# Patient Record
Sex: Male | Born: 1945
Health system: Southern US, Community
[De-identification: ages and names within clinical notes are randomized; demographics above are authoritative.]

## PROBLEM LIST (undated history)

## (undated) DIAGNOSIS — I10 Essential (primary) hypertension: Secondary | ICD-10-CM

## (undated) DIAGNOSIS — J189 Pneumonia, unspecified organism: Secondary | ICD-10-CM

## (undated) DIAGNOSIS — K2270 Barrett's esophagus without dysplasia: Secondary | ICD-10-CM

## (undated) DIAGNOSIS — N529 Male erectile dysfunction, unspecified: Secondary | ICD-10-CM

## (undated) DIAGNOSIS — K219 Gastro-esophageal reflux disease without esophagitis: Secondary | ICD-10-CM

## (undated) DIAGNOSIS — I340 Nonrheumatic mitral (valve) insufficiency: Secondary | ICD-10-CM

## (undated) DIAGNOSIS — E785 Hyperlipidemia, unspecified: Secondary | ICD-10-CM

## (undated) DIAGNOSIS — Z9889 Other specified postprocedural states: Secondary | ICD-10-CM

## (undated) HISTORY — PX: COLONOSCOPY: SHX174

## (undated) HISTORY — PX: CARDIAC CATHETERIZATION: SHX172

---

## 1998-09-30 DIAGNOSIS — J189 Pneumonia, unspecified organism: Secondary | ICD-10-CM

## 1998-09-30 HISTORY — DX: Pneumonia, unspecified organism: J18.9

## 2000-09-30 DIAGNOSIS — K2270 Barrett's esophagus without dysplasia: Secondary | ICD-10-CM

## 2000-09-30 HISTORY — DX: Barrett's esophagus without dysplasia: K22.70

## 2012-11-27 ENCOUNTER — Other Ambulatory Visit: Payer: Self-pay | Admitting: Cardiology

## 2012-12-01 NOTE — H&P (Signed)
Patient: James [name], James [name] Provider: Donato Schultz, MD  DOB: [birth year] Age: 67 Y Sex: Male Date: 11/26/2012  Phone: [phone]   Address: [address] , [place], [place]  Pcp: Jonny Ruiz GRIFFIN       Subjective:     CC:    1. MS/echo f/u.        HPI:  General:  67 year old with severe mitral valve prolapse, posterior leaflet, moderate to severe mitral regurgitation anterior directed jet with normal EF, moderately to severely dilated left atrium here for followup discussion of echocardiogram. Based upon these findings, I do want to perform transesophageal echocardiogram. See below. We have gone over risks and benefits such as esophageal perforation. I would also like to discuss further mitral valve repair as we did today with surgery. He still is able to walk golf course without any difficulty. His wife is here with him and does state that he has been somewhat more fatigued recently, goes to bed early. He's not having excessive dyspnea. He occasionally will feel fluttering sensation. He has not been diagnosed previously with atrial fibrillation. No prior strokes..        Medical History: Hypertension, Hyperlipidemia, chronic gastroesophageal reflux disease with Barrett's esophagitis, diagnosed 2009, Mitral valve prolapse with regurgitation, Erectile dysfunction.        Family History: Father: deceased 85 yrs choked Mother: deceased 19 yrs hypertension, CHF, COPD Brother 1: alive Sister 1: deceased domestic violence 2 son(s) , 1 daughter(s) .        Social History:  General:  History of smoking  cigarettes: Never smoked no Smoking.  Alcohol: 4 beer/wine a day.  Exercise: walk and hike and walks golf course.  Occupation: retired, Armed forces logistics/support/administrative officer.  Marital Status: Married.  Grandkids in [place]. Now have townhome in [place].       Medications: Prilosec OTC 20 MG Tablet Delayed Release 1 tablet Once a day, Multivitamin . Tablet 1 tablet Once daily, Fish Oil 1000  MG Capsule 1 capsule with a meal Once a day, Co Q-10 200 MG Capsule 1 capsule with a meal Once a day, Viagra 50 MG Tablet 1 tablet as needed Once a day, Amlodipine Besylate 10 MG Tablet 1 tablet Once a day, Benazepril HCl 20 MG Tablet 1 tablet once a day, Hydrochlorothiazide 12.5 MG Capsule 1 capsule Once a day, Simvastatin 20 MG Tablet 1 tablet every evening Once a day, Aspirin 81 MG Tablet Chewable 1 tablet Once a day, Medication List reviewed and reconciled with the patient       Allergies: N.K.D.A.      Objective:     Vitals: Wt 190.2, Wt change .2 lb, Ht 71, BMI 26.52, Pulse sitting 78, BP sitting 134/74.       Examination:  Cardiology, General:  GENERAL APPEARANCE: pleasant, NAD.  JVD: flat.  HEART SOUNDS: regular, normal S1, S2, no S3 or S4.  MURMUR: 3/6 HSM apex.        Assessment:     Assessment:  1. Mitral valve regurgitation - 424.0 (Primary)  2. Hypertension, essential - 401.9  3. Hyperlipidemia - 272.4    Plan:     1. Mitral valve regurgitation  LAB: Basic Metabolic Creat 1.08    GLUCOSE 94 70-99 - mg/dL    BUN 20 4-78 - mg/dL    CREATININE 2.95 6.21-3.08 - mg/dl    eGFR (NON-AFRICAN AMERICAN) 68 >60 - calc    eGFR (AFRICAN AMERICAN) 83 >60 - calc    SODIUM 137 136-145 -  mmol/L    POTASSIUM 3.6 3.5-5.5 - mmol/L    CHLORIDE 102 98-107 - mmol/L    C02 26 22-32 - mmol/L    ANION GAP 12.9 6.0-20.0 - mmol/L    CALCIUM 9.4 8.6-10.3 - mg/dL     [facility] 16/07/9603 05:40:08 PM > ok for TEE     LAB: CBC with Diff Hg 14.2     WBC 6.4 4.0-11.0 - K/ul     RBC 4.16 4.20-5.80 - M/uL L    HGB 14.2 13.0-17.0 - g/dL     HCT 54.0 98.1-19.1 - %     MCH 34.1 27.0-33.0 - pg H    MPV 10.2 7.5-10.7 - fL     MCV 95.9 80.0-94.0 - fL H    MCHC 35.6 32.0-36.0 - g/dL     RDW 47.8 29.5-62.1 - %     NRBC# 0.02 -     PLT 215 150-400 - K/uL     NEUT % 69.7 43.3-71.9 - %     NRBC% 0.30 - %     LYMPH% 23.8 16.8-43.5 - %     MONO % 5.1 4.6-12.4 - %     EOS % 0.8 0.0-7.8 -  %     BASO % 0.6 0.0-1.0 - %     NEUT # 4.5 1.9-7.2 - K/uL     LYMPH# 1.50 1.10-2.70 - K/uL     MONO # 0.3 0.3-0.8 - K/uL     EOS # 0.1 0.0-0.6 - K/uL     BASO # 0.0 0.0-0.1 - K/uL      SKAINS,MARK 11/26/2012 05:39:54 PM > ok for TEE     LAB: PT (Prothrombin Time) (308657) normal     Prothrombin Time 10.0 9.1-12.0 - SEC     INR 0.9 0.8-1.2 -      SKAINS,MARK 11/27/2012 04:41:27 PM >      I decided to proceed with transesophageal echocardiogram to further illustrate mitral valve. Based upon TEE, we will likely have him visit with Dr. Cornelius Moras of cardiothoracic surgery. Prolapse is severe, regurgitation is moderate to severe.          Procedures:  Venipuncture:  Venipuncture: Smith,Michele 11/26/2012 02:21:40 PM > , performed in right arm.        Immunizations:        Labs:        Procedure Codes: 84696 ECL BMP, 85025 ECL CBC PLATELET DIFF, 29528 BLOOD COLLECTION ROUTINE VENIPUNCTURE       Preventive:         Follow Up: 3 Months      Provider: Donato Schultz, MD  Patient: James [name], James [name] DOB: 10-13-1945 Date: 11/26/2012

## 2012-12-03 ENCOUNTER — Ambulatory Visit (HOSPITAL_COMMUNITY)
Admission: RE | Admit: 2012-12-03 | Discharge: 2012-12-03 | Disposition: A | Discharge status: Home / Self Care | Payer: Medicare Other | Source: Ambulatory Visit | Attending: Cardiology | Admitting: Cardiology

## 2012-12-03 ENCOUNTER — Encounter (HOSPITAL_COMMUNITY): Payer: Self-pay | Admitting: *Deleted

## 2012-12-03 ENCOUNTER — Encounter (HOSPITAL_COMMUNITY)
Admission: RE | Disposition: A | Discharge status: Home / Self Care | Payer: Self-pay | Source: Ambulatory Visit | Attending: Cardiology

## 2012-12-03 DIAGNOSIS — K219 Gastro-esophageal reflux disease without esophagitis: Secondary | ICD-10-CM

## 2012-12-03 DIAGNOSIS — I10 Essential (primary) hypertension: Secondary | ICD-10-CM

## 2012-12-03 DIAGNOSIS — I059 Rheumatic mitral valve disease, unspecified: Secondary | ICD-10-CM | POA: Diagnosis present

## 2012-12-03 DIAGNOSIS — K2270 Barrett's esophagus without dysplasia: Secondary | ICD-10-CM

## 2012-12-03 DIAGNOSIS — I340 Nonrheumatic mitral (valve) insufficiency: Secondary | ICD-10-CM

## 2012-12-03 DIAGNOSIS — E785 Hyperlipidemia, unspecified: Secondary | ICD-10-CM

## 2012-12-03 HISTORY — DX: Male erectile dysfunction, unspecified: N52.9

## 2012-12-03 HISTORY — DX: Hyperlipidemia, unspecified: E78.5

## 2012-12-03 HISTORY — DX: Gastro-esophageal reflux disease without esophagitis: K21.9

## 2012-12-03 HISTORY — DX: Barrett's esophagus without dysplasia: K22.70

## 2012-12-03 HISTORY — DX: Nonrheumatic mitral (valve) insufficiency: I34.0

## 2012-12-03 HISTORY — DX: Essential (primary) hypertension: I10

## 2012-12-03 HISTORY — PX: TEE WITHOUT CARDIOVERSION: SHX5443

## 2012-12-03 SURGERY — ECHOCARDIOGRAM, TRANSESOPHAGEAL
Anesthesia: Moderate Sedation

## 2012-12-03 MED ORDER — LIDOCAINE VISCOUS 2 % MT SOLN
OROMUCOSAL | Status: AC
  Filled 2012-12-03: qty 15

## 2012-12-03 MED ORDER — FENTANYL CITRATE 0.05 MG/ML IJ SOLN
INTRAMUSCULAR | Status: DC | PRN
  Administered 2012-12-03: 50 ug via INTRAVENOUS
  Administered 2012-12-03: 25 ug via INTRAVENOUS

## 2012-12-03 MED ORDER — FENTANYL CITRATE 0.05 MG/ML IJ SOLN
INTRAMUSCULAR | Status: AC
  Filled 2012-12-03: qty 2

## 2012-12-03 MED ORDER — BUTAMBEN-TETRACAINE-BENZOCAINE 2-2-14 % EX AERO
INHALATION_SPRAY | CUTANEOUS | Status: DC | PRN
  Administered 2012-12-03: 2 via TOPICAL

## 2012-12-03 MED ORDER — MIDAZOLAM HCL 5 MG/ML IJ SOLN
INTRAMUSCULAR | Status: AC
  Filled 2012-12-03: qty 2

## 2012-12-03 MED ORDER — MIDAZOLAM HCL 10 MG/2ML IJ SOLN
INTRAMUSCULAR | Status: DC | PRN
  Administered 2012-12-03 (×2): 2 mg via INTRAVENOUS

## 2012-12-03 MED ORDER — SODIUM CHLORIDE 0.9 % IV SOLN
INTRAVENOUS | Status: DC
  Administered 2012-12-03: 10:00:00 via INTRAVENOUS

## 2012-12-03 MED ORDER — LIDOCAINE VISCOUS 2 % MT SOLN
OROMUCOSAL | Status: DC | PRN
  Administered 2012-12-03: 10 mL via OROMUCOSAL

## 2012-12-03 NOTE — Progress Notes (Signed)
*  PRELIMINARY RESULTS* Echocardiogram TEE has been performed.  James [name] 12/03/2012, 11:22 AM

## 2012-12-03 NOTE — H&P (View-Only) (Signed)
  Patient: James [name], James [name] Provider: Mark Skains, MD  DOB: 12/03/1945 Age: 67 Y Sex: Male Date: 11/26/2012  Phone: [phone]   Address: [address] , [place], [place]  Pcp: JOHN GRIFFIN       Subjective:     CC:    1. MS/echo f/u.        HPI:  General:  67-year-old with severe mitral valve prolapse, posterior leaflet, moderate to severe mitral regurgitation anterior directed jet with normal EF, moderately to severely dilated left atrium here for followup discussion of echocardiogram. Based upon these findings, I do want to perform transesophageal echocardiogram. See below. We have gone over risks and benefits such as esophageal perforation. I would also like to discuss further mitral valve repair as we did today with surgery. He still is able to walk golf course without any difficulty. His wife is here with him and does state that he has been somewhat more fatigued recently, goes to bed early. He's not having excessive dyspnea. He occasionally will feel fluttering sensation. He has not been diagnosed previously with atrial fibrillation. No prior strokes..        Medical History: Hypertension, Hyperlipidemia, chronic gastroesophageal reflux disease with Barrett's esophagitis, diagnosed 2009, Mitral valve prolapse with regurgitation, Erectile dysfunction.        Family History: Father: deceased 69 yrs choked Mother: deceased 78 yrs hypertension, CHF, COPD Brother 1: alive Sister 1: deceased domestic violence 2 son(s) , 1 daughter(s) .        Social History:  General:  History of smoking  cigarettes: Never smoked no Smoking.  Alcohol: 4 beer/wine a day.  Exercise: walk and hike and walks golf course.  Occupation: retired, sales textile yarns.  Marital Status: Married.  Grandkids in [place]. Now have townhome in [place].       Medications: Prilosec OTC 20 MG Tablet Delayed Release 1 tablet Once a day, Multivitamin . Tablet 1 tablet Once daily, Fish Oil 1000  MG Capsule 1 capsule with a meal Once a day, Co Q-10 200 MG Capsule 1 capsule with a meal Once a day, Viagra 50 MG Tablet 1 tablet as needed Once a day, Amlodipine Besylate 10 MG Tablet 1 tablet Once a day, Benazepril HCl 20 MG Tablet 1 tablet once a day, Hydrochlorothiazide 12.5 MG Capsule 1 capsule Once a day, Simvastatin 20 MG Tablet 1 tablet every evening Once a day, Aspirin 81 MG Tablet Chewable 1 tablet Once a day, Medication List reviewed and reconciled with the patient       Allergies: N.[name].D.A.      Objective:     Vitals: Wt 190.2, Wt change .2 lb, Ht 71, BMI 26.52, Pulse sitting 78, BP sitting 134/74.       Examination:  Cardiology, General:  GENERAL APPEARANCE: pleasant, NAD.  JVD: flat.  HEART SOUNDS: regular, normal S1, S2, no S3 or S4.  MURMUR: 3/6 HSM apex.        Assessment:     Assessment:  1. Mitral valve regurgitation - 424.0 (Primary)  2. Hypertension, essential - 401.9  3. Hyperlipidemia - 272.4    Plan:     1. Mitral valve regurgitation  LAB: Basic Metabolic Creat 1.08    GLUCOSE 94 70-99 - mg/dL    BUN 20 6-26 - mg/dL    CREATININE 1.08 0.60-1.30 - mg/dl    eGFR (NON-AFRICAN AMERICAN) 68 >60 - calc    eGFR (AFRICAN AMERICAN) 83 >60 - calc    SODIUM 137 136-145 -   mmol/L    POTASSIUM 3.6 3.5-5.5 - mmol/L    CHLORIDE 102 98-107 - mmol/L    C02 26 22-32 - mmol/L    ANION GAP 12.9 6.0-20.0 - mmol/L    CALCIUM 9.4 8.6-10.3 - mg/dL     SKAINS,MARK 11/26/2012 05:40:08 PM > ok for TEE     LAB: CBC with Diff Hg 14.2     WBC 6.4 4.0-11.0 - [name]/ul     RBC 4.16 4.20-5.80 - M/uL L    HGB 14.2 13.0-17.0 - g/dL     HCT 39.9 39.0-52.0 - %     MCH 34.1 27.0-33.0 - pg H    MPV 10.2 7.5-10.7 - fL     MCV 95.9 80.0-94.0 - fL H    MCHC 35.6 32.0-36.0 - g/dL     RDW 12.4 11.5-15.5 - %     NRBC# 0.02 -     PLT 215 150-400 - [name]/uL     NEUT % 69.7 43.3-71.9 - %     NRBC% 0.30 - %     LYMPH% 23.8 16.8-43.5 - %     MONO % 5.1 4.6-12.4 - %     EOS % 0.8 0.0-7.8 -  %     BASO % 0.6 0.0-1.0 - %     NEUT # 4.5 1.9-7.2 - [name]/uL     LYMPH# 1.50 1.10-2.70 - [name]/uL     MONO # 0.3 0.3-0.8 - [name]/uL     EOS # 0.1 0.0-0.6 - [name]/uL     BASO # 0.0 0.0-0.1 - [name]/uL      SKAINS,MARK 11/26/2012 05:39:54 PM > ok for TEE     LAB: PT (Prothrombin Time) (005199) normal     Prothrombin Time 10.0 9.1-12.0 - SEC     INR 0.9 0.8-1.2 -      SKAINS,MARK 11/27/2012 04:41:27 PM >      I decided to proceed with transesophageal echocardiogram to further illustrate mitral valve. Based upon TEE, we will likely have him visit with Dr. Owen of cardiothoracic surgery. Prolapse is severe, regurgitation is moderate to severe.          Procedures:  Venipuncture:  Venipuncture: Smith,Michele 11/26/2012 02:21:40 PM > , performed in right arm.        Immunizations:        Labs:        Procedure Codes: 80048 ECL BMP, 85025 ECL CBC PLATELET DIFF, 36415 BLOOD COLLECTION ROUTINE VENIPUNCTURE       Preventive:         Follow Up: 3 Months      Provider: Mark Skains, MD  Patient: Schowalter, Cleotis [name] DOB: 06/24/1946 Date: 11/26/2012    

## 2012-12-03 NOTE — Interval H&P Note (Signed)
History and Physical Interval Note:  12/03/2012 10:44 AM  James [name]  has presented today for surgery, with the diagnosis of Mitral Reguritation  The various methods of treatment have been discussed with the patient and family. After consideration of risks, benefits and other options for treatment, the patient has consented to  Procedure(s) with comments: TRANSESOPHAGEAL ECHOCARDIOGRAM (TEE) (N/A) - h/p in file cabinet-ja as a surgical intervention .  The patient's history has been reviewed, patient examined, no change in status, stable for surgery.  I have reviewed the patient's chart and labs.  Questions were answered to the patient's satisfaction.     SKAINS, MARK

## 2012-12-03 NOTE — CV Procedure (Signed)
TEE  Severe mitral regurgitation. Posterior severe prolapse with severe MR, eccentric jet. Ruptured chordae noted. Wraps around entire left atrium. Flow is noted into left atrial appendage.   Normal EF.  Moderate LA enlargement. 5.3cm.   Will discuss with TCTS.

## 2012-12-04 ENCOUNTER — Other Ambulatory Visit: Payer: Self-pay

## 2012-12-04 ENCOUNTER — Encounter: Payer: Self-pay | Admitting: Thoracic Surgery (Cardiothoracic Vascular Surgery)

## 2012-12-04 ENCOUNTER — Encounter: Payer: Medicare Other | Admitting: Thoracic Surgery (Cardiothoracic Vascular Surgery)

## 2012-12-04 ENCOUNTER — Institutional Professional Consult (permissible substitution) (INDEPENDENT_AMBULATORY_CARE_PROVIDER_SITE_OTHER): Payer: Medicare Other | Admitting: Thoracic Surgery (Cardiothoracic Vascular Surgery)

## 2012-12-04 VITALS — BP 145/82 | HR 78 | Resp 20 | Ht 71.0 in | Wt 185.0 lb

## 2012-12-04 DIAGNOSIS — I712 Thoracic aortic aneurysm, without rupture, unspecified: Secondary | ICD-10-CM

## 2012-12-04 DIAGNOSIS — I059 Rheumatic mitral valve disease, unspecified: Secondary | ICD-10-CM

## 2012-12-04 NOTE — Progress Notes (Signed)
[address]            James [name] [address]          [phone]     CARDIOTHORACIC SURGERY CONSULTATION REPORT  Referring Provider is James Schultz, MD PCP is James [place], MD  Chief Complaint  Patient presents with  . Mitral Regurgitation    Referral from [name] for surgical eval on severe MR, 2D- ECHO 11/19/12 , TEE 12/03/12    HPI:  Patient is a 67 year old retired white male who has been fairly healthy most of his life but was first told he had a heart murmur more than 35 years ago. He was diagnosed with mitral valve prolapse and mitral regurgitation several years ago, and up until recently he had been followed in [place] with regular echocardiograms. Approximately one year ago he was seen by his cardiologist in [place] and told that the severity of mitral regurgitation have gotten worse and that the size of the heart had also increased. The patient has recently retired and moved [place] and now established care locally with Dr. Valentina [name] and Dr. Anne [name]. Followup transthoracic echocardiogram was performed demonstrating mitral valve prolapse with severe mitral regurgitation. The patient subsequently underwent transesophageal echocardiogram confirmed the presence of severe mitral regurgitation with flail scallop of the posterior leaflet. Left ventricular size and systolic function remains normal. The patient has been referred for elective surgical consultation.  The patient remains relatively active physically. He specifically denies any exertional shortness of breath. He enjoys golf and regularly walks 18 holes of golf caring his own bag. He also enjoys hiking. He has not appreciated any change in his exercise tolerance. He specifically denies any resting shortness of breath, PND, orthopnea, or lower extremity edema. He has not had any chest pains or chest tightness either with activity or at rest. He does have occasional palpitations. He has no  documented history of irregular heart rhythm or atrial fibrillation.  Past Medical History  Diagnosis Date  . Hypertension   . GERD (gastroesophageal reflux disease)   . Barrett esophagus 2002  . Severe mitral regurgitation   . Hyperlipidemia   . Erectile dysfunction     History reviewed. No pertinent past surgical history.  Family History  Problem Relation Age of Onset  . Hypertension Mother   . COPD Mother   . Heart failure Mother     History   Social History  . Marital Status: Married    Spouse Name: N/A    Number of Children: 3  . Years of Education: N/A   Occupational History  . retired     Publishing copy   Social History Main Topics  . Smoking status: Never Smoker   . Smokeless tobacco: Never Used  . Alcohol Use: 12.6 oz/week    7 Glasses of wine, 14 Cans of beer per week  . Drug Use: Not on file  . Sexually Active: Not on file   Other Topics Concern  . Not on file   Social History Narrative  . No narrative on file    No current facility-administered medications for this visit.   No current outpatient prescriptions on file.   Facility-Administered Medications Ordered in Other Visits  Medication Dose Route Frequency Provider Last Rate Last Dose  . 0.9 %  sodium chloride infusion   Intravenous Continuous James Schultz, MD 10 mL/hr at 12/03/12 [phone]  No Known Allergies    Review of Systems:   General:  normal appetite, normal energy, no weight gain, no weight loss, no fever  Cardiac:  no chest pain with exertion, no chest pain at rest, no SOB with exertion, no resting SOB, no PND, no orthopnea, + palpitations, no arrhythmia, no atrial fibrillation, no LE edema, no dizzy spells, no syncope  Respiratory:  no shortness of breath, no home oxygen, no productive cough, no dry cough, no bronchitis, no wheezing, no hemoptysis, no asthma, no pain with inspiration or cough, no sleep apnea, no CPAP at night  GI:   no difficulty swallowing, + reflux, no  frequent heartburn, no hiatal hernia, no abdominal pain, no constipation, no diarrhea, no hematochezia, no hematemesis, no melena  GU:   no dysuria,  no frequency, no urinary tract infection, no hematuria, no enlarged prostate, no kidney stones, no kidney disease  Vascular:  no pain suggestive of claudication, no pain in feet, no leg cramps, no varicose veins, no DVT, no non-healing foot ulcer  Neuro:   no stroke, no TIA's, no seizures, no headaches, no temporary blindness one eye,  no slurred speech, no peripheral neuropathy, no chronic pain, no instability of gait, no memory/cognitive dysfunction  Musculoskeletal: no arthritis, no joint swelling, no myalgias, no difficulty walking, normal mobility   Skin:   no rash, no itching, no skin infections, no pressure sores or ulcerations  Psych:   no anxiety, no depression, no nervousness, no unusual recent stress  Eyes:   no blurry vision, no floaters, no recent vision changes,  wears glasses or contacts  ENT:   no hearing loss, no loose or painful teeth, no dentures, last saw dentist within the past year  Hematologic:  no easy bruising, no abnormal bleeding, no clotting disorder, no frequent epistaxis  Endocrine:  no diabetes, does not check CBG's at home     Physical Exam:   BP 145/82  Pulse 78  Resp 20  Ht 5\' 11"  (1.803 m)  Wt 185 lb (83.915 kg)  BMI 25.81 kg/m2  SpO2 98%  General:  Healthy, well-appearing  HEENT:  Unremarkable   Neck:   no JVD, no bruits, no adenopathy   Chest:   clear to auscultation, symmetrical breath sounds, no wheezes, no rhonchi   CV:   RRR, prominent grade IV/VI holosystolic murmur best at apex w/ radiation across the precordium and to the back  Abdomen:  soft, non-tender, no masses   Extremities:  warm, well-perfused, pulses palpable throughout, no LE edema  Rectal/GU  Deferred  Neuro:   Grossly non-focal and symmetrical throughout  Skin:   Clean and dry, no rashes, no breakdown   Diagnostic  Tests:  TRANSTHORACIC ECHOCARDIOGRAM  Report of transthoracic echocardiogram performed at [facility] cardiology on 11/19/2012 is reviewed. There is mitral valve prolapse severe prolapse involving the posterior leaflet. There is moderate to severe mitral regurgitation directed anteriorly. There is mild tricuspid regurgitation. Left ventricular systolic function is normal with ejection fraction estimated 60-65%. There is moderate concentric left ventricular hypertrophy. There is mildly elevated right ventricular systolic pressure. There is mild aortic root dilatation with the sinus of Valsalva measuring 3.9 cm. There is mild calcification of aortic valve.    Transesophageal Echocardiography  Patient: James [name], James [name] MR #: [id number] Study Date: 12/03/2012 Gender: M Age: 52 Height: 154.9cm Weight: 84.1kg BSA: 1.38m^2 Pt. Status: Room: [facility], Ec ORDERING Skains, Mark SONOGRAPHER [place], [place] cc:  ------------------------------------------------------------ LV EF: 65% - 70%  ------------------------------------------------------------  Study Conclusions  - Left ventricle: The cavity size was normal. Wall thickness was normal. Systolic function was vigorous. The estimated ejection fraction was in the range of 65% to 70%. - Aortic valve: No evidence of vegetation. - Mitral valve: Severe prolapse, involving the medial scallop of the posterior leaflet with evidence of ruptured chordae. Severe regurgitation directed eccentrically and anteriorly. - Left atrium: The atrium was moderately (5.3cm) dilated. No evidence of thrombus in the atrial cavity or appendage. No evidence of thrombus in the appendage. No evidence of thrombus in the appendage. - Right atrium: No evidence of thrombus in the atrial cavity or appendage. - Atrial septum: No defect or patent foramen ovale was identified. - Tricuspid valve: No evidence of vegetation. - Pulmonic valve: No  evidence of vegetation. - Superior vena cava: The study excluded a thrombus. Transesophageal echocardiography. 2D and color Doppler. Height: Height: 154.9cm. Height: 61in. Weight: Weight: 84.1kg. Weight: 185lb. Body mass index: BMI: 35kg/m^2. Body surface area: BSA: 1.16m^2. Blood pressure: 145/73. Patient status: Inpatient. Location: Endoscopy.  ------------------------------------------------------------  ------------------------------------------------------------ Left ventricle: The cavity size was normal. Wall thickness was normal. Systolic function was vigorous. The estimated ejection fraction was in the range of 65% to 70%.  ------------------------------------------------------------ Aortic valve: Structurally normal valve. Cusp separation was normal. No evidence of vegetation. Doppler: No regurgitation.  ------------------------------------------------------------ Aorta: The aorta was normal, not dilated, and non-diseased.  ------------------------------------------------------------ Mitral valve: Severe prolapse, involving the medial scallop of the posterior leaflet with evidence of ruptured chordae. Doppler: Severe regurgitation directed eccentrically and anteriorly. Regurgitant jet fills left atrial appendage.  ------------------------------------------------------------ Left atrium: The atrium was moderately (5.3cm) dilated. No evidence of thrombus in the atrial cavity or appendage. No evidence of thrombus in the appendage. No evidence of thrombus in the appendage. The appendage was of normal size.  ------------------------------------------------------------ Atrial septum: No defect or patent foramen ovale was identified.  ------------------------------------------------------------ Right ventricle: The cavity size was normal. Wall thickness was normal. Systolic function was normal.  ------------------------------------------------------------ Pulmonic valve:  Structurally normal valve. Cusp separation was normal. No evidence of vegetation.  ------------------------------------------------------------ Tricuspid valve: Structurally normal valve. Leaflet separation was normal. No evidence of vegetation. Doppler: Trivial regurgitation.  ------------------------------------------------------------ Right atrium: The atrium was normal in size. No evidence of thrombus in the atrial cavity or appendage.  ------------------------------------------------------------ Pericardium: The pericardium was normal in appearance. There was no pericardial effusion.  ------------------------------------------------------------ Systemic veins: Superior vena cava: The study excluded a thrombus.  ------------------------------------------------------------ Post procedure conclusions Ascending Aorta:  - The aorta was normal, not dilated, and non-diseased.  ------------------------------------------------------------ Prepared and Electronically Authenticated by  James [name] 2014-03-06T16:19:[id number]    Impression:  The patient has mitral valve prolapse with a large flail segment of the posterior leaflet and severe mitral regurgitation. Left ventricular size and systolic function remains normal. The patient remains asymptomatic. Options include continued medical therapy with close followup versus proceeding with elective mitral valve repair.  Based upon extensive review of the patient's transesophageal echocardiogram I feel there is very high likelihood his valve should be repairable with a durable result.  Patient may be a very good candidate for minimally invasive approach for surgery, although diagnostic cardiac catheterization has yet to be performed.   Plan:  The rationale for elective mitral valve repair surgery has been explained, including a comparison between surgery and continued medical therapy with close follow-up.  The likelihood of successful and  durable valve repair has been discussed with particular reference to the findings of their recent echocardiogram.  Based upon these  findings and previous experience, I have quoted them a greater than 95 percent likelihood of successful valve repair.  Alternative surgical approaches have been discussed, including a comparison between conventional sternotomy and minimally-invasive techniques.  The relative risks and benefits of each have been reviewed as they pertain to the patient's specific circumstances, and all of their questions have been addressed.  The patient is very interested in proceeding with elective mitral valve repair in the near future. We will discuss with Dr. Anne [name] possibility of proceeding with diagnostic cardiac catheterization. We will plan to see the patient back in 2 weeks after his catheterization as been performed. We will also obtain CT scan to gram of the chest to rule out significant aneurysmal dilatation of the aorta because of the large size of the patient's aortic root seen on echocardiogram.    James Decent. Cornelius Moras, MD 12/04/2012 3:27 PM

## 2012-12-07 ENCOUNTER — Encounter (HOSPITAL_COMMUNITY): Payer: Self-pay | Admitting: Cardiology

## 2012-12-08 ENCOUNTER — Other Ambulatory Visit: Payer: Self-pay | Admitting: Cardiology

## 2012-12-10 NOTE — H&P (Signed)
Patient: James [name], James [name] Account Number: [id number] Provider: Donato Schultz, MD  DOB: [birth year] Age: 67 Y Sex: Male Date: 12/08/2012  Phone: [phone]   Address: [address] , [place], [id number]  Pcp: Jonny Ruiz GRIFFIN          1. MS/per MS for cath work up.        HPI:  General:  67 year old with severe mitral valve prolapse, posterior leaflet, moderate to severe mitral regurgitation anterior directed jet with normal EF, moderately to severely dilated left atrium here for followup discussion of echocardiogram. Based upon these findings, I do want to perform transesophageal echocardiogram. See below. We have gone over risks and benefits such as esophageal perforation. I would also like to discuss further mitral valve repair as we did today with surgery. He still is able to walk golf course without any difficulty. His wife is here with him and does state that he has been somewhat more fatigued recently, goes to bed early. He's not having excessive dyspnea. He occasionally will feel fluttering sensation. He has not been diagnosed previously with atrial fibrillation. No prior strokes. Has seen Dr. Cornelius Moras who has reviewed the transesophageal echocardiogram. He's planning for surgery. asking for preoperative cardiac catheterization..        ROS:  No bleeding issues, syncope, no orthopnea, no PND.       Medical History: Hypertension, Hyperlipidemia, chronic gastroesophageal reflux disease with Barrett's esophagitis, diagnosed 2009, Mitral valve prolapse with regurgitation, Erectile dysfunction.        Family History: Father: deceased 73 yrs, chokedMother: deceased 5 yrs, hypertension, CHF, COPDBrother 1: aliveSister 1: deceased, domestic violence2 son(s) , 1 daughter(s) .        Social History:  General:  History of smoking  cigarettes: Never smoked no Smoking.  Alcohol: 4 beer/wine a day.  Exercise: walk and hike and walks golf course.  Occupation: retired, Armed forces logistics/support/administrative officer.   Marital Status: Married.  Grandkids in [place]. Now have townhome in [place].       Medications: Taking Prilosec OTC 20 MG Tablet Delayed Release 1 tablet Once a day, Taking Multivitamin . Tablet 1 tablet Once daily, Taking Fish Oil 1000 MG Capsule 1 capsule with a meal Once a day, Taking Co Q-10 200 MG Capsule 1 capsule with a meal Once a day, Taking Viagra 50 MG Tablet 1 tablet as needed Once a day, Taking Amlodipine Besylate 10 MG Tablet 1 tablet Once a day, Taking Benazepril HCl 20 MG Tablet 1 tablet once a day, Taking Hydrochlorothiazide 12.5 MG Capsule 1 capsule Once a day, Taking Simvastatin 20 MG Tablet 1 tablet every evening Once a day, Taking Aspirin 81 MG Tablet Chewable 1 tablet Once a day, Taking Flax Seed Oil 1000 MG Capsule , Medication List reviewed and reconciled with the patient       Allergies: N.K.D.A.          Vitals: Wt 190, Wt change -.2 lb, Ht 71, BMI 26.50, Pulse sitting 76, BP sitting 130/70.       Examination:  General Examination:  GENERAL APPEARANCE alert, oriented, NAD, pleasant.  SKIN: normal, no rash.  HEAD: [place]/AT.  EYES: PERRL, EOMI, Conjunctiva clear, no drainage.  ORAL CAVITY: moist mucous membranes, no significant abnormalities.  NECK: no lymphadenopathy, no bruits, no JVD.  LUNGS: clear to auscultation bilaterally, no wheezes, rhonchi, rales, regular breathing rate and effort.  HEART: regular rate and rhythm, no S3, S4, 3/6 holosystolic murmur at apex, no rub, point of maximul impulse (  PMI) normal.  ABDOMEN: soft, non-tender/non-distended, bowel sounds present, no masses palpated, no bruit.  EXTREMITIES: no clubbing, no edema, pulses 2 plus bilaterally.  PERIPHERAL PULSES: normal (2+) bilaterally.  LYMPH NODES: No cervical adenopathy.  PSYCH Normal affect.  Exam performed at original consult visit.           Assessment:  1. Mitral valve regurgitation - 424.0 (Primary)  2. Hyperlipidemia - 272.4  3. Hypertension, essential - 401.9         1. Mitral valve regurgitation  LAB: PT (Prothrombin Time) (478295)    Prothrombin Time 10.1  9.1-12.0 - SEC   INR 0.9  0.8-1.2 -    LAB: Basic Metabolic    GLUCOSE 118 H 70-99 - mg/dL   BUN 19  6-21 - mg/dL   CREATININE 3.08  6.57-8.46 - mg/dl   eGFR (NON-AFRICAN AMERICAN) 68  >60 - calc   eGFR (AFRICAN AMERICAN) 82  >60 - calc   SODIUM 139  136-145 - mmol/L   POTASSIUM 3.6  3.5-5.5 - mmol/L   CHLORIDE 104  98-107 - mmol/L   C02 26  22-32 - mmol/L   ANION GAP 12.6  6.0-20.0 - mmol/L   CALCIUM 9.5  8.6-10.3 - mg/dL   LAB: CBC with Diff    WBC 6.1  4.0-11.0 - K/ul   RBC 4.21  4.20-5.80 - M/uL   HGB 14.1  13.0-17.0 - g/dL   HCT 96.2  95.2-84.1 - %   MCH 33.5 H 27.0-33.0 - pg   MPV 9.5  7.5-10.7 - fL   MCV 95.4 H 80.0-94.0 - fL   MCHC 35.1  32.0-36.0 - g/dL   RDW 32.4  40.1-02.7 - %   NRBC# 0.00  -    PLT 181  150-400 - K/uL   NEUT % 66.8  43.3-71.9 - %   NRBC% 0.00  - %   LYMPH% 25.4  16.8-43.5 - %   MONO % 6.3  4.6-12.4 - %   EOS % 0.7  0.0-7.8 - %   BASO % 0.8  0.0-1.0 - %   NEUT # 4.1  1.9-7.2 - K/uL   LYMPH# 1.50  1.10-2.70 - K/uL   MONO # 0.4  0.3-0.8 - K/uL   EOS # 0.0  0.0-0.6 - K/uL   BASO # 0.1  0.0-0.1 - K/uL    Notes: Risks and benefits of cardiac catheterization have been reviewed including risk of stroke, heart attack, death, bleeding, renal impariment and arterial damage. There was ample oppurtuny to answer questions. Alternatives were discussed. Patient understands and wishes to proceed.Right and left heart cath. LEFT groin with abd aorta.        Procedures:  Venipuncture:  Venipuncture: Gasanova,Svetlana 12/08/2012 01:57:07 PM > , performed in right arm.           Procedure Codes: 25366 BLOOD COLLECTION ROUTINE VENIPUNCTURE, 80048 ECL BMP, 85025 ECL CBC PLATELET DIFF       Follow Up: post cath      Provider: Donato Schultz, MD  Patient: James [name], James [name] DOB: June 30, 1946 Date: 12/08/2012

## 2012-12-14 ENCOUNTER — Ambulatory Visit
Admission: RE | Admit: 2012-12-14 | Discharge: 2012-12-14 | Disposition: A | Discharge status: Home / Self Care | Payer: Medicare Other | Source: Ambulatory Visit | Attending: Thoracic Surgery (Cardiothoracic Vascular Surgery) | Admitting: Thoracic Surgery (Cardiothoracic Vascular Surgery)

## 2012-12-14 ENCOUNTER — Other Ambulatory Visit: Payer: Medicare Other

## 2012-12-14 MED ORDER — IOHEXOL 300 MG/ML  SOLN
100.0000 mL | Freq: Once | INTRAMUSCULAR | Status: AC | PRN
  Administered 2012-12-14: 100 mL via INTRAVENOUS

## 2012-12-15 ENCOUNTER — Encounter (HOSPITAL_BASED_OUTPATIENT_CLINIC_OR_DEPARTMENT_OTHER)
Admission: RE | Disposition: A | Discharge status: Home / Self Care | Payer: Self-pay | Source: Ambulatory Visit | Attending: Cardiology

## 2012-12-15 ENCOUNTER — Encounter (HOSPITAL_BASED_OUTPATIENT_CLINIC_OR_DEPARTMENT_OTHER): Payer: Self-pay | Admitting: *Deleted

## 2012-12-15 ENCOUNTER — Inpatient Hospital Stay (HOSPITAL_BASED_OUTPATIENT_CLINIC_OR_DEPARTMENT_OTHER)
Admission: RE | Admit: 2012-12-15 | Discharge: 2012-12-15 | Disposition: A | Discharge status: Home / Self Care | Payer: Medicare Other | Source: Ambulatory Visit | Attending: Cardiology | Admitting: Cardiology

## 2012-12-15 DIAGNOSIS — I10 Essential (primary) hypertension: Secondary | ICD-10-CM | POA: Diagnosis present

## 2012-12-15 DIAGNOSIS — E785 Hyperlipidemia, unspecified: Secondary | ICD-10-CM | POA: Insufficient documentation

## 2012-12-15 DIAGNOSIS — I340 Nonrheumatic mitral (valve) insufficiency: Secondary | ICD-10-CM | POA: Diagnosis present

## 2012-12-15 DIAGNOSIS — I059 Rheumatic mitral valve disease, unspecified: Secondary | ICD-10-CM | POA: Insufficient documentation

## 2012-12-15 LAB — POCT I-STAT 3, ART BLOOD GAS (G3+)
Bicarbonate: 21.5 mEq/L (ref 20.0–24.0)
pH, Arterial: 7.401 (ref 7.350–7.450)
pO2, Arterial: 65 mmHg — ABNORMAL LOW (ref 80.0–100.0)

## 2012-12-15 LAB — POCT I-STAT 3, VENOUS BLOOD GAS (G3P V)
Acid-base deficit: 2 mmol/L (ref 0.0–2.0)
Bicarbonate: 22.8 mEq/L (ref 20.0–24.0)
pCO2, Ven: 40 mmHg — ABNORMAL LOW (ref 45.0–50.0)
pO2, Ven: 38 mmHg (ref 30.0–45.0)

## 2012-12-15 SURGERY — JV LEFT AND RIGHT HEART CATHETERIZATION WITH CORONARY ANGIOGRAM

## 2012-12-15 MED ORDER — SODIUM CHLORIDE 0.9 % IJ SOLN: 3.0000 mL | Freq: Two times a day (BID) | INTRAMUSCULAR | Status: DC

## 2012-12-15 MED ORDER — SODIUM CHLORIDE 0.9 % IJ SOLN: 3.0000 mL | INTRAMUSCULAR | Status: DC | PRN

## 2012-12-15 MED ORDER — ONDANSETRON HCL 4 MG/2ML IJ SOLN: 4.0000 mg | Freq: Four times a day (QID) | INTRAMUSCULAR | Status: DC | PRN

## 2012-12-15 MED ORDER — ACETAMINOPHEN 325 MG PO TABS: 650.0000 mg | ORAL_TABLET | ORAL | Status: DC | PRN

## 2012-12-15 MED ORDER — SODIUM CHLORIDE 0.9 % IV SOLN: 250.0000 mL | INTRAVENOUS | Status: DC | PRN

## 2012-12-15 MED ORDER — SODIUM CHLORIDE 0.9 % IV SOLN
INTRAVENOUS | Status: DC
  Administered 2012-12-15: 09:00:00 via INTRAVENOUS

## 2012-12-15 MED ORDER — ASPIRIN 81 MG PO CHEW
324.0000 mg | CHEWABLE_TABLET | ORAL | Status: AC
  Administered 2012-12-15: 324 mg via ORAL

## 2012-12-15 MED ORDER — DIAZEPAM 5 MG PO TABS
5.0000 mg | ORAL_TABLET | ORAL | Status: AC
  Administered 2012-12-15: 5 mg via ORAL

## 2012-12-15 MED ORDER — SODIUM CHLORIDE 0.9 % IV SOLN: 1.0000 mL/kg/h | INTRAVENOUS | Status: DC

## 2012-12-15 NOTE — Progress Notes (Signed)
Bedrest begins @ 1110, tegaderm dressing applied to left groin by James [name], site level 0.

## 2012-12-15 NOTE — CV Procedure (Signed)
CARDIAC CATHETERIZATION  PROCEDURE:  Right and left heart catheterization with selective coronary angiography, left ventriculogram, cardiac outputs, selective oxygen saturations. Abdominal aortogram.  INDICATIONS:  67 year old with severe mitral regurgitation awaiting surgery.  The risks, benefits, and details of the procedure were explained to the patient, including stroke, heart attack, death, renal impairment, arterial damage, bleeding.  The patient verbalized understanding and wanted to proceed.  Informed written consent was obtained.  PROCEDURE TECHNIQUE:  After Xylocaine anesthesia and visualization of the femoral head via fluoroscopy, a 26F sheath was placed in the right femoral artery using the modified Seldinger technique.  A 7 French sheath was inserted into the right femoral vein using the modified Seldinger technique. Left coronary angiography was done using a Judkins L4 catheter.  Right coronary angiography was done using a Judkins R4 catheter. Multiple views with hand injection of Omnipaque were obtained. Left ventriculography was done using a pigtail catheter in the RAO position with power injection. Abdominal aortogram been performed. Power injection. Right heart catheterization was performed with a balloon tipped Swan-Ganz catheter traversing the right-sided heart structures to the wedge position. Oxygen saturation was drawn in the pulmonary artery as well as femoral artery. Hemodynamics were obtained. Cardiac outputs were obtained utilizing the Fick method. Following the procedure, sheaths were removed and patient was hemodynamically stable.   CONTRAST:  Total of 90 ml.  FLOUROSCOPY TIME: 2.8 minutes.  COMPLICATIONS:  None.    HEMODYNAMICS:    Right atrium (RA): 9/6/5 mmHg Right ventricle (RV): 29/6/10 mmHg Pulmonary artery (PA): 24/9/16 mmHg Pulmonary capillary wedge pressure (PCWP): 15/12/10 mmHg  Cardiac output: Fick 5.8 liters/min Cardiac index: Fick 2.8.  PA saturation:  70 % FA saturation: 93 %   Aortic pressure: 108/57/81 mmHg LV pressure: 108 mmHg systolic; LVEDP 16 mmHg.   There was no gradient between the left ventricle and aorta.    ANGIOGRAPHIC DATA:    Left main: No angiographically significant disease. Branches into LAD, high diagonal, ramus, circumflex.  Left anterior descending (LAD): No angiographically significant disease. High diagonal branch. Wraps around the apex.  Circumflex artery (CIRC): No angiographically significant disease, high ramus branch, one obtuse marginal branch.  Right coronary artery (RCA): Dominant vessel giving rise to the posterior descending artery. No angiographically significant disease.  LEFT VENTRICULOGRAM:  Left ventricular angiogram was done in the 30 RAO projection and revealed normal left ventricular wall motion and systolic function with an estimated ejection fraction of 70%. 4+ mitral regurgitation, severe. Abdominal aortogram-no renal artery stenosis, no abdominal aortic aneurysm, normal iliacs, normal femoral arteries.  IMPRESSIONS:  1. No angiographically significant coronary artery disease. 2. Normal left ventricular systolic function.  LVEDP 16 mmHg.  Ejection fraction 70%. 3. Normal right-sided heart pressures. 4. Normal cardiac output. 5.  Severe mitral regurgitation. 6.  No abdominal aortic aneurysm, no renal artery stenosis, and normal iliac arteries, normal femoral arteries.  RECOMMENDATION:  I will relay to Dr. Cornelius Moras. Await surgery.

## 2012-12-16 NOTE — H&P (Signed)
Patient: James [name], Solum Account Number: [id number]  Provider: Donato Schultz, MD   DOB: 08/05/1946 Age: 67 Y Sex: Male  Date: 12/08/2012   Phone: [phone]    Address: [address] , [place], [id number]   Pcp: Jonny Ruiz GRIFFIN           1. MS/per MS for cath work up.        HPI:  General:  67 year old with severe mitral valve prolapse, posterior leaflet, moderate to severe mitral regurgitation anterior directed jet with normal EF, moderately to severely dilated left atrium here for followup discussion of echocardiogram. Based upon these findings, I do want to perform transesophageal echocardiogram. See below. We have gone over risks and benefits such as esophageal perforation. I would also like to discuss further mitral valve repair as we did today with surgery. He still is able to walk golf course without any difficulty. His wife is here with him and does state that he has been somewhat more fatigued recently, goes to bed early. He's not having excessive dyspnea. He occasionally will feel fluttering sensation. He has not been diagnosed previously with atrial fibrillation. No prior strokes. Has seen Dr. Cornelius Moras who has reviewed the transesophageal echocardiogram. He's planning for surgery. asking for preoperative cardiac catheterization..        ROS:  No bleeding issues, syncope, no orthopnea, no PND.        Medical History: Hypertension, Hyperlipidemia, chronic gastroesophageal reflux disease with Barrett's esophagitis, diagnosed 2009, Mitral valve prolapse with regurgitation, Erectile dysfunction.        Family History: Father: deceased 47 yrs, chokedMother: deceased 61 yrs, hypertension, CHF, COPDBrother 1: aliveSister 1: deceased, domestic violence2 son(s) , 1 daughter(s) .        Social History:  General:  History of smoking  cigarettes: Never smoked no Smoking.  Alcohol: 4 beer/wine a day.  Exercise: walk and hike and walks golf course.  Occupation: retired, Scientist, water quality.  Marital Status: Married.  Grandkids in [place]. Now have townhome in [place].        Medications: Taking Prilosec OTC 20 MG Tablet Delayed Release 1 tablet Once a day, Taking Multivitamin . Tablet 1 tablet Once daily, Taking Fish Oil 1000 MG Capsule 1 capsule with a meal Once a day, Taking Co Q-10 200 MG Capsule 1 capsule with a meal Once a day, Taking Viagra 50 MG Tablet 1 tablet as needed Once a day, Taking Amlodipine Besylate 10 MG Tablet 1 tablet Once a day, Taking Benazepril HCl 20 MG Tablet 1 tablet once a day, Taking Hydrochlorothiazide 12.5 MG Capsule 1 capsule Once a day, Taking Simvastatin 20 MG Tablet 1 tablet every evening Once a day, Taking Aspirin 81 MG Tablet Chewable 1 tablet Once a day, Taking Flax Seed Oil 1000 MG Capsule , Medication List reviewed and reconciled with the patient        Allergies: N.K.D.A.           Vitals: Wt 190, Wt change -.2 lb, Ht 71, BMI 26.50, Pulse sitting 76, BP sitting 130/70.        Examination:  General Examination:  GENERAL APPEARANCE alert, oriented, NAD, pleasant.  SKIN: normal, no rash.  HEAD: [place]/AT.  EYES: PERRL, EOMI, Conjunctiva clear, no drainage.  ORAL CAVITY: moist mucous membranes, no significant abnormalities.  NECK: no lymphadenopathy, no bruits, no JVD.  LUNGS: clear to auscultation bilaterally, no wheezes, rhonchi, rales, regular breathing rate and effort.  HEART: regular rate and rhythm, no S3, S4,  3/6 holosystolic murmur at apex, no rub, point of maximul impulse (PMI) normal.  ABDOMEN: soft, non-tender/non-distended, bowel sounds present, no masses palpated, no bruit.  EXTREMITIES: no clubbing, no edema, pulses 2 plus bilaterally.  PERIPHERAL PULSES: normal (2+) bilaterally.  LYMPH NODES: No cervical adenopathy.  PSYCH Normal affect.  Exam performed at original consult visit.           Assessment:  1. Mitral valve regurgitation - 424.0 (Primary)  2. Hyperlipidemia - 272.4  3. Hypertension,  essential - 401.9        1. Mitral valve regurgitation  LAB: PT (Prothrombin Time) (454098)    Prothrombin Time  10.1   9.1-12.0 - SEC    INR  0.9   0.8-1.2 -    LAB: Basic Metabolic    GLUCOSE  118  H  70-99 - mg/dL    BUN  19   1-19 - mg/dL    CREATININE  1.47   8.29-5.62 - mg/dl    eGFR (NON-AFRICAN AMERICAN)  68   >60 - calc    eGFR (AFRICAN AMERICAN)  82   >60 - calc    SODIUM  139   136-145 - mmol/L    POTASSIUM  3.6   3.5-5.5 - mmol/L    CHLORIDE  104   98-107 - mmol/L    C02  26   22-32 - mmol/L    ANION GAP  12.6   6.0-20.0 - mmol/L    CALCIUM  9.5   8.6-10.3 - mg/dL    LAB: CBC with Diff    WBC  6.1   4.0-11.0 - K/ul    RBC  4.21   4.20-5.80 - M/uL    HGB  14.1   13.0-17.0 - g/dL    HCT  13.0   86.5-78.4 - %    MCH  33.5  H  27.0-33.0 - pg    MPV  9.5   7.5-10.7 - fL    MCV  95.4  H  80.0-94.0 - fL    MCHC  35.1   32.0-36.0 - g/dL    RDW  69.6   29.5-28.4 - %    NRBC#  0.00   -    PLT  181   150-400 - K/uL    NEUT %  66.8   43.3-71.9 - %    NRBC%  0.00   - %    LYMPH%  25.4   16.8-43.5 - %    MONO %  6.3   4.6-12.4 - %    EOS %  0.7   0.0-7.8 - %    BASO %  0.8   0.0-1.0 - %    NEUT #  4.1   1.9-7.2 - K/uL    LYMPH#  1.50   1.10-2.70 - K/uL    MONO #  0.4   0.3-0.8 - K/uL    EOS #  0.0   0.0-0.6 - K/uL    BASO #  0.1   0.0-0.1 - K/uL     Notes: Risks and benefits of cardiac catheterization have been reviewed including risk of stroke, heart attack, death, bleeding, renal impariment and arterial damage. There was ample oppurtuny to answer questions. Alternatives were discussed. Patient understands and wishes to proceed.Right and left heart cath. LEFT groin with abd aorta.        Procedures:  Venipuncture:  Venipuncture: Gasanova,Svetlana 12/08/2012 01:57:07 PM > , performed in right arm.  Procedure Codes: 45409 BLOOD COLLECTION ROUTINE VENIPUNCTURE, 80048 ECL BMP, 85025 ECL CBC PLATELET DIFF        Follow Up: post cath       Provider: Donato Schultz, MD    Interval H and P: Plan of cath discussed, risks and benefits. Willing to proceed.

## 2012-12-21 ENCOUNTER — Ambulatory Visit (INDEPENDENT_AMBULATORY_CARE_PROVIDER_SITE_OTHER): Payer: Medicare Other | Admitting: Thoracic Surgery (Cardiothoracic Vascular Surgery)

## 2012-12-21 ENCOUNTER — Other Ambulatory Visit: Payer: Self-pay | Admitting: *Deleted

## 2012-12-21 ENCOUNTER — Encounter: Payer: Self-pay | Admitting: Thoracic Surgery (Cardiothoracic Vascular Surgery)

## 2012-12-21 VITALS — BP 136/80 | HR 75 | Resp 20 | Ht 73.0 in | Wt 185.0 lb

## 2012-12-21 DIAGNOSIS — I059 Rheumatic mitral valve disease, unspecified: Secondary | ICD-10-CM

## 2012-12-21 DIAGNOSIS — R918 Other nonspecific abnormal finding of lung field: Secondary | ICD-10-CM

## 2012-12-21 DIAGNOSIS — I340 Nonrheumatic mitral (valve) insufficiency: Secondary | ICD-10-CM

## 2012-12-21 MED ORDER — AMIODARONE HCL 200 MG PO TABS: 200.0000 mg | ORAL_TABLET | Freq: Two times a day (BID) | ORAL | Status: DC

## 2012-12-21 NOTE — Patient Instructions (Signed)
Begin amiodarone 1 week prior to surgery

## 2012-12-21 NOTE — Progress Notes (Signed)
[address]            [name] [address]          [phone]     CARDIOTHORACIC SURGERY OFFICE NOTE  Referring Provider is Donato Schultz, MD PCP is Lillia Mountain, MD   HPI:  Patient returns for followup of mitral valve prolapse with severe mitral regurgitation. He was originally seen in consultation on 12/04/2012.  Since then he has undergone left and right heart catheterization by Dr. Anne Fu on 12/15/2012. Cardiac catheterization was notable for the absence of significant coronary artery disease. There was severe mitral regurgitation with normal pulmonary artery pressures. The patient has also undergone CT angiogram of the chest to evaluate the size of his ascending thoracic aorta which at appeared dilated at the time of his recent echocardioram. CT angiogram was notable for the absence of aneurysmal dilatation of the aorta.  The patient returns to the office today to further discuss results of his recent tests and possibly schedule elective mitral valve repair in the near future. He reports no new problems since his original consultation and he specifically denies any problems with shortness of breath, chest discomfort, palpitations, dizzy spells, or syncope.   Current Outpatient Prescriptions  Medication Sig Dispense Refill  . amLODipine (NORVASC) 10 MG tablet Take 10 mg by mouth daily.      Marland Kitchen aspirin EC 81 MG tablet Take 81 mg by mouth daily.      . benazepril (LOTENSIN) 20 MG tablet Take 20 mg by mouth daily.      Marland Kitchen co-enzyme Q-10 30 MG capsule Take 30 mg by mouth 3 (three) times daily.      . fish oil-omega-3 fatty acids 1000 MG capsule Take 2 g by mouth daily.      . hydrochlorothiazide (MICROZIDE) 12.5 MG capsule Take 12.5 mg by mouth daily.      . Multiple Vitamin (MULTIVITAMIN WITH MINERALS) TABS Take 1 tablet by mouth daily.      Marland Kitchen omeprazole (PRILOSEC OTC) 20 MG tablet Take 20 mg by mouth daily.      . sildenafil (VIAGRA) 50 MG tablet  Take 50 mg by mouth as needed for erectile dysfunction.      . simvastatin (ZOCOR) 20 MG tablet Take 20 mg by mouth every evening.       No current facility-administered medications for this visit.      Physical Exam:   BP 136/80  Pulse 75  Resp 20  Ht 6\' 1"  (1.854 m)  Wt 185 lb (83.915 kg)  BMI 24.41 kg/m2  SpO2 98%  General:  Well-appearing  Chest:   Clear to auscultation  CV:   Regular rate and rhythm with holosystolic murmur  Incisions:  n/a  Abdomen:  Soft and nontender  Extremities:  Warm and well-perfused  Diagnostic Tests:  CARDIAC CATHETERIZATION  PROCEDURE: Right and left heart catheterization with selective coronary angiography, left ventriculogram, cardiac outputs, selective oxygen saturations. Abdominal aortogram.  INDICATIONS: 67 year old with severe mitral regurgitation awaiting surgery.  The risks, benefits, and details of the procedure were explained to the patient, including stroke, heart attack, death, renal impairment, arterial damage, bleeding. The patient verbalized understanding and wanted to proceed. Informed written consent was obtained.  PROCEDURE TECHNIQUE: After Xylocaine anesthesia and visualization of the femoral head via fluoroscopy, a 65F sheath was placed in the right femoral artery using the modified Seldinger technique.  A 7 French sheath was inserted into the right femoral vein using the modified Seldinger technique. Left coronary angiography was done using a Judkins L4 catheter. Right coronary angiography was done using a Judkins R4 catheter. Multiple views with hand injection of Omnipaque were obtained. Left ventriculography was done using a pigtail catheter in the RAO position with power injection. Abdominal aortogram been performed. Power injection. Right heart catheterization was performed with a balloon tipped Swan-Ganz catheter traversing the right-sided heart structures to the wedge position. Oxygen saturation was drawn in the pulmonary artery as  well as femoral artery. Hemodynamics were obtained. Cardiac outputs were obtained utilizing the Fick method. Following the procedure, sheaths were removed and patient was hemodynamically stable.  CONTRAST: Total of 90 ml.  FLOUROSCOPY TIME: 2.8 minutes.  COMPLICATIONS: None.  HEMODYNAMICS:  Right atrium (RA): 9/6/5 mmHg  Right ventricle (RV): 29/6/10 mmHg  Pulmonary artery (PA): 24/9/16 mmHg  Pulmonary capillary wedge pressure (PCWP): 15/12/10 mmHg  Cardiac output: Fick 5.8 liters/min Cardiac index: Fick 2.8.  PA saturation: 70 %  FA saturation: 93 %  Aortic pressure: 108/57/81 mmHg  LV pressure: 108 mmHg systolic; LVEDP 16 mmHg.  There was no gradient between the left ventricle and aorta.  ANGIOGRAPHIC DATA:  Left main: No angiographically significant disease. Branches into LAD, high diagonal, ramus, circumflex.  Left anterior descending (LAD): No angiographically significant disease. High diagonal branch. Wraps around the apex.  Circumflex artery (CIRC): No angiographically significant disease, high ramus branch, one obtuse marginal branch.  Right coronary artery (RCA): Dominant vessel giving rise to the posterior descending artery. No angiographically significant disease.  LEFT VENTRICULOGRAM: Left ventricular angiogram was done in the 30 RAO projection and revealed normal left ventricular wall motion and systolic function with an estimated ejection fraction of 70%. 4+ mitral regurgitation, severe. Abdominal aortogram-no renal artery stenosis, no abdominal aortic aneurysm, normal iliacs, normal femoral arteries.  IMPRESSIONS:  1. No angiographically significant coronary artery disease. 2. Normal left ventricular systolic function. LVEDP 16 mmHg. Ejection fraction 70%. 3. Normal right-sided heart pressures. 4. Normal cardiac output. 5. Severe mitral regurgitation.  6. No abdominal aortic aneurysm, no renal artery stenosis, and normal iliac arteries, normal femoral arteries.    RECOMMENDATION: I will relay to Dr. Cornelius Moras. Await surgery.         CT ANGIOGRAPHY CHEST  Technique: Multidetector CT imaging of the chest using the  standard protocol during bolus administration of intravenous  contrast. Multiplanar reconstructed images including MIPs were  obtained and reviewed to evaluate the vascular anatomy.  Contrast: OMNIPAQUE IOHEXOL 300 MG/ML SOLN  Comparison: None.  Vascular Findings:  Normal caliber of the thoracic aorta. While evaluation of the  ascending thoracic aorta is degraded secondary to pulsation  artifact, there is no discrete evidence of thoracic aortic  dissection. No periaortic stranding.  Conventional configuration of the aortic arch. The visualized  portions of the major branch vessels of the thoracic aorta are  widely patent.  Cardiomegaly. No pericardial effusion.  Although this examination was not tailored for evaluation of the  pulmonary arteries, there are no discrete filling defects within  the central pulmonary arterial tree to suggest pulmonary embolism.  Normal caliber of the main pulmonary artery.  ------------------------------------------------------------  Nonvascular findings:  Grossly symmetric dependent subpleural ground-glass atelectasis.  No focal airspace opacity. No pleural effusion or pneumothorax.  Multiple pulmonary nodules are seen bilaterally. Dominant nodule  within the superior segment of the left lower lobe measures 6 mm in  diameter (image 31, series  six). There is an approximately 4 mm  nodule adjacent to the right major fissure (image 27, series six).  There is an approximately 4 mm pulmonary nodule is seen within the  medial aspect of the right upper lobe (image 32, series 6) with a  smaller approximately 3 mm pulmonary nodule also within the right  upper lobe (image 31, series six).  Shoddy mediastinal lymph nodes are not enlarged by CT criteria with  index pretracheal lymph node measuring 8 mm in  short axis diameter  (image 39, series 5 and index right infrahilar node measuring  approximately 1 cm in short axis diameter (image 51, series five).  No definite mediastinal, hilar or axillary lymphadenopathy.  Early arterial phase evaluation of superior aspect of the upper  abdomen demonstrate shoddy porta hepatis lymph nodes with index  precaval node measuring 9 mm in short axis diameter (image 115,  series 5, not enlarged by CT criteria. There is grossly symmetric  likely age related perinephric stranding. No definite evidence of  urinary obstruction.  Incidental note is made of a sub centimeter (approximately 0.9 cm)  hypoattenuating lesion with the right lobe of the thyroid (image  17, series five).  No acute or aggressive osseous abnormalities.  IMPRESSION:  1. No acute cardiopulmonary disease. Specifically, no evidence of  thoracic aortic aneurysm or dissection.  2. Cardiomegaly.  3. Indeterminate approximately 0.9 cm hypoattenuating lesion with  the right lobe of the thyroid. Further evaluation with dedicated  thyroid ultrasound may be performed as clinically indicated.  4. Indeterminate pulmonary nodules, the largest of which within  the left lower lobe measures 6 mm in diameter. If the patient is at  high risk for bronchogenic carcinoma, follow-up chest CT at 6-12  months is recommended. If the patient is at low risk for  bronchogenic carcinoma, follow-up chest CT at 12 months is  recommended. This recommendation follows the consensus statement:  Guidelines for Management of Small Pulmonary Nodules Detected on CT  Scans: A Statement from the Fleischner Society as published in  Radiology 2005; 237:395-400.  Original Report Authenticated By: Tacey Ruiz, MD   Impression:  The patient has mitral valve prolapse with a large flail segment of the posterior leaflet and severe mitral regurgitation. Left ventricular size and systolic function remains normal. The patient remains  asymptomatic. Options include continued medical therapy with close followup versus proceeding with elective mitral valve repair. Based upon extensive review of the patient's transesophageal echocardiogram I feel there is very high likelihood his valve should be repairable with a durable result. Patient appears to be a relatively good candidate for minimally invasive approach for surgery.  CT scan of the chest demonstrates a normal appearing thoracic aorta would also demonstrates a few small indeterminate pulmonary nodules which probably warrant repeat CT scan in 12 months.   Plan:  I spent in excess of 45 minutes reviewing the indications and potential benefits of elective mitral valve repair with the patient and his wife in the office today.  The rationale for elective mitral valve repair surgery has been explained, including a comparison between surgery and continued medical therapy with close follow-up.  The likelihood of successful and durable valve repair has been discussed with particular reference to the findings of their recent echocardiogram.  Based upon these findings and previous experience, I have quoted them a greater than 95 percent likelihood of successful valve repair.  Alternative surgical approaches have been discussed, including a comparison between conventional sternotomy and minimally-invasive techniques.  The relative risks and benefits of each have been reviewed as they pertain to the patient's specific circumstances, and all of their questions have been addressed.  We plan to proceed with surgery on Wednesday, April 9. The patient will return for followup on Monday, April 7 prior to surgery. He has been given a prescription for amiodarone to begin one week prior to surgery to decrease his risk of perioperative atrial arrhythmias.     Salvatore Decent. Cornelius Moras, MD 12/21/2012 7:17 PM

## 2012-12-30 ENCOUNTER — Encounter (HOSPITAL_COMMUNITY): Payer: Self-pay | Admitting: Pharmacy Technician

## 2013-01-04 ENCOUNTER — Encounter (HOSPITAL_COMMUNITY): Payer: Self-pay

## 2013-01-04 ENCOUNTER — Encounter: Payer: Self-pay | Admitting: Thoracic Surgery (Cardiothoracic Vascular Surgery)

## 2013-01-04 ENCOUNTER — Ambulatory Visit (HOSPITAL_COMMUNITY)
Admission: RE | Admit: 2013-01-04 | Discharge: 2013-01-04 | Disposition: A | Discharge status: Home / Self Care | Payer: Medicare Other | Source: Ambulatory Visit | Attending: Thoracic Surgery (Cardiothoracic Vascular Surgery) | Admitting: Thoracic Surgery (Cardiothoracic Vascular Surgery)

## 2013-01-04 ENCOUNTER — Encounter (HOSPITAL_COMMUNITY)
Admission: RE | Admit: 2013-01-04 | Discharge: 2013-01-04 | Disposition: A | Discharge status: Home / Self Care | Payer: Medicare Other | Source: Ambulatory Visit | Attending: Thoracic Surgery (Cardiothoracic Vascular Surgery) | Admitting: Thoracic Surgery (Cardiothoracic Vascular Surgery)

## 2013-01-04 ENCOUNTER — Institutional Professional Consult (permissible substitution) (INDEPENDENT_AMBULATORY_CARE_PROVIDER_SITE_OTHER): Payer: Medicare Other | Admitting: Thoracic Surgery (Cardiothoracic Vascular Surgery)

## 2013-01-04 VITALS — BP 128/75 | HR 76 | Temp 97.9°F | Resp 20 | Ht 71.0 in | Wt 189.8 lb

## 2013-01-04 VITALS — BP 140/79 | HR 79 | Resp 16 | Ht 71.0 in | Wt 185.0 lb

## 2013-01-04 DIAGNOSIS — Z01812 Encounter for preprocedural laboratory examination: Secondary | ICD-10-CM | POA: Insufficient documentation

## 2013-01-04 DIAGNOSIS — I517 Cardiomegaly: Secondary | ICD-10-CM | POA: Insufficient documentation

## 2013-01-04 DIAGNOSIS — M8448XA Pathological fracture, other site, initial encounter for fracture: Secondary | ICD-10-CM | POA: Insufficient documentation

## 2013-01-04 DIAGNOSIS — Z01818 Encounter for other preprocedural examination: Secondary | ICD-10-CM | POA: Insufficient documentation

## 2013-01-04 DIAGNOSIS — I059 Rheumatic mitral valve disease, unspecified: Secondary | ICD-10-CM

## 2013-01-04 DIAGNOSIS — I340 Nonrheumatic mitral (valve) insufficiency: Secondary | ICD-10-CM | POA: Insufficient documentation

## 2013-01-04 DIAGNOSIS — I10 Essential (primary) hypertension: Secondary | ICD-10-CM | POA: Insufficient documentation

## 2013-01-04 DIAGNOSIS — Z01810 Encounter for preprocedural cardiovascular examination: Secondary | ICD-10-CM

## 2013-01-04 DIAGNOSIS — R918 Other nonspecific abnormal finding of lung field: Secondary | ICD-10-CM

## 2013-01-04 LAB — URINALYSIS, ROUTINE W REFLEX MICROSCOPIC
Glucose, UA: NEGATIVE mg/dL
Leukocytes, UA: NEGATIVE
pH: 6 (ref 5.0–8.0)

## 2013-01-04 LAB — PROTIME-INR: INR: 0.93 (ref 0.00–1.49)

## 2013-01-04 LAB — ABO/RH: ABO/RH(D): A NEG

## 2013-01-04 LAB — HEMOGLOBIN A1C: Hgb A1c MFr Bld: 5 % (ref <5.7)

## 2013-01-04 LAB — COMPREHENSIVE METABOLIC PANEL
Alkaline Phosphatase: 74 U/L (ref 39–117)
BUN: 19 mg/dL (ref 6–23)
GFR calc Af Amer: 80 mL/min — ABNORMAL LOW (ref >90)
Glucose, Bld: 97 mg/dL (ref 70–99)
Potassium: 4 mEq/L (ref 3.5–5.1)
Total Bilirubin: 0.4 mg/dL (ref 0.3–1.2)
Total Protein: 7.4 g/dL (ref 6.0–8.3)

## 2013-01-04 LAB — BLOOD GAS, ARTERIAL
Acid-base deficit: 2.2 mmol/L — ABNORMAL HIGH (ref 0.0–2.0)
Bicarbonate: 20.7 mEq/L (ref 20.0–24.0)
O2 Saturation: 96.7 %
Patient temperature: 98.6
TCO2: 21.5 mmol/L (ref 0–100)

## 2013-01-04 LAB — CBC
HCT: 36.6 % — ABNORMAL LOW (ref 39.0–52.0)
Hemoglobin: 13.4 g/dL (ref 13.0–17.0)
MCHC: 36.6 g/dL — ABNORMAL HIGH (ref 30.0–36.0)
MCV: 92.9 fL (ref 78.0–100.0)

## 2013-01-04 LAB — PULMONARY FUNCTION TEST

## 2013-01-04 LAB — SURGICAL PCR SCREEN
MRSA, PCR: NEGATIVE
Staphylococcus aureus: NEGATIVE

## 2013-01-04 MED ORDER — CHLORHEXIDINE GLUCONATE 4 % EX LIQD: 30.0000 mL | CUTANEOUS | Status: DC

## 2013-01-04 NOTE — Pre-Procedure Instructions (Signed)
James [name]  01/04/2013   Your procedure is scheduled on:  01-06-2013  Wednesday   Report to [facility] Stay [facility] at 6:30 AM   Take [facility] to 3rd floor     Call this number if you have problems the morning of surgery: [phone]   Remember:   Do not eat food or drink liquids after midnight.   Take these medicines the morning of surgery with A SIP OF WATER: amiodarone(Pacerone),AMLODIPINE(NORVASC),omeprazole(Prilosec),   Do not wear jewelry,   Do not wear lotions, powders, or perfumes. You may wear deodorant.  Do not shave 48 hours prior to surgery. Men may shave face and neck.  Do not bring valuables to the hospital.  Contacts, dentures or bridgework may not be worn into surgery.  Leave suitcase in the car. After surgery it may be brought to your room.   For patients admitted to the hospital, checkout time is 11:00 AM the day of discharge.   Patients discharged the day of surgery will not be allowed to drive home.    Special Instructions: Incentive Spirometry - Practice and bring it with you on the day of surgery. Shower using CHG 2 nights before surgery and the night before surgery.  If you shower the day of surgery use CHG.  Use special wash - you have one bottle of CHG for all showers.  You should use approximately 1/3 of the bottle for each shower.   Please read over the following fact sheets that you were given: Pain Booklet, Coughing and Deep Breathing, Blood Transfusion Information and Surgical Site Infection Prevention

## 2013-01-04 NOTE — Progress Notes (Signed)
                   [address]            [name] [address]          [phone]     CARDIOTHORACIC SURGERY OFFICE NOTE  Referring Provider is Donato Schultz, MD PCP is Lillia Mountain, MD   HPI:  Patient returns for followup of severe mitral regurgitation with tentatively plans to proceed with minimally invasive mitral valve repair later this week. He was originally seen in consultation on 12/04/2012 and most recently he was seen on 12/21/2012. Since then the patient has remained clinically stable.James [name] He reports no new problems or complaints. He is eager to proceed with surgery as previously planned.   Current Outpatient Prescriptions  Medication Sig Dispense Refill  . amiodarone (PACERONE) 200 MG tablet Take 1 tablet (200 mg total) by mouth 2 (two) times daily. Begin 7 days prior to surgery.  14 tablet  0  . amLODipine (NORVASC) 10 MG tablet Take 10 mg by mouth daily.      James [name] aspirin EC 81 MG tablet Take 81 mg by mouth daily.      . benazepril (LOTENSIN) 20 MG tablet Take 20 mg by mouth daily.      . cetirizine (ZYRTEC) 10 MG tablet Take 10 mg by mouth daily as needed for allergies.       . Coenzyme Q10 (CO Q-10) 300 MG CAPS Take 1 capsule by mouth daily.      . fish oil-omega-3 fatty acids 1000 MG capsule Take 1 g by mouth daily.       . hydrochlorothiazide (MICROZIDE) 12.5 MG capsule Take 12.5 mg by mouth daily.      . Multiple Vitamin (MULTIVITAMIN WITH MINERALS) TABS Take 1 tablet by mouth daily.      James [name] omeprazole (PRILOSEC OTC) 20 MG tablet Take 20 mg by mouth daily.      . sildenafil (VIAGRA) 50 MG tablet Take 50 mg by mouth as needed for erectile dysfunction.      . simvastatin (ZOCOR) 20 MG tablet Take 20 mg by mouth every evening.       No current facility-administered medications for this visit.      Physical Exam:   BP 140/79  Pulse 79  Resp 16  Ht 5\' 11"  (1.803 m)  Wt 185 lb (83.915 kg)  BMI 25.81 kg/m2  SpO2  97%  General:  Well-appearing  Chest:   Clear to auscultation  CV:   Regular rate and rhythm with systolic murmur  Incisions:  n/a  Abdomen:  Soft and nontender  Extremities:  Warm and well-perfused  Diagnostic Tests:  n/a   Impression:  Mitral valve prolapse with severe mitral regurgitation  Plan:  We plan to proceed with minimally invasive mitral valve repair on Wednesday, 01/06/2013. I spent in excess of 20 minutes again reviewing the indications, risks, and potential benefits of surgery with the patient and his wife. Expectations for his postoperative recovery were discussed. All of their questions have been addressed.   Salvatore Decent. Cornelius Moras, MD 01/04/2013 11:44 AM

## 2013-01-04 NOTE — Patient Instructions (Signed)
Continue all current medications through the day prior to surgery.  On the morning of surgery do not take any medications.

## 2013-01-04 NOTE — Progress Notes (Signed)
Pre-op Cardiac Surgery  Carotid Findings:  No ICA stenosis.  Vertebral artery flow is antegrade.  Upper Extremity Right Left  Brachial Pressures 133T 130T  Radial Waveforms T T  Ulnar Waveforms T T  Palmar Arch (Allen's Test) Doppler signal obliterates with radial compression and remains normal with ulnar compression. WNL   Findings:      Lower  Extremity Right Left  Dorsalis Pedis    Anterior Tibial    Posterior Tibial    Ankle/Brachial Indices      Findings:

## 2013-01-04 NOTE — Progress Notes (Signed)
Pt stated Dr Cornelius Moras instructed not to take any of medication the day of surgery, told pt to follow surgeon instructions for medications DOS.

## 2013-01-04 NOTE — H&P (Signed)
CARDIOTHORACIC SURGERY HISTORY AND PHYSICAL EXAM  Referring Provider is Donato Schultz, MD PCP is Lillia Mountain, MD    Chief Complaint   Patient presents with   .  Mitral Regurgitation       Referral from Dr Anne Fu for surgical eval on severe MR, 2D- ECHO 11/19/12 , TEE 12/03/12     HPI:  Patient is a 67 year old retired white male who has been fairly healthy most of his life but was first told he had a heart murmur more than 35 years ago. He was diagnosed with mitral valve prolapse and mitral regurgitation several years ago, and up until recently he had been followed in [place] with regular echocardiograms. Approximately one year ago he was seen by his cardiologist in [place] and told that the severity of mitral regurgitation have gotten worse and that the size of the heart had also increased. The patient has recently retired and moved [place] and now established care locally with Dr. Valentina Lucks and Dr. Anne Fu. Followup transthoracic echocardiogram was performed demonstrating mitral valve prolapse with severe mitral regurgitation. The patient subsequently underwent transesophageal echocardiogram confirmed the presence of severe mitral regurgitation with flail scallop of the posterior leaflet. Left ventricular size and systolic function remains normal. The patient was referred for elective surgical consultation. He was originally seen in consultation on 12/04/2012.  Since then he has undergone left and right heart catheterization by Dr. Anne Fu on 12/15/2012. Cardiac catheterization was notable for the absence of significant coronary artery disease. There was severe mitral regurgitation with normal pulmonary artery pressures. The patient has also undergone CT angiogram of the chest to evaluate the size of his ascending thoracic aorta which at appeared dilated at the time of his recent echocardioram. CT angiogram was notable for the absence of aneurysmal dilatation of the aorta.    The  patient remains relatively active physically. He specifically denies any exertional shortness of breath. He enjoys golf and regularly walks 18 holes of golf caring his own bag. He also enjoys hiking. He has not appreciated any change in his exercise tolerance. He specifically denies any resting shortness of breath, PND, orthopnea, or lower extremity edema. He has not had any chest pains or chest tightness either with activity or at rest. He does have occasional palpitations. He has no documented history of irregular heart rhythm or atrial fibrillation.   Past Medical History  Diagnosis Date  . Hypertension   . GERD (gastroesophageal reflux disease)   . Barrett esophagus 2002  . Severe mitral regurgitation   . Hyperlipidemia   . Erectile dysfunction     Past Surgical History  Procedure Laterality Date  . Tee without cardioversion N/A 12/03/2012    Procedure: TRANSESOPHAGEAL ECHOCARDIOGRAM (TEE);  Surgeon: Donato Schultz, MD;  Location: [facility] ENDOSCOPY;  Service: Cardiovascular;  Laterality: N/A;  h/p in file cabinet-ja    Family History  Problem Relation Age of Onset  . Hypertension Mother   . COPD Mother   . Heart failure Mother     Social History History  Substance Use Topics  . Smoking status: Never Smoker   . Smokeless tobacco: Never Used  . Alcohol Use: 12.6 oz/week    7 Glasses of wine, 14 Cans of beer per week    Prior to Admission medications   Medication Sig Start Date End Date Taking? Authorizing Provider  amiodarone (PACERONE) 200 MG tablet Take 1 tablet (200 mg total) by mouth 2 (two) times daily. Begin 7 days  prior to surgery. 12/21/12  Yes Purcell Nails, MD  amLODipine (NORVASC) 10 MG tablet Take 10 mg by mouth daily.   Yes Historical Provider, MD  aspirin EC 81 MG tablet Take 81 mg by mouth daily.   Yes Historical Provider, MD  benazepril (LOTENSIN) 20 MG tablet Take 20 mg by mouth daily.   Yes Historical Provider, MD  cetirizine (ZYRTEC) 10 MG tablet Take 10 mg by  mouth daily as needed for allergies.    Yes Historical Provider, MD  Coenzyme Q10 (CO Q-10) 300 MG CAPS Take 1 capsule by mouth daily.   Yes Historical Provider, MD  fish oil-omega-3 fatty acids 1000 MG capsule Take 1 g by mouth daily.    Yes Historical Provider, MD  hydrochlorothiazide (MICROZIDE) 12.5 MG capsule Take 12.5 mg by mouth daily.   Yes Historical Provider, MD  Multiple Vitamin (MULTIVITAMIN WITH MINERALS) TABS Take 1 tablet by mouth daily.   Yes Historical Provider, MD  omeprazole (PRILOSEC OTC) 20 MG tablet Take 20 mg by mouth daily.   Yes Historical Provider, MD  sildenafil (VIAGRA) 50 MG tablet Take 50 mg by mouth as needed for erectile dysfunction.   Yes Historical Provider, MD  simvastatin (ZOCOR) 20 MG tablet Take 20 mg by mouth every evening.   Yes Historical Provider, MD    No Known Allergies   Review of Systems:              General:                      normal appetite, normal energy, no weight gain, no weight loss, no fever             Cardiac:                      no chest pain with exertion, no chest pain at rest, no SOB with exertion, no resting SOB, no PND, no orthopnea, + palpitations, no arrhythmia, no atrial fibrillation, no LE edema, no dizzy spells, no syncope             Respiratory:                no shortness of breath, no home oxygen, no productive cough, no dry cough, no bronchitis, no wheezing, no hemoptysis, no asthma, no pain with inspiration or cough, no sleep apnea, no CPAP at night             GI:                                no difficulty swallowing, + reflux, no frequent heartburn, no hiatal hernia, no abdominal pain, no constipation, no diarrhea, no hematochezia, no hematemesis, no melena             GU:                              no dysuria,  no frequency, no urinary tract infection, no hematuria, no enlarged prostate, no kidney stones, no kidney disease             Vascular:                     no pain suggestive of claudication, no pain in  feet, no leg cramps, no varicose veins, no DVT, no non-healing foot  ulcer             Neuro:                         no stroke, no TIA's, no seizures, no headaches, no temporary blindness one eye,  no slurred speech, no peripheral neuropathy, no chronic pain, no instability of gait, no memory/cognitive dysfunction             Musculoskeletal:         no arthritis, no joint swelling, no myalgias, no difficulty walking, normal mobility               Skin:                            no rash, no itching, no skin infections, no pressure sores or ulcerations             Psych:                         no anxiety, no depression, no nervousness, no unusual recent stress             Eyes:                           no blurry vision, no floaters, no recent vision changes,  wears glasses or contacts             ENT:                            no hearing loss, no loose or painful teeth, no dentures, last saw dentist within the past year             Hematologic:               no easy bruising, no abnormal bleeding, no clotting disorder, no frequent epistaxis             Endocrine:                   no diabetes, does not check CBG's at home                           Physical Exam:              BP 145/82  Pulse 78  Resp 20  Ht 5\' 11"  (1.803 m)  Wt 185 lb (83.915 kg)  BMI 25.81 kg/m2  SpO2 98%             General:                      Healthy, well-appearing             HEENT:                       Unremarkable               Neck:                           no JVD, no bruits, no adenopathy               Chest:  clear to auscultation, symmetrical breath sounds, no wheezes, no rhonchi               CV:                              RRR, prominent grade IV/VI holosystolic murmur best at apex w/ radiation across the precordium and to the back             Abdomen:                    soft, non-tender, no masses               Extremities:                 warm, well-perfused, pulses palpable  throughout, no LE edema             Rectal/GU                   Deferred             Neuro:                         Grossly non-focal and symmetrical throughout             Skin:                            Clean and dry, no rashes, no breakdown   Diagnostic Tests:  TRANSTHORACIC ECHOCARDIOGRAM  Report of transthoracic echocardiogram performed at [facility] cardiology on 11/19/2012 is reviewed. There is mitral valve prolapse severe prolapse involving the posterior leaflet. There is moderate to severe mitral regurgitation directed anteriorly. There is mild tricuspid regurgitation. Left ventricular systolic function is normal with ejection fraction estimated 60-65%. There is moderate concentric left ventricular hypertrophy. There is mildly elevated right ventricular systolic pressure. There is mild aortic root dilatation with the sinus of Valsalva measuring 3.9 cm. There is mild calcification of aortic valve.    Transesophageal Echocardiography  Patient: Ebrahim, Deremer MR #: [id number] Study Date: 12/03/2012 Gender: M Age: 89 Height: 154.9cm Weight: 84.1kg BSA: 1.76m^2 Pt. Status: Room: [facility], Ec ORDERING Skains, Mark SONOGRAPHER [place], [place] cc:  ------------------------------------------------------------ LV EF: 65% - 70%  ------------------------------------------------------------ Study Conclusions  - Left ventricle: The cavity size was normal. Wall thickness was normal. Systolic function was vigorous. The estimated ejection fraction was in the range of 65% to 70%. - Aortic valve: No evidence of vegetation. - Mitral valve: Severe prolapse, involving the medial scallop of the posterior leaflet with evidence of ruptured chordae. Severe regurgitation directed eccentrically and anteriorly. - Left atrium: The atrium was moderately (5.3cm) dilated. No evidence of thrombus in the atrial cavity or appendage. No evidence of thrombus in the appendage.  No evidence of thrombus in the appendage. - Right atrium: No evidence of thrombus in the atrial cavity or appendage. - Atrial septum: No defect or patent foramen ovale was identified. - Tricuspid valve: No evidence of vegetation. - Pulmonic valve: No evidence of vegetation. - Superior vena cava: The study excluded a thrombus. Transesophageal echocardiography. 2D and color Doppler. Height: Height: 154.9cm. Height: 61in. Weight: Weight: 84.1kg. Weight: 185lb. Body mass index: BMI: 35kg/m^2. Body surface area: BSA: 1.6m^2. Blood pressure: 145/73. Patient status: Inpatient. Location: Endoscopy.  ------------------------------------------------------------  ------------------------------------------------------------ Left ventricle: The cavity size was normal. Wall  thickness was normal. Systolic function was vigorous. The estimated ejection fraction was in the range of 65% to 70%.  ------------------------------------------------------------ Aortic valve: Structurally normal valve. Cusp separation was normal. No evidence of vegetation. Doppler: No regurgitation.  ------------------------------------------------------------ Aorta: The aorta was normal, not dilated, and non-diseased.  ------------------------------------------------------------ Mitral valve: Severe prolapse, involving the medial scallop of the posterior leaflet with evidence of ruptured chordae. Doppler: Severe regurgitation directed eccentrically and anteriorly. Regurgitant jet fills left atrial appendage.  ------------------------------------------------------------ Left atrium: The atrium was moderately (5.3cm) dilated. No evidence of thrombus in the atrial cavity or appendage. No evidence of thrombus in the appendage. No evidence of thrombus in the appendage. The appendage was of normal size.  ------------------------------------------------------------ Atrial septum: No defect or patent foramen ovale  was identified.  ------------------------------------------------------------ Right ventricle: The cavity size was normal. Wall thickness was normal. Systolic function was normal.  ------------------------------------------------------------ Pulmonic valve: Structurally normal valve. Cusp separation was normal. No evidence of vegetation.  ------------------------------------------------------------ Tricuspid valve: Structurally normal valve. Leaflet separation was normal. No evidence of vegetation. Doppler: Trivial regurgitation.  ------------------------------------------------------------ Right atrium: The atrium was normal in size. No evidence of thrombus in the atrial cavity or appendage.  ------------------------------------------------------------ Pericardium: The pericardium was normal in appearance. There was no pericardial effusion.  ------------------------------------------------------------ Systemic veins: Superior vena cava: The study excluded a thrombus.  ------------------------------------------------------------ Post procedure conclusions Ascending Aorta:  - The aorta was normal, not dilated, and non-diseased.  ------------------------------------------------------------ Prepared and Electronically Authenticated by  Donato Schultz 2014-03-06T16:19:[id number]    CARDIAC CATHETERIZATION    PROCEDURE: Right and left heart catheterization with selective coronary angiography, left ventriculogram, cardiac outputs, selective oxygen saturations. Abdominal aortogram.   INDICATIONS: 67 year old with severe mitral regurgitation awaiting surgery.   The risks, benefits, and details of the procedure were explained to the patient, including stroke, heart attack, death, renal impairment, arterial damage, bleeding. The patient verbalized understanding and wanted to proceed. Informed written consent was obtained.   PROCEDURE TECHNIQUE: After Xylocaine anesthesia and  visualization of the femoral head via fluoroscopy, a 9F sheath was placed in the right femoral artery using the modified Seldinger technique. A 7 French sheath was inserted into the right femoral vein using the modified Seldinger technique. Left coronary angiography was done using a Judkins L4 catheter. Right coronary angiography was done using a Judkins R4 catheter. Multiple views with hand injection of Omnipaque were obtained. Left ventriculography was done using a pigtail catheter in the RAO position with power injection. Abdominal aortogram been performed. Power injection. Right heart catheterization was performed with a balloon tipped Swan-Ganz catheter traversing the right-sided heart structures to the wedge position. Oxygen saturation was drawn in the pulmonary artery as well as femoral artery. Hemodynamics were obtained. Cardiac outputs were obtained utilizing the Fick method. Following the procedure, sheaths were removed and patient was hemodynamically stable.   CONTRAST: Total of 90 ml.   FLOUROSCOPY TIME: 2.8 minutes.   COMPLICATIONS: None.   HEMODYNAMICS:   Right atrium (RA): 9/6/5 mmHg   Right ventricle (RV): 29/6/10 mmHg   Pulmonary artery (PA): 24/9/16 mmHg   Pulmonary capillary wedge pressure (PCWP): 15/12/10 mmHg   Cardiac output: Fick 5.8 liters/min Cardiac index: Fick 2.8.   PA saturation: 70 %   FA saturation: 93 %   Aortic pressure: 108/57/81 mmHg   LV pressure: 108 mmHg systolic; LVEDP 16 mmHg.   There was no gradient between the left ventricle and aorta.   ANGIOGRAPHIC DATA:   Left main: No angiographically significant disease. Branches into LAD,  high diagonal, ramus, circumflex.   Left anterior descending (LAD): No angiographically significant disease. High diagonal branch. Wraps around the apex.   Circumflex artery (CIRC): No angiographically significant disease, high ramus branch, one obtuse marginal branch.   Right coronary artery (RCA): Dominant vessel giving rise to  the posterior descending artery. No angiographically significant disease.   LEFT VENTRICULOGRAM: Left ventricular angiogram was done in the 30 RAO projection and revealed normal left ventricular wall motion and systolic function with an estimated ejection fraction of 70%. 4+ mitral regurgitation, severe. Abdominal aortogram-no renal artery stenosis, no abdominal aortic aneurysm, normal iliacs, normal femoral arteries.   IMPRESSIONS:   1. No angiographically significant coronary artery disease. 2. Normal left ventricular systolic function. LVEDP 16 mmHg. Ejection fraction 70%. 3. Normal right-sided heart pressures. 4. Normal cardiac output. 5. Severe mitral regurgitation.   6. No abdominal aortic aneurysm, no renal artery stenosis, and normal iliac arteries, normal femoral arteries.   RECOMMENDATION: I will relay to Dr. Cornelius Moras. Await surgery.            CT ANGIOGRAPHY CHEST  Technique: Multidetector CT imaging of the chest using the   standard protocol during bolus administration of intravenous   contrast. Multiplanar reconstructed images including MIPs were   obtained and reviewed to evaluate the vascular anatomy.   Contrast: OMNIPAQUE IOHEXOL 300 MG/ML SOLN   Comparison: None.   Vascular Findings:   Normal caliber of the thoracic aorta. While evaluation of the   ascending thoracic aorta is degraded secondary to pulsation   artifact, there is no discrete evidence of thoracic aortic   dissection. No periaortic stranding.   Conventional configuration of the aortic arch. The visualized   portions of the major branch vessels of the thoracic aorta are   widely patent.   Cardiomegaly. No pericardial effusion.   Although this examination was not tailored for evaluation of the   pulmonary arteries, there are no discrete filling defects within   the central pulmonary arterial tree to suggest pulmonary embolism.   Normal caliber of the main pulmonary artery.    ------------------------------------------------------------   Nonvascular findings:   Grossly symmetric dependent subpleural ground-glass atelectasis.   No focal airspace opacity. No pleural effusion or pneumothorax.   Multiple pulmonary nodules are seen bilaterally. Dominant nodule   within the superior segment of the left lower lobe measures 6 mm in   diameter (image 31, series six). There is an approximately 4 mm   nodule adjacent to the right major fissure (image 27, series six).   There is an approximately 4 mm pulmonary nodule is seen within the   medial aspect of the right upper lobe (image 32, series 6) with a   smaller approximately 3 mm pulmonary nodule also within the right   upper lobe (image 31, series six).   Shoddy mediastinal lymph nodes are not enlarged by CT criteria with   index pretracheal lymph node measuring 8 mm in short axis diameter   (image 39, series 5 and index right infrahilar node measuring   approximately 1 cm in short axis diameter (image 51, series five).   No definite mediastinal, hilar or axillary lymphadenopathy.   Early arterial phase evaluation of superior aspect of the upper   abdomen demonstrate shoddy porta hepatis lymph nodes with index   precaval node measuring 9 mm in short axis diameter (image 115,   series 5, not enlarged by CT criteria. There is grossly symmetric   likely age related perinephric stranding. No  definite evidence of   urinary obstruction.   Incidental note is made of a sub centimeter (approximately 0.9 cm)   hypoattenuating lesion with the right lobe of the thyroid (image   17, series five).   No acute or aggressive osseous abnormalities.   IMPRESSION:   1. No acute cardiopulmonary disease. Specifically, no evidence of   thoracic aortic aneurysm or dissection.   2. Cardiomegaly.   3. Indeterminate approximately 0.9 cm hypoattenuating lesion with   the right lobe of the thyroid. Further evaluation with dedicated   thyroid  ultrasound may be performed as clinically indicated.   4. Indeterminate pulmonary nodules, the largest of which within   the left lower lobe measures 6 mm in diameter. If the patient is at   high risk for bronchogenic carcinoma, follow-up chest CT at 6-12   months is recommended. If the patient is at low risk for   bronchogenic carcinoma, follow-up chest CT at 12 months is   recommended. This recommendation follows the consensus statement:   Guidelines for Management of Small Pulmonary Nodules Detected on CT   Scans: A Statement from the Fleischner Society as published in   Radiology 2005; 237:395-400.   Original Report Authenticated By: Tacey Ruiz, MD   Impression:  The patient has mitral valve prolapse with a large flail segment of the posterior leaflet and severe mitral regurgitation. Left ventricular size and systolic function remains normal. The patient remains asymptomatic. Options include continued medical therapy with close followup versus proceeding with elective mitral valve repair. Based upon extensive review of the patient's transesophageal echocardiogram I feel there is very high likelihood his valve should be repairable with a durable result. Patient appears to be a relatively good candidate for minimally invasive approach for surgery.  CT scan of the chest demonstrates a normal appearing thoracic aorta would also demonstrates a few small indeterminate pulmonary nodules which probably warrant repeat CT scan in 12 months.   Plan:  I spent in excess of 45 minutes reviewing the indications and potential benefits of elective mitral valve repair with the patient and his wife in the office.  The rationale for elective mitral valve repair surgery has been explained, including a comparison between surgery and continued medical therapy with close follow-up.  The likelihood of successful and durable valve repair has been discussed with particular reference to the findings of their recent  echocardiogram.  Based upon these findings and previous experience, I have quoted them a greater than 95 percent likelihood of successful valve repair.  Alternative surgical approaches have been discussed, including a comparison between conventional sternotomy and minimally-invasive techniques.  The relative risks and benefits of each have been reviewed as they pertain to the patient's specific circumstances, and all of their questions have been addressed. We plan to proceed with surgery on Wednesday, April 9. The patient will return for followup on Monday, April 7 prior to surgery. He has been given a prescription for amiodarone to begin one week prior to surgery to decrease his risk of perioperative atrial arrhythmias.   Salvatore Decent. Cornelius Moras, MD

## 2013-01-05 ENCOUNTER — Encounter (HOSPITAL_COMMUNITY): Payer: Self-pay

## 2013-01-05 MED ORDER — DEXTROSE 5 % IV SOLN
1.5000 g | INTRAVENOUS | Status: AC
  Administered 2013-01-06: .75 g via INTRAVENOUS
  Administered 2013-01-06: 1.5 g via INTRAVENOUS
  Filled 2013-01-05 (×2): qty 1.5

## 2013-01-05 MED ORDER — EPINEPHRINE HCL 1 MG/ML IJ SOLN
0.5000 ug/min | INTRAVENOUS | Status: DC
  Filled 2013-01-05: qty 4

## 2013-01-05 MED ORDER — SODIUM CHLORIDE 0.9 % IV SOLN
INTRAVENOUS | Status: AC
  Administered 2013-01-06: 70 mL via INTRAVENOUS
  Filled 2013-01-05: qty 40

## 2013-01-05 MED ORDER — VANCOMYCIN HCL 1000 MG IV SOLR
INTRAVENOUS | Status: AC
  Administered 2013-01-06: 11:00:00
  Filled 2013-01-05: qty 1000

## 2013-01-05 MED ORDER — PLASMA-LYTE 148 IV SOLN
INTRAVENOUS | Status: DC
  Filled 2013-01-05: qty 2.5

## 2013-01-05 MED ORDER — VANCOMYCIN HCL 10 G IV SOLR
1500.0000 mg | INTRAVENOUS | Status: AC
  Administered 2013-01-06: 1500 mg via INTRAVENOUS
  Filled 2013-01-05: qty 1500

## 2013-01-05 MED ORDER — POTASSIUM CHLORIDE 2 MEQ/ML IV SOLN
80.0000 meq | INTRAVENOUS | Status: DC
  Filled 2013-01-05: qty 40

## 2013-01-05 MED ORDER — MAGNESIUM SULFATE 50 % IJ SOLN
40.0000 meq | INTRAMUSCULAR | Status: DC
  Filled 2013-01-05: qty 10

## 2013-01-05 MED ORDER — NITROGLYCERIN IN D5W 200-5 MCG/ML-% IV SOLN
2.0000 ug/min | INTRAVENOUS | Status: DC
  Filled 2013-01-05: qty 250

## 2013-01-05 MED ORDER — DEXTROSE 5 % IV SOLN
750.0000 mg | INTRAVENOUS | Status: DC
  Filled 2013-01-05: qty 750

## 2013-01-05 MED ORDER — SODIUM CHLORIDE 0.9 % IV SOLN
INTRAVENOUS | Status: AC
  Administered 2013-01-06: 1 [IU]/h via INTRAVENOUS
  Filled 2013-01-05: qty 1

## 2013-01-05 MED ORDER — DOPAMINE-DEXTROSE 3.2-5 MG/ML-% IV SOLN
2.0000 ug/kg/min | INTRAVENOUS | Status: DC
  Filled 2013-01-05: qty 250

## 2013-01-05 MED ORDER — PHENYLEPHRINE HCL 10 MG/ML IJ SOLN
30.0000 ug/min | INTRAVENOUS | Status: AC
  Administered 2013-01-06: 20 ug/min via INTRAVENOUS
  Filled 2013-01-05: qty 2

## 2013-01-05 MED ORDER — DEXMEDETOMIDINE HCL IN NACL 400 MCG/100ML IV SOLN
0.1000 ug/kg/h | INTRAVENOUS | Status: AC
  Administered 2013-01-06: 0.2 ug/kg/h via INTRAVENOUS
  Filled 2013-01-05: qty 100

## 2013-01-06 ENCOUNTER — Inpatient Hospital Stay (HOSPITAL_COMMUNITY): Payer: Medicare Other | Admitting: Anesthesiology

## 2013-01-06 ENCOUNTER — Encounter (HOSPITAL_COMMUNITY): Payer: Self-pay | Admitting: Anesthesiology

## 2013-01-06 ENCOUNTER — Encounter (HOSPITAL_COMMUNITY): Payer: Self-pay | Admitting: *Deleted

## 2013-01-06 ENCOUNTER — Inpatient Hospital Stay (HOSPITAL_COMMUNITY)
Admission: RE | Admit: 2013-01-06 | Discharge: 2013-01-10 | DRG: 220 | Disposition: A | Discharge status: Home / Self Care | Payer: Medicare Other | Source: Ambulatory Visit | Attending: Thoracic Surgery (Cardiothoracic Vascular Surgery) | Admitting: Thoracic Surgery (Cardiothoracic Vascular Surgery)

## 2013-01-06 ENCOUNTER — Inpatient Hospital Stay (HOSPITAL_COMMUNITY): Payer: Medicare Other

## 2013-01-06 ENCOUNTER — Encounter (HOSPITAL_COMMUNITY)
Admission: RE | Disposition: A | Discharge status: Home / Self Care | Payer: Self-pay | Source: Ambulatory Visit | Attending: Thoracic Surgery (Cardiothoracic Vascular Surgery)

## 2013-01-06 DIAGNOSIS — D696 Thrombocytopenia, unspecified: Secondary | ICD-10-CM | POA: Diagnosis not present

## 2013-01-06 DIAGNOSIS — I498 Other specified cardiac arrhythmias: Secondary | ICD-10-CM | POA: Diagnosis not present

## 2013-01-06 DIAGNOSIS — E785 Hyperlipidemia, unspecified: Secondary | ICD-10-CM | POA: Diagnosis present

## 2013-01-06 DIAGNOSIS — E8779 Other fluid overload: Secondary | ICD-10-CM | POA: Diagnosis present

## 2013-01-06 DIAGNOSIS — I10 Essential (primary) hypertension: Secondary | ICD-10-CM | POA: Diagnosis present

## 2013-01-06 DIAGNOSIS — Z79899 Other long term (current) drug therapy: Secondary | ICD-10-CM

## 2013-01-06 DIAGNOSIS — Z7982 Long term (current) use of aspirin: Secondary | ICD-10-CM

## 2013-01-06 DIAGNOSIS — I059 Rheumatic mitral valve disease, unspecified: Secondary | ICD-10-CM

## 2013-01-06 DIAGNOSIS — K219 Gastro-esophageal reflux disease without esophagitis: Secondary | ICD-10-CM | POA: Diagnosis present

## 2013-01-06 DIAGNOSIS — D62 Acute posthemorrhagic anemia: Secondary | ICD-10-CM | POA: Diagnosis not present

## 2013-01-06 DIAGNOSIS — Z9889 Other specified postprocedural states: Secondary | ICD-10-CM

## 2013-01-06 DIAGNOSIS — K2270 Barrett's esophagus without dysplasia: Secondary | ICD-10-CM | POA: Diagnosis present

## 2013-01-06 DIAGNOSIS — J9819 Other pulmonary collapse: Secondary | ICD-10-CM | POA: Diagnosis not present

## 2013-01-06 HISTORY — DX: Other specified postprocedural states: Z98.890

## 2013-01-06 HISTORY — DX: Pneumonia, unspecified organism: J18.9

## 2013-01-06 HISTORY — PX: MITRAL VALVE REPAIR: SHX2039

## 2013-01-06 HISTORY — PX: INTRAOPERATIVE TRANSESOPHAGEAL ECHOCARDIOGRAM: SHX5062

## 2013-01-06 LAB — HEMOGLOBIN AND HEMATOCRIT, BLOOD
HCT: 25.2 % — ABNORMAL LOW (ref 39.0–52.0)
Hemoglobin: 9 g/dL — ABNORMAL LOW (ref 13.0–17.0)

## 2013-01-06 LAB — POCT I-STAT 4, (NA,K, GLUC, HGB,HCT)
Glucose, Bld: 100 mg/dL — ABNORMAL HIGH (ref 70–99)
Glucose, Bld: 113 mg/dL — ABNORMAL HIGH (ref 70–99)
Glucose, Bld: 127 mg/dL — ABNORMAL HIGH (ref 70–99)
Glucose, Bld: 136 mg/dL — ABNORMAL HIGH (ref 70–99)
Glucose, Bld: 82 mg/dL (ref 70–99)
HCT: 34 % — ABNORMAL LOW (ref 39.0–52.0)
HCT: 24 % — ABNORMAL LOW (ref 39.0–52.0)
HCT: 26 % — ABNORMAL LOW (ref 39.0–52.0)
HCT: 30 % — ABNORMAL LOW (ref 39.0–52.0)
Hemoglobin: 9.5 g/dL — ABNORMAL LOW (ref 13.0–17.0)
Hemoglobin: 8.8 g/dL — ABNORMAL LOW (ref 13.0–17.0)
Hemoglobin: 10.2 g/dL — ABNORMAL LOW (ref 13.0–17.0)
Potassium: 3.7 mEq/L (ref 3.5–5.1)
Potassium: 4 mEq/L (ref 3.5–5.1)
Potassium: 4.9 mEq/L (ref 3.5–5.1)
Sodium: 141 mEq/L (ref 135–145)
Sodium: 140 mEq/L (ref 135–145)

## 2013-01-06 LAB — POCT I-STAT 3, ART BLOOD GAS (G3+)
Acid-base deficit: 1 mmol/L (ref 0.0–2.0)
Acid-base deficit: 7 mmol/L — ABNORMAL HIGH (ref 0.0–2.0)
Acid-base deficit: 10 mmol/L — ABNORMAL HIGH (ref 0.0–2.0)
Bicarbonate: 24.6 mEq/L — ABNORMAL HIGH (ref 20.0–24.0)
Bicarbonate: 16.8 mEq/L — ABNORMAL LOW (ref 20.0–24.0)
Bicarbonate: 15.2 mEq/L — ABNORMAL LOW (ref 20.0–24.0)
O2 Saturation: 100 %
O2 Saturation: 91 %
O2 Saturation: 95 %
O2 Saturation: 90 %
Patient temperature: 37.2
Patient temperature: 37.4
TCO2: 26 mmol/L (ref 0–100)
TCO2: 26 mmol/L (ref 0–100)
TCO2: 18 mmol/L (ref 0–100)
TCO2: 16 mmol/L (ref 0–100)
pCO2 arterial: 40.6 mmHg (ref 35.0–45.0)
pCO2 arterial: 37.4 mmHg (ref 35.0–45.0)
pH, Arterial: 7.333 — ABNORMAL LOW (ref 7.350–7.450)
pH, Arterial: 7.309 — ABNORMAL LOW (ref 7.350–7.450)
pO2, Arterial: 300 mmHg — ABNORMAL HIGH (ref 80.0–100.0)

## 2013-01-06 LAB — GLUCOSE, CAPILLARY
Glucose-Capillary: 73 mg/dL (ref 70–99)
Glucose-Capillary: 136 mg/dL — ABNORMAL HIGH (ref 70–99)

## 2013-01-06 LAB — CBC
HCT: 29.3 % — ABNORMAL LOW (ref 39.0–52.0)
Hemoglobin: 11.4 g/dL — ABNORMAL LOW (ref 13.0–17.0)
MCH: 34 pg (ref 26.0–34.0)
MCH: 34.1 pg — ABNORMAL HIGH (ref 26.0–34.0)
MCHC: 36.5 g/dL — ABNORMAL HIGH (ref 30.0–36.0)
MCHC: 37.1 g/dL — ABNORMAL HIGH (ref 30.0–36.0)
MCV: 93 fL (ref 78.0–100.0)
Platelets: 131 10*3/uL — ABNORMAL LOW (ref 150–400)
RDW: 12.5 % (ref 11.5–15.5)
RDW: 12.4 % (ref 11.5–15.5)

## 2013-01-06 LAB — MAGNESIUM: Magnesium: 2.9 mg/dL — ABNORMAL HIGH (ref 1.5–2.5)

## 2013-01-06 LAB — POCT I-STAT, CHEM 8
BUN: 11 mg/dL (ref 6–23)
Creatinine, Ser: 0.7 mg/dL (ref 0.50–1.35)
Glucose, Bld: 128 mg/dL — ABNORMAL HIGH (ref 70–99)
Sodium: 140 mEq/L (ref 135–145)
TCO2: 17 mmol/L (ref 0–100)

## 2013-01-06 LAB — PLATELET COUNT: Platelets: 160 10*3/uL (ref 150–400)

## 2013-01-06 LAB — CREATININE, SERUM: Creatinine, Ser: 0.95 mg/dL (ref 0.50–1.35)

## 2013-01-06 SURGERY — REPAIR, MITRAL VALVE, MINIMALLY INVASIVE
Anesthesia: General | Site: Chest | Laterality: Right | Wound class: Clean

## 2013-01-06 MED ORDER — KETOROLAC TROMETHAMINE 30 MG/ML IJ SOLN: 30.0000 mg | Freq: Once | INTRAMUSCULAR | Status: AC | PRN

## 2013-01-06 MED ORDER — OXYCODONE HCL 5 MG PO TABS
5.0000 mg | ORAL_TABLET | ORAL | Status: DC | PRN
  Administered 2013-01-07: 10 mg via ORAL
  Administered 2013-01-07: 5 mg via ORAL
  Administered 2013-01-07 (×3): 10 mg via ORAL
  Administered 2013-01-08: 5 mg via ORAL
  Administered 2013-01-08 – 2013-01-10 (×3): 10 mg via ORAL
  Filled 2013-01-06 (×3): qty 1
  Filled 2013-01-06 (×2): qty 2
  Filled 2013-01-06: qty 1
  Filled 2013-01-06 (×4): qty 2

## 2013-01-06 MED ORDER — LACTATED RINGERS IV SOLN
INTRAVENOUS | Status: DC | PRN
  Administered 2013-01-06 (×2): via INTRAVENOUS

## 2013-01-06 MED ORDER — FAMOTIDINE IN NACL 20-0.9 MG/50ML-% IV SOLN
20.0000 mg | Freq: Two times a day (BID) | INTRAVENOUS | Status: AC
  Administered 2013-01-06: 20 mg via INTRAVENOUS

## 2013-01-06 MED ORDER — LACTATED RINGERS IV SOLN
INTRAVENOUS | Status: DC | PRN
  Administered 2013-01-06 (×2): via INTRAVENOUS

## 2013-01-06 MED ORDER — FENTANYL CITRATE 0.05 MG/ML IJ SOLN
INTRAMUSCULAR | Status: DC | PRN
  Administered 2013-01-06: 150 ug via INTRAVENOUS
  Administered 2013-01-06 (×4): 100 ug via INTRAVENOUS
  Administered 2013-01-06: 50 ug via INTRAVENOUS
  Administered 2013-01-06: 100 ug via INTRAVENOUS
  Administered 2013-01-06: 150 ug via INTRAVENOUS
  Administered 2013-01-06: 100 ug via INTRAVENOUS

## 2013-01-06 MED ORDER — POTASSIUM CHLORIDE 10 MEQ/50ML IV SOLN: 10.0000 meq | INTRAVENOUS | Status: AC

## 2013-01-06 MED ORDER — LIDOCAINE HCL (CARDIAC) 20 MG/ML IV SOLN
INTRAVENOUS | Status: DC | PRN
  Administered 2013-01-06: 100 mg via INTRAVENOUS

## 2013-01-06 MED ORDER — PROTAMINE SULFATE 10 MG/ML IV SOLN
INTRAVENOUS | Status: DC | PRN
  Administered 2013-01-06: 1 mg via INTRAVENOUS
  Administered 2013-01-06: 27 mg via INTRAVENOUS

## 2013-01-06 MED ORDER — ASPIRIN 81 MG PO CHEW: 324.0000 mg | CHEWABLE_TABLET | Freq: Every day | ORAL | Status: DC

## 2013-01-06 MED ORDER — SODIUM CHLORIDE 0.9 % IJ SOLN: 3.0000 mL | INTRAMUSCULAR | Status: DC | PRN

## 2013-01-06 MED ORDER — CALCIUM CHLORIDE 10 % IV SOLN
1.0000 g | Freq: Once | INTRAVENOUS | Status: AC | PRN
  Filled 2013-01-06: qty 10

## 2013-01-06 MED ORDER — ACETAMINOPHEN 500 MG PO TABS
1000.0000 mg | ORAL_TABLET | Freq: Four times a day (QID) | ORAL | Status: DC
  Administered 2013-01-07 – 2013-01-10 (×13): 1000 mg via ORAL
  Filled 2013-01-06 (×19): qty 2

## 2013-01-06 MED ORDER — MIDAZOLAM HCL 2 MG/2ML IJ SOLN: 2.0000 mg | INTRAMUSCULAR | Status: DC | PRN

## 2013-01-06 MED ORDER — SODIUM CHLORIDE 0.9 % IV SOLN
5.0000 g/h | Freq: Once | INTRAVENOUS | Status: DC
  Filled 2013-01-06: qty 20

## 2013-01-06 MED ORDER — METOPROLOL TARTRATE 1 MG/ML IV SOLN: 2.5000 mg | INTRAVENOUS | Status: DC | PRN

## 2013-01-06 MED ORDER — 0.9 % SODIUM CHLORIDE (POUR BTL) OPTIME
TOPICAL | Status: DC | PRN
  Administered 2013-01-06: 6000 mL

## 2013-01-06 MED ORDER — DOPAMINE-DEXTROSE 3.2-5 MG/ML-% IV SOLN
INTRAVENOUS | Status: AC
  Filled 2013-01-06: qty 250

## 2013-01-06 MED ORDER — SODIUM CHLORIDE 0.9 % IV SOLN: 250.0000 mL | INTRAVENOUS | Status: DC

## 2013-01-06 MED ORDER — METOPROLOL TARTRATE 25 MG/10 ML ORAL SUSPENSION
12.5000 mg | Freq: Two times a day (BID) | ORAL | Status: DC
  Filled 2013-01-06 (×3): qty 5

## 2013-01-06 MED ORDER — METOPROLOL TARTRATE 12.5 MG HALF TABLET
12.5000 mg | ORAL_TABLET | Freq: Once | ORAL | Status: AC
  Administered 2013-01-06: 12.5 mg via ORAL

## 2013-01-06 MED ORDER — MIDAZOLAM HCL 5 MG/5ML IJ SOLN
INTRAMUSCULAR | Status: DC | PRN
  Administered 2013-01-06 (×2): 3 mg via INTRAVENOUS
  Administered 2013-01-06 (×2): 2 mg via INTRAVENOUS
  Administered 2013-01-06: 3 mg via INTRAVENOUS
  Administered 2013-01-06 (×3): 2 mg via INTRAVENOUS
  Administered 2013-01-06: 1 mg via INTRAVENOUS

## 2013-01-06 MED ORDER — BUPIVACAINE 0.5 % ON-Q PUMP SINGLE CATH 400 ML
INJECTION | Status: DC | PRN
  Administered 2013-01-06: 400 mL

## 2013-01-06 MED ORDER — METOPROLOL TARTRATE 12.5 MG HALF TABLET
12.5000 mg | ORAL_TABLET | Freq: Two times a day (BID) | ORAL | Status: DC
  Filled 2013-01-06 (×3): qty 1

## 2013-01-06 MED ORDER — DOPAMINE-DEXTROSE 3.2-5 MG/ML-% IV SOLN
2.0000 ug/kg/min | INTRAVENOUS | Status: AC
  Administered 2013-01-06: 3 ug/kg/min via INTRAVENOUS

## 2013-01-06 MED ORDER — ROCURONIUM BROMIDE 100 MG/10ML IV SOLN
INTRAVENOUS | Status: DC | PRN
  Administered 2013-01-06 (×6): 50 mg via INTRAVENOUS

## 2013-01-06 MED ORDER — ONDANSETRON HCL 4 MG/2ML IJ SOLN: 4.0000 mg | Freq: Four times a day (QID) | INTRAMUSCULAR | Status: DC | PRN

## 2013-01-06 MED ORDER — SODIUM CHLORIDE 0.45 % IV SOLN
INTRAVENOUS | Status: DC
  Administered 2013-01-06: 16:00:00 via INTRAVENOUS

## 2013-01-06 MED ORDER — PANTOPRAZOLE SODIUM 40 MG PO TBEC
40.0000 mg | DELAYED_RELEASE_TABLET | Freq: Every day | ORAL | Status: DC
  Administered 2013-01-08 – 2013-01-10 (×3): 40 mg via ORAL
  Filled 2013-01-06 (×4): qty 1

## 2013-01-06 MED ORDER — MORPHINE SULFATE 2 MG/ML IJ SOLN: 2.0000 mg | INTRAMUSCULAR | Status: DC | PRN

## 2013-01-06 MED ORDER — ACETAMINOPHEN 160 MG/5ML PO SOLN: 975.0000 mg | Freq: Four times a day (QID) | ORAL | Status: DC

## 2013-01-06 MED ORDER — MAGNESIUM SULFATE 40 MG/ML IJ SOLN
4.0000 g | Freq: Once | INTRAMUSCULAR | Status: AC
  Administered 2013-01-06: 4 g via INTRAVENOUS
  Filled 2013-01-06: qty 100

## 2013-01-06 MED ORDER — ACETAMINOPHEN 10 MG/ML IV SOLN
1000.0000 mg | Freq: Once | INTRAVENOUS | Status: AC
  Administered 2013-01-06: 1000 mg via INTRAVENOUS
  Filled 2013-01-06: qty 100

## 2013-01-06 MED ORDER — LACTATED RINGERS IV SOLN
INTRAVENOUS | Status: DC
  Administered 2013-01-06: 20 mL/h via INTRAVENOUS

## 2013-01-06 MED ORDER — SODIUM CHLORIDE 0.9 % IV SOLN
INTRAVENOUS | Status: DC | PRN
  Administered 2013-01-06: 15:00:00 via INTRAVENOUS

## 2013-01-06 MED ORDER — SODIUM CHLORIDE 0.9 % IV SOLN: INTRAVENOUS | Status: DC

## 2013-01-06 MED ORDER — HEPARIN SODIUM (PORCINE) 1000 UNIT/ML IJ SOLN
INTRAMUSCULAR | Status: DC | PRN
  Administered 2013-01-06: 28000 [IU] via INTRAVENOUS

## 2013-01-06 MED ORDER — BISACODYL 10 MG RE SUPP: 10.0000 mg | Freq: Every day | RECTAL | Status: DC

## 2013-01-06 MED ORDER — SODIUM CHLORIDE 0.9 % IJ SOLN
3.0000 mL | Freq: Two times a day (BID) | INTRAMUSCULAR | Status: DC
  Administered 2013-01-07 – 2013-01-09 (×3): 3 mL via INTRAVENOUS

## 2013-01-06 MED ORDER — PROPOFOL 10 MG/ML IV BOLUS
INTRAVENOUS | Status: DC | PRN
  Administered 2013-01-06: 100 mg via INTRAVENOUS

## 2013-01-06 MED ORDER — DEXMEDETOMIDINE HCL IN NACL 200 MCG/50ML IV SOLN
0.1000 ug/kg/h | INTRAVENOUS | Status: DC
  Administered 2013-01-06: 0.5 ug/kg/h via INTRAVENOUS
  Filled 2013-01-06 (×2): qty 50

## 2013-01-06 MED ORDER — MORPHINE SULFATE 2 MG/ML IJ SOLN
1.0000 mg | INTRAMUSCULAR | Status: AC | PRN
  Administered 2013-01-06: 2 mg via INTRAVENOUS
  Filled 2013-01-06 (×2): qty 1

## 2013-01-06 MED ORDER — INSULIN REGULAR BOLUS VIA INFUSION
0.0000 [IU] | Freq: Three times a day (TID) | INTRAVENOUS | Status: DC
  Filled 2013-01-06: qty 10

## 2013-01-06 MED ORDER — SODIUM CHLORIDE 0.9 % IV SOLN
INTRAVENOUS | Status: DC
  Administered 2013-01-07: 01:00:00 via INTRAVENOUS
  Filled 2013-01-06 (×2): qty 1

## 2013-01-06 MED ORDER — SODIUM CHLORIDE 0.9 % IR SOLN
Status: DC | PRN
  Administered 2013-01-06: 3000 mL

## 2013-01-06 MED ORDER — DEXTROSE 5 % IV SOLN
1.5000 g | Freq: Two times a day (BID) | INTRAVENOUS | Status: AC
  Administered 2013-01-06 – 2013-01-08 (×4): 1.5 g via INTRAVENOUS
  Filled 2013-01-06 (×4): qty 1.5

## 2013-01-06 MED ORDER — DEXTROSE 5 % IV SOLN
0.0000 ug/min | INTRAVENOUS | Status: DC
  Filled 2013-01-06: qty 2

## 2013-01-06 MED ORDER — BISACODYL 5 MG PO TBEC
10.0000 mg | DELAYED_RELEASE_TABLET | Freq: Every day | ORAL | Status: DC
  Administered 2013-01-07 – 2013-01-08 (×2): 10 mg via ORAL
  Filled 2013-01-06: qty 2

## 2013-01-06 MED ORDER — BUPIVACAINE 0.5 % ON-Q PUMP SINGLE CATH 400 ML
400.0000 mL | INJECTION | Status: DC
  Filled 2013-01-06: qty 400

## 2013-01-06 MED ORDER — ALBUMIN HUMAN 5 % IV SOLN
INTRAVENOUS | Status: DC | PRN
  Administered 2013-01-06: 14:00:00 via INTRAVENOUS

## 2013-01-06 MED ORDER — DOPAMINE-DEXTROSE 3.2-5 MG/ML-% IV SOLN: 0.0000 ug/kg/min | INTRAVENOUS | Status: DC

## 2013-01-06 MED ORDER — DOCUSATE SODIUM 100 MG PO CAPS
200.0000 mg | ORAL_CAPSULE | Freq: Every day | ORAL | Status: DC
  Administered 2013-01-07 – 2013-01-08 (×2): 200 mg via ORAL
  Filled 2013-01-06 (×4): qty 2

## 2013-01-06 MED ORDER — ARTIFICIAL TEARS OP OINT
TOPICAL_OINTMENT | OPHTHALMIC | Status: DC | PRN
  Administered 2013-01-06: 1 via OPHTHALMIC

## 2013-01-06 MED ORDER — ALBUMIN HUMAN 5 % IV SOLN
250.0000 mL | INTRAVENOUS | Status: AC | PRN
  Administered 2013-01-06 – 2013-01-07 (×2): 250 mL via INTRAVENOUS

## 2013-01-06 MED ORDER — NITROGLYCERIN IN D5W 200-5 MCG/ML-% IV SOLN: 0.0000 ug/min | INTRAVENOUS | Status: DC

## 2013-01-06 MED ORDER — ASPIRIN EC 325 MG PO TBEC
325.0000 mg | DELAYED_RELEASE_TABLET | Freq: Every day | ORAL | Status: DC
  Administered 2013-01-07: 325 mg via ORAL
  Filled 2013-01-06 (×2): qty 1

## 2013-01-06 MED ORDER — VANCOMYCIN HCL IN DEXTROSE 1-5 GM/200ML-% IV SOLN
1000.0000 mg | Freq: Once | INTRAVENOUS | Status: AC
  Administered 2013-01-06: 1000 mg via INTRAVENOUS
  Filled 2013-01-06: qty 200

## 2013-01-06 MED ORDER — LACTATED RINGERS IV SOLN: 500.0000 mL | Freq: Once | INTRAVENOUS | Status: AC | PRN

## 2013-01-06 SURGICAL SUPPLY — 123 items
ADAPTER CARDIO PERF ANTE/RETRO (ADAPTER) ×3 IMPLANT
BAG DECANTER FOR FLEXI CONT (MISCELLANEOUS) ×3 IMPLANT
BENZOIN TINCTURE PRP APPL 2/3 (GAUZE/BANDAGES/DRESSINGS) ×3 IMPLANT
BLADE SURG 11 STRL SS (BLADE) ×3 IMPLANT
CANISTER SUCTION 2500CC (MISCELLANEOUS) ×6 IMPLANT
CANNULA FEM VENOUS REMOTE 22FR (CANNULA) IMPLANT
CANNULA FEMORAL ART 14 SM (MISCELLANEOUS) ×3 IMPLANT
CANNULA GUNDRY RCSP 15FR (MISCELLANEOUS) ×3 IMPLANT
CANNULA OPTISITE PERFUSION 16F (CANNULA) IMPLANT
CANNULA OPTISITE PERFUSION 18F (CANNULA) ×3 IMPLANT
CARDIAC SUCTION (MISCELLANEOUS) ×3 IMPLANT
CATH KIT ON Q 5IN SLV (PAIN MANAGEMENT) ×3 IMPLANT
CLOTH BEACON ORANGE TIMEOUT ST (SAFETY) ×3 IMPLANT
CONN ST 1/4X3/8  BEN (MISCELLANEOUS) ×2
CONN ST 1/4X3/8 BEN (MISCELLANEOUS) ×4 IMPLANT
CONT SPEC 4OZ CLIKSEAL STRL BL (MISCELLANEOUS) ×3 IMPLANT
CONT SPEC STER OR (MISCELLANEOUS) ×3 IMPLANT
COVER MAYO STAND STRL (DRAPES) ×3 IMPLANT
COVER PROBE W GEL 5X96 (DRAPES) ×3 IMPLANT
COVER SURGICAL LIGHT HANDLE (MISCELLANEOUS) ×6 IMPLANT
CRADLE DONUT ADULT HEAD (MISCELLANEOUS) ×3 IMPLANT
DERMABOND ADVANCED (GAUZE/BANDAGES/DRESSINGS) ×2
DERMABOND ADVANCED .7 DNX12 (GAUZE/BANDAGES/DRESSINGS) ×4 IMPLANT
DEVICE PMI PUNCTURE CLOSURE (MISCELLANEOUS) ×3 IMPLANT
DEVICE TROCAR PUNCTURE CLOSURE (ENDOMECHANICALS) ×3 IMPLANT
DRAIN CHANNEL 28F RND 3/8 FF (WOUND CARE) ×6 IMPLANT
DRAPE BILATERAL SPLIT (DRAPES) ×3 IMPLANT
DRAPE C-ARM 42X72 X-RAY (DRAPES) ×3 IMPLANT
DRAPE CV SPLIT W-CLR ANES SCRN (DRAPES) ×3 IMPLANT
DRAPE INCISE IOBAN 66X45 STRL (DRAPES) ×6 IMPLANT
DRAPE SLUSH MACHINE 52X66 (DRAPES) IMPLANT
DRAPE SLUSH/WARMER DISC (DRAPES) ×3 IMPLANT
DRSG COVADERM 4X14 (GAUZE/BANDAGES/DRESSINGS) ×3 IMPLANT
DRSG COVADERM 4X8 (GAUZE/BANDAGES/DRESSINGS) ×3 IMPLANT
ELECT BLADE 6.5 EXT (BLADE) ×3 IMPLANT
ELECT REM PT RETURN 9FT ADLT (ELECTROSURGICAL) ×6
ELECTRODE REM PT RTRN 9FT ADLT (ELECTROSURGICAL) ×4 IMPLANT
FEMORAL VENOUS CANN RAP (CANNULA) ×3 IMPLANT
GLOVE BIO SURGEON STRL SZ 6 (GLOVE) ×3 IMPLANT
GLOVE BIO SURGEON STRL SZ 6.5 (GLOVE) ×9 IMPLANT
GLOVE BIO SURGEON STRL SZ8.5 (GLOVE) ×6 IMPLANT
GLOVE BIOGEL PI IND STRL 6 (GLOVE) ×6 IMPLANT
GLOVE BIOGEL PI INDICATOR 6 (GLOVE) ×3
GLOVE ORTHO TXT STRL SZ7.5 (GLOVE) ×9 IMPLANT
GOWN STRL NON-REIN LRG LVL3 (GOWN DISPOSABLE) ×24 IMPLANT
GUIDEWIRE ANG ZIPWIRE 038X150 (WIRE) IMPLANT
INSERT CONFORM CROSS CLAMP 66M (MISCELLANEOUS) IMPLANT
INSERT CONFORM CROSS CLAMP 86M (MISCELLANEOUS) IMPLANT
KIT BASIN OR (CUSTOM PROCEDURE TRAY) ×3 IMPLANT
KIT DILATOR VASC 18G NDL (KITS) ×3 IMPLANT
KIT DRAINAGE VACCUM ASSIST (KITS) ×3 IMPLANT
KIT ROOM TURNOVER OR (KITS) ×3 IMPLANT
KIT SUCTION CATH 14FR (SUCTIONS) ×3 IMPLANT
LEAD PACING MYOCARDI (MISCELLANEOUS) ×3 IMPLANT
LINE VENT (MISCELLANEOUS) ×3 IMPLANT
NEEDLE AORTIC ROOT 14G 7F (CATHETERS) ×3 IMPLANT
NS IRRIG 1000ML POUR BTL (IV SOLUTION) ×18 IMPLANT
PACK OPEN HEART (CUSTOM PROCEDURE TRAY) ×3 IMPLANT
PAD ARMBOARD 7.5X6 YLW CONV (MISCELLANEOUS) ×6 IMPLANT
PAD ELECT DEFIB RADIOL ZOLL (MISCELLANEOUS) ×3 IMPLANT
PATCH CORMATRIX 4CMX7CM (Prosthesis & Implant Heart) ×3 IMPLANT
RETRACTOR PVM SOFT TISSUE M (INSTRUMENTS) ×3 IMPLANT
RETRACTOR TRL SOFT TISSUE LG (INSTRUMENTS) IMPLANT
RETRACTOR TRM SOFT TISSUE 7.5 (INSTRUMENTS) IMPLANT
RING MITRAL MEMO 3D 34MM SMD34 (Prosthesis & Implant Heart) ×3 IMPLANT
SET CANNULATION TOURNIQUET (MISCELLANEOUS) ×3 IMPLANT
SET CARDIOPLEGIA MPS 5001102 (MISCELLANEOUS) ×3 IMPLANT
SET IRRIG TUBING LAPAROSCOPIC (IRRIGATION / IRRIGATOR) ×3 IMPLANT
SOLUTION ANTI FOG 6CC (MISCELLANEOUS) ×3 IMPLANT
SPONGE GAUZE 4X4 12PLY (GAUZE/BANDAGES/DRESSINGS) ×3 IMPLANT
SUCKER WEIGHTED FLEX (MISCELLANEOUS) ×6 IMPLANT
SUT BONE WAX W31G (SUTURE) ×3 IMPLANT
SUT E-PACK MINIMALLY INVASIVE (SUTURE) ×3 IMPLANT
SUT ETHIBOND (SUTURE) ×6 IMPLANT
SUT ETHIBOND 2 0 SH (SUTURE) ×6 IMPLANT
SUT ETHIBOND 2 0 V4 (SUTURE) IMPLANT
SUT ETHIBOND 2 0V4 GREEN (SUTURE) IMPLANT
SUT ETHIBOND 2-0 RB-1 WHT (SUTURE) ×6 IMPLANT
SUT ETHIBOND 4 0 TF (SUTURE) IMPLANT
SUT ETHIBOND 5 0 C 1 30 (SUTURE) IMPLANT
SUT ETHIBOND NAB MH 2-0 36IN (SUTURE) IMPLANT
SUT ETHIBOND X763 2 0 SH 1 (SUTURE) ×3 IMPLANT
SUT GORETEX 6.0 TH-9 30 IN (SUTURE) IMPLANT
SUT GORETEX CV 4 TH 22 36 (SUTURE) ×3 IMPLANT
SUT GORETEX CV-5THC-13 36IN (SUTURE) ×36 IMPLANT
SUT GORETEX CV4 TH-18 (SUTURE) ×15 IMPLANT
SUT GORETEX TH-18 36 INCH (SUTURE) IMPLANT
SUT MNCRL AB 3-0 PS2 18 (SUTURE) IMPLANT
SUT PROLENE 3 0 SH DA (SUTURE) IMPLANT
SUT PROLENE 3 0 SH1 36 (SUTURE) ×3 IMPLANT
SUT PROLENE 4 0 RB 1 (SUTURE)
SUT PROLENE 4-0 RB1 .5 CRCL 36 (SUTURE) IMPLANT
SUT PROLENE 5 0 C 1 36 (SUTURE) ×3 IMPLANT
SUT PROLENE 6 0 C 1 30 (SUTURE) ×3 IMPLANT
SUT SILK  1 MH (SUTURE)
SUT SILK 1 MH (SUTURE) IMPLANT
SUT SILK 1 TIES 10X30 (SUTURE) IMPLANT
SUT SILK 2 0 SH CR/8 (SUTURE) IMPLANT
SUT SILK 2 0 TIES 10X30 (SUTURE) IMPLANT
SUT SILK 2 0SH CR/8 30 (SUTURE) IMPLANT
SUT SILK 3 0 (SUTURE)
SUT SILK 3 0 SH CR/8 (SUTURE) IMPLANT
SUT SILK 3 0SH CR/8 30 (SUTURE) IMPLANT
SUT SILK 3-0 18XBRD TIE 12 (SUTURE) IMPLANT
SUT TEM PAC WIRE 2 0 SH (SUTURE) IMPLANT
SUT VIC AB 2-0 CTX 36 (SUTURE) IMPLANT
SUT VIC AB 2-0 UR6 27 (SUTURE) IMPLANT
SUT VIC AB 3-0 SH 8-18 (SUTURE) ×3 IMPLANT
SUT VICRYL 2 TP 1 (SUTURE) IMPLANT
SYRINGE 10CC LL (SYRINGE) ×3 IMPLANT
SYSTEM SAHARA CHEST DRAIN ATS (WOUND CARE) ×3 IMPLANT
TAPE CLOTH SURG 4X10 WHT LF (GAUZE/BANDAGES/DRESSINGS) ×3 IMPLANT
TAPE PAPER 2X10 WHT MICROPORE (GAUZE/BANDAGES/DRESSINGS) ×3 IMPLANT
TOWEL OR 17X24 6PK STRL BLUE (TOWEL DISPOSABLE) ×3 IMPLANT
TOWEL OR 17X26 10 PK STRL BLUE (TOWEL DISPOSABLE) ×3 IMPLANT
TRAY FOLEY IC TEMP SENS 14FR (CATHETERS) ×3 IMPLANT
TROCAR XCEL BLADELESS 5X75MML (TROCAR) ×3 IMPLANT
TROCAR XCEL NON-BLD 11X100MML (ENDOMECHANICALS) ×6 IMPLANT
TUBE SUCT INTRACARD DLP 20F (MISCELLANEOUS) ×3 IMPLANT
TUNNELER SHEATH ON-Q 11GX8 (MISCELLANEOUS) ×3 IMPLANT
UNDERPAD 30X30 INCONTINENT (UNDERPADS AND DIAPERS) ×3 IMPLANT
WATER STERILE IRR 1000ML POUR (IV SOLUTION) ×6 IMPLANT
WIRE BENTSON .035X145CM (WIRE) ×3 IMPLANT

## 2013-01-06 NOTE — OR Nursing (Signed)
Family update @ 1020

## 2013-01-06 NOTE — Interval H&P Note (Signed)
History and Physical Interval Note:  01/06/2013 7:48 AM  James [name]  has presented today for surgery, with the diagnosis of MR  The various methods of treatment have been discussed with the patient and family. After consideration of risks, benefits and other options for treatment, the patient has consented to  Procedure(s): MINIMALLY INVASIVE MITRAL VALVE REPAIR (MVR) (Right) INTRAOPERATIVE TRANSESOPHAGEAL ECHOCARDIOGRAM (N/A) as a surgical intervention .  The patient's history has been reviewed, patient examined, no change in status, stable for surgery.  I have reviewed the patient's chart and labs.  Questions were answered to the patient's satisfaction.     [name]

## 2013-01-06 NOTE — Transfer of Care (Signed)
Immediate Anesthesia Transfer of Care Note  Patient: James [name]  Procedure(s) Performed: Procedure(s): MINIMALLY INVASIVE MITRAL VALVE REPAIR (MVR) (Right) INTRAOPERATIVE TRANSESOPHAGEAL ECHOCARDIOGRAM (N/A)  Patient Location: ICU  Anesthesia Type:General  Level of Consciousness: sedated and Patient remains intubated per anesthesia plan  Airway & Oxygen Therapy: Patient remains intubated per anesthesia plan and Patient placed on Ventilator (see vital sign flow sheet for setting)  Post-op Assessment: Report given to PACU RN  Post vital signs: Reviewed and stable  Complications: No apparent anesthesia complications

## 2013-01-06 NOTE — Progress Notes (Signed)
Reported off to [place], Charity fundraiser. No acute distress noted. Safety maintained.

## 2013-01-06 NOTE — Procedures (Signed)
Extubation Procedure Note  Patient Details:   Name: James [name] DOB: 1945-12-20 MRN: [id number]   Airway Documentation:  Airway 8 mm (Active)  Secured at (cm) 25 cm 01/06/2013  8:10 PM  Measured From Lips 01/06/2013  8:10 PM  Secured Location Right 01/06/2013  8:10 PM  Secured By Pink Tape 01/06/2013  8:10 PM  Site Condition Dry 01/06/2013  8:10 PM     Airway 8 mm (Active)  Secured at (cm) 25 cm 01/06/2013 12:00 AM    Evaluation  O2 sats: stable throughout and currently acceptable Complications: No apparent complications Patient did tolerate procedure well. Bilateral Breath Sounds: Rhonchi Suctioning: Airway Yes  Pt was extubated to 4 lpm nasal cannula via rapid wean protocol. Pt's mechanics were NIF -20 and VC 1.28 liters. Positive cuff leak. No stridor noted. Pt clear ability to speak, in no apparent distress. ABG    Component Value Date/Time   PHART 7.384 01/06/2013 2042   PCO2ART 28.1* 01/06/2013 2042   PO2ART 78.0* 01/06/2013 2042   HCO3 16.8* 01/06/2013 2042   TCO2 18 01/06/2013 2042   ACIDBASEDEF 7.0* 01/06/2013 2042   O2SAT 95.0 01/06/2013 2042     HUDSON, Iosefa Weintraub N 01/06/2013, 9:01 PM

## 2013-01-06 NOTE — Op Note (Signed)
CARDIOTHORACIC SURGERY OPERATIVE NOTE  Date of Procedure:  01/06/2013  Preoperative Diagnosis: Severe Mitral Regurgitation  Postoperative Diagnosis: Same  Procedure:    Minimally-Invasive Mitral Valve Repair  Complex valvuloplasty including triangular resection of flail segment of posterior leaflet  Artificial Gore-tex neocord placement x4  Sorin Memo 3D Ring Annuloplasty (size 34mm, catalog # [id number], serial # [id number])  Surgeon: James [name]. 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 He was diagnosed with mitral valve prolapse and mitral regurgitation several years ago, and up until recently he had been followed in [place] with regular echocardiograms. Approximately one year ago he was seen by his cardiologist in [place] and told that the severity of mitral regurgitation have gotten worse and that the size of the heart had also increased. The patient has recently retired and moved [place] and now established care locally with Dr. Valentina [name] and Dr. Anne [name]. Followup transthoracic echocardiogram was performed demonstrating mitral valve prolapse with severe mitral regurgitation. The patient subsequently underwent transesophageal echocardiogram confirmed the presence of severe mitral regurgitation with flail scallop of the posterior leaflet. Left ventricular size and systolic function remains normal. The patient was referred  for elective surgical consultation. He was originally seen in consultation on 12/04/2012.  Since then he has undergone left and right heart catheterization by Dr. Anne [name] on 12/15/2012. Cardiac catheterization was notable for the absence of significant coronary artery disease. There was severe mitral regurgitation with normal pulmonary artery pressures.  The patient has been seen in consultation and counseled at length regarding the indications, risks and potential benefits of surgery.  All questions have been answered, and the patient provides full informed consent for the operation as described.     DETAILS OF THE OPERATIVE PROCEDURE  The patient is brought to the operating room on the above mentioned date and central monitoring was established by the anesthesia team including placement of Swan-Ganz catheter through the left internal jugular vein.  A radial arterial line is placed. The patient is placed in the supine position on the operating table.  Intravenous antibiotics are administered. General endotracheal anesthesia is induced uneventfully. The patient is initially intubated using a dual lumen endotracheal tube.  A Foley catheter is placed.  Baseline transesophageal echocardiogram was performed.  Findings were notable for bileaflet prolapse of the mitral valve with severe (4+) mitral regurgitation. There is a flail segment of the posterior leaflet of the mitral valve with an eccentric jet of mitral regurgitation that courses anteriorly around the left atrium. There is blunting during systole of flow in all 4 pulmonary veins. There is normal left ventricular size and systolic function. Aortic valve appears normal. No other significant abnormalities are noted.  A soft roll is placed behind the patient's left scapula and the neck gently extended and turned to the left.   The patient's right neck, chest, abdomen, both groins, and both lower extremities  are prepared and draped in a sterile manner. A  time out procedure is performed.  A small incision is made in the right inguinal crease and the anterior surface of the right common femoral artery and right common femoral vein are identified.  A right miniature anterolateral thoracotomy incision is performed. The incision is placed just lateral to and superior to the right nipple. The pectoralis major muscle is retracted medially and completely preserved. The right pleural space is entered through the 3rd intercostal space. A soft tissue retractor is placed.  Two 11 mm ports are placed through separate stab incisions inferiorly. The right pleural space is insufflated continuously with carbon dioxide gas through the posterior port during the remainder of the operation.  A pledgeted sutures placed through the dome of the right hemidiaphragm and retracted inferiorly to facilitate exposure.  A longitudinal incision is made in the pericardium 3 cm anterior to the phrenic nerve and silk traction sutures are placed on either side of the incision for exposure.  The patient is placed in Trendelenburg position. The right internal jugular vein is cannulated with Seldinger technique and a guidewire advanced into the right atrium. The patient is heparinized systemically. The right internal jugular vein is cannulated with a 14 [place] pediatric femoral venous cannula. Pursestring sutures are placed on the anterior surface of the right common femoral vein and right common femoral artery. The right common femoral vein is cannulated with the Seldinger technique and a guidewire is advanced under transesophageal echocardiogram guidance through the right atrium. The femoral vein is cannulated with a long 22 French femoral venous cannula. The right common femoral artery is cannulated with Seldinger technique and a flexible guidewire is advanced until it can be appreciated intraluminally in the descending thoracic aorta on transesophageal echocardiogram. The femoral artery is  cannulated with an 18 French femoral arterial cannula.  Adequate heparinization is verified.     The entire pre-bypass portion of the operation was notable for stable hemodynamics.  Cardiopulmonary bypass was begun.  Vacuum assist venous drainage is utilized. The incision in the pericardium is extended in both directions. Venous drainage and exposure are notably excellent. A retrograde cardioplegia cannula is placed through the right atrium into the coronary sinus using transesophageal echocardiogram guidance.  An antegrade cardioplegia cannula is placed in the ascending aorta.    The patient is cooled to 28C systemic temperature.  The aortic cross clamp is applied and cold blood cardioplegia is delivered initially in an antegrade fashion through the aortic root.   Supplemental cardioplegia is given retrograde through the coronary sinus catheter. The initial cardioplegic arrest is rapid with early diastolic arrest.  Repeat doses of cardioplegia are administered intermittently every 20 to 30 minutes throughout the entire cross clamp portion of the operation through the aortic root and through the coronary sinus catheter in order to maintain completely flat electrocardiogram.  Myocardial protection was felt to be excellent.  A left atriotomy incision was performed through the interatrial groove and extended partially across the back wall of the left atrium after opening the oblique sinus inferiorly.  The mitral valve is exposed using a self-retaining retractor.   Findings were notable for form fruste variant of Barlow's disease with moderately redundant leaflet tissue, particularly involving the posterior leaflet. There is bileaflet prolapse with severe prolapse of the posterior leaflet and a flail segment corresponding to the anterior portion of P2 with multiple ruptured primary cords. In this particular patient P2 is actually divided into 2 separate segments with a  deep cleft separating the portion with  cords arising from that anterior papillary muscle from the portion with cords arising from the posterior papillary muscle. The flail segment corresponds to the segment with cords arising from the anterior papillary muscle.  There is no annular calcification. No other significant abnormalities are noted.  Interrupted 2-0 Ethibond horizontal mattress sutures are placed circumferentially around the entire mitral valve annulus. The sutures will ultimately be utilized for ring annuloplasty, and at this juncture there are utilized to suspend the valve symmetrically.  A pledgeted CV-4 Gore-Tex suture is placed through the head of the anterior papillary muscle and the suture tied. The 2 strands are left for later placement into the posterior leaflet for artificial cord replacement. A second pledgeted CV-4 Gore-Tex sutures placed through the head of the anterior papillary muscle and tied in a similar fashion.  The flail portion of P2 is repaired using a triangular resection. Approximately 20% of the total surface area of P2 is resected. The intervening vertical defect in the leaflet is closed with interrupted CV-5 inverting simple Gore-Tex sutures.  The valve was tested with saline and appeared competent even without ring annuloplasty complete. The valve was sized to a 34 mm annuloplasty ring, based upon the transverse distance between the left and right commissures and the height of the anterior leaflet, corresponding to a size just slightly larger than the overall surface area of the anterior leaflet.  A Sorin Memo 3D annuloplasty ring (size 34mm, catalog [id number], serial [id number]) was secured in place uneventfully. The valve was tested with saline and appeared competent.   The individual limbs of the Goretex neocords were retrieved from the LV chamber, woven into the posterior leaflet beginning at the free margin where they were placed from the ventricular surface to the atrial surface, and then woven in a diamond  shaped fashion towards the posterior mitral annulus.  The Goretex sutures were then tied while the LV was distended with saline so as to adjust the length of the neocords to the appropriate length.  The 2 cords from the anterior papillary muscle are woven into the P2 segment that has been repaired with the triangular resection. The 2 cords from the posterior pack per muscle are woven into the prolapsing portion of P2 with cords arising from the posterior papillary muscle, closure to the posterior commissure and P3.  The valve is again tested with saline and appears to be perfectly competent with a broad symmetrical line of coaptation of the anterior and posterior leaflet. There is no residual leak. There was a broad, symmetrical line of coaptation of the anterior and posterior leaflet which was confirmed using the blue ink test.  Rewarming is begun.  The atriotomy was closed using a 2-layer closure of running 3-0 Prolene suture after placing a sump drain across the mitral valve to serve as a left ventricular vent.  One final dose of warm retrograde "hot shot" cardioplegia was administered retrograde through the coronary sinus catheter while all air was evacuated through the aortic root.  The aortic cross clamp was removed after a total cross clamp time of 140 minutes.  Epicardial pacing wires are fixed to the inferior wall of the right ventricule and to the right atrial appendage. The patient is rewarmed to 37C temperature. The left ventricular vent is removed.  The patient is ventilated and flow volumes turndown while the mitral valve repair is inspected using transesophageal echocardiogram. The valve repair appears intact with no residual leak. The antegrade cardioplegia cannula  is now removed. The patient is weaned and disconnected from cardiopulmonary bypass.  The patient's rhythm at separation from bypass was AV paced.  The patient was weaned from bypass without any inotropic support. Total  cardiopulmonary bypass time for the operation was 186 minutes.  Followup transesophageal echocardiogram performed after separation from bypass revealed a well-seated annuloplasty ring in the mitral position with a normal functioning mitral valve. There was no residual leak.  Left ventricular function was unchanged from preoperatively.    The femoral arterial and venous cannulae were removed uneventfully. There was a palpable pulse in the distal right common femoral artery after removal of the cannula. Protamine was administered to reverse the anticoagulation. The right internal jugular cannula was removed and manual pressure held on the neck for 15 minutes.  Single lung ventilation was begun. The atriotomy closure was inspected for hemostasis. The pericardial sac was drained using a 28 French Bard drain placed through the anterior port incision.  The pericardium was closed using a patch of core matrix bovine submucosal tissue patch. The right pleural space is irrigated with saline solution and inspected for hemostasis. The right pleural space was drained using a 28 French Bard drain placed through the posterior port incision. The miniature thoracotomy incision was closed in multiple layers in routine fashion. The right groin incision was inspected for hemostasis and closed in multiple layers in routine fashion.  The patient tolerated the procedure well.  The patient was reintubated using a single lumen endotracheal tube and subsequently transported to the surgical intensive care unit in stable condition. There were no intraoperative complications. All sponge instrument and needle counts are verified correct at completion of the operation.  The post-bypass portion of the operation was notable for stable rhythm and hemodynamics.  No blood products were administered during the operation.   James [name]. James Moras MD 01/06/2013 4:00 PM

## 2013-01-06 NOTE — Progress Notes (Signed)
*  PRELIMINARY RESULTS* Echocardiogram TEE has been performed.  James [name] 01/06/2013, 10:03 AM

## 2013-01-06 NOTE — OR Nursing (Signed)
15:10 - 2nd call to SICU - (Doris-=secretary)

## 2013-01-06 NOTE — Anesthesia Procedure Notes (Addendum)
Performed by: Theodosia Quay   Procedure Name: Intubation Date/Time: 01/06/2013 8:52 AM Performed by: Bruin Bolger, Nuala Alpha Pre-anesthesia Checklist: Patient identified, Emergency Drugs available, Suction available and Patient being monitored Patient Re-evaluated:Patient Re-evaluated prior to inductionOxygen Delivery Method: Circle system utilized Preoxygenation: Pre-oxygenation with 100% oxygen Intubation Type: IV induction Ventilation: Mask ventilation without difficulty and Oral airway inserted - appropriate to patient size Laryngoscope Size: Mac and 4 Grade View: Grade II Tube type: Oral Endobronchial tube: Double lumen EBT and 39 Fr Number of attempts: 2 Airway Equipment and Method: Stylet and Oral airway (tube placement confirmed by FO scope by  [name], MD) Placement Confirmation: ETT inserted through vocal cords under direct vision,  positive ETCO2 and breath sounds checked- equal and bilateral Dental Injury: Teeth and Oropharynx as per pre-operative assessment  Comments: DLx1 with MAC4 by [name]. Grade 1 view. Unable to pass tube through cords. DLx1 with MAC4 by [name], [name].    : James [name]: [id number]: Procedures: Right IJ Theone Murdoch Catheter Insertion: The patient was identified and consent obtained.  TO was performed, and full barrier precautions were used.  The skin was anesthetized with lidocaine-4cc plain with 25g needle.  Once the vein was located with the 22 ga. needle using ultrasound guidance , the wire was inserted into the vein.  The wire location was confirmed with ultrasound.  The tissue was dilated and the 8.5 [place] cordis catheter was carefully inserted. Afterwards Theone Murdoch catheter was inserted. The patient tolerated the procedure well.   CE

## 2013-01-06 NOTE — Preoperative (Signed)
Beta Blockers   Reason not to administer Beta Blockers:Not Applicable 

## 2013-01-06 NOTE — Brief Op Note (Signed)
01/06/2013  3:18 PM  PATIENT:  James [name]  67 y.o. 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

## 2013-01-06 NOTE — OR Nursing (Signed)
14:45pm - call to vol. Desk to inform family off pump, 1st call to SICU.

## 2013-01-06 NOTE — Progress Notes (Signed)
Increased fio2 due to po2-58.

## 2013-01-06 NOTE — Anesthesia Preprocedure Evaluation (Addendum)
Anesthesia Evaluation  Patient identified by MRN, date of birth, ID band Patient awake    Reviewed: H&P , NPO status   Airway Mallampati: I  Neck ROM: Full    Dental  (+) Teeth Intact   Pulmonary  breath sounds clear to auscultation        Cardiovascular hypertension, + Valvular Problems/Murmurs MR Rhythm:Regular Rate:Normal + Systolic murmurs    Neuro/Psych    GI/Hepatic GERD-  ,Barretts esophagus   Endo/Other    Renal/GU      Musculoskeletal   Abdominal   Peds  Hematology   Anesthesia Other Findings   Reproductive/Obstetrics                          Anesthesia Physical Anesthesia Plan  ASA: III  Anesthesia Plan: General   Post-op Pain Management:    Induction: Intravenous  Airway Management Planned: Oral ETT  Additional Equipment: Arterial line, PA Cath and TEE  Intra-op Plan:   Post-operative Plan: Post-operative intubation/ventilation  Informed Consent: I have reviewed the patients History and Physical, chart, labs and discussed the procedure including the risks, benefits and alternatives for the proposed anesthesia with the patient or authorized representative who has indicated his/her understanding and acceptance.   Dental advisory given  Plan Discussed with: [name] and Surgeon  Anesthesia Plan Comments:         Anesthesia Quick Evaluation

## 2013-01-06 NOTE — Progress Notes (Signed)
[facility] BRIEF SICU PROGRESS NOTE  Day of Surgery  S/P Procedure(s) (LRB): MINIMALLY INVASIVE MITRAL VALVE REPAIR (MVR) (Right) INTRAOPERATIVE TRANSESOPHAGEAL ECHOCARDIOGRAM (N/A)   Starting to wake on vent Sinus brady - AAI paced Stable hemodynamics Minimal chest tube output UOP excellent Labs okay w/ Hgb 10.7  Plan: Continue routine early postop  James [name] H 01/06/2013 7:09 PM

## 2013-01-07 ENCOUNTER — Inpatient Hospital Stay (HOSPITAL_COMMUNITY): Payer: Medicare Other

## 2013-01-07 ENCOUNTER — Encounter (HOSPITAL_COMMUNITY): Payer: Self-pay | Admitting: Thoracic Surgery (Cardiothoracic Vascular Surgery)

## 2013-01-07 LAB — CBC
HCT: 30.5 % — ABNORMAL LOW (ref 39.0–52.0)
Hemoglobin: 11.1 g/dL — ABNORMAL LOW (ref 13.0–17.0)
Hemoglobin: 9.8 g/dL — ABNORMAL LOW (ref 13.0–17.0)
MCHC: 36 g/dL (ref 30.0–36.0)
RDW: 12.4 % (ref 11.5–15.5)
RDW: 12.8 % (ref 11.5–15.5)
WBC: 10.2 10*3/uL (ref 4.0–10.5)
WBC: 10.1 10*3/uL (ref 4.0–10.5)

## 2013-01-07 LAB — CREATININE, SERUM
Creatinine, Ser: 0.93 mg/dL (ref 0.50–1.35)
GFR calc Af Amer: >90 mL/min (ref >90)
GFR calc non Af Amer: 86 mL/min — ABNORMAL LOW (ref >90)

## 2013-01-07 LAB — GLUCOSE, CAPILLARY
Glucose-Capillary: 106 mg/dL — ABNORMAL HIGH (ref 70–99)
Glucose-Capillary: 106 mg/dL — ABNORMAL HIGH (ref 70–99)
Glucose-Capillary: 107 mg/dL — ABNORMAL HIGH (ref 70–99)
Glucose-Capillary: 105 mg/dL — ABNORMAL HIGH (ref 70–99)
Glucose-Capillary: 155 mg/dL — ABNORMAL HIGH (ref 70–99)
Glucose-Capillary: 102 mg/dL — ABNORMAL HIGH (ref 70–99)
Glucose-Capillary: 98 mg/dL (ref 70–99)

## 2013-01-07 LAB — BASIC METABOLIC PANEL
BUN: 9 mg/dL (ref 6–23)
Chloride: 106 mEq/L (ref 96–112)
Creatinine, Ser: 0.9 mg/dL (ref 0.50–1.35)
GFR calc Af Amer: >90 mL/min (ref >90)
GFR calc non Af Amer: 87 mL/min — ABNORMAL LOW (ref >90)
Potassium: 3.6 mEq/L (ref 3.5–5.1)

## 2013-01-07 LAB — POCT I-STAT, CHEM 8
Calcium, Ion: 1.15 mmol/L (ref 1.13–1.30)
Hemoglobin: 9.2 g/dL — ABNORMAL LOW (ref 13.0–17.0)
Sodium: 138 mEq/L (ref 135–145)
TCO2: 22 mmol/L (ref 0–100)

## 2013-01-07 LAB — MAGNESIUM
Magnesium: 2.4 mg/dL (ref 1.5–2.5)
Magnesium: 2.5 mg/dL (ref 1.5–2.5)

## 2013-01-07 MED ORDER — WARFARIN SODIUM 2.5 MG PO TABS
2.5000 mg | ORAL_TABLET | Freq: Every day | ORAL | Status: DC
  Administered 2013-01-07 – 2013-01-08 (×2): 2.5 mg via ORAL
  Filled 2013-01-07 (×3): qty 1

## 2013-01-07 MED ORDER — WARFARIN - PHYSICIAN DOSING INPATIENT: Freq: Every day | Status: DC

## 2013-01-07 MED ORDER — MORPHINE SULFATE 2 MG/ML IJ SOLN: 2.0000 mg | INTRAMUSCULAR | Status: DC | PRN

## 2013-01-07 MED ORDER — WARFARIN VIDEO: Freq: Once | Status: DC

## 2013-01-07 MED ORDER — KETOROLAC TROMETHAMINE 15 MG/ML IJ SOLN
15.0000 mg | Freq: Four times a day (QID) | INTRAMUSCULAR | Status: AC
  Administered 2013-01-07 – 2013-01-08 (×3): 15 mg via INTRAVENOUS
  Filled 2013-01-07 (×3): qty 1

## 2013-01-07 MED ORDER — INSULIN ASPART 100 UNIT/ML ~~LOC~~ SOLN: 0.0000 [IU] | SUBCUTANEOUS | Status: DC

## 2013-01-07 MED ORDER — INSULIN ASPART 100 UNIT/ML ~~LOC~~ SOLN
0.0000 [IU] | SUBCUTANEOUS | Status: DC
  Administered 2013-01-07: 2 [IU] via SUBCUTANEOUS

## 2013-01-07 MED ORDER — POTASSIUM CHLORIDE 10 MEQ/50ML IV SOLN: 10.0000 meq | INTRAVENOUS | Status: AC

## 2013-01-07 MED ORDER — POTASSIUM CHLORIDE 10 MEQ/50ML IV SOLN
INTRAVENOUS | Status: AC
  Administered 2013-01-07: 10 meq
  Filled 2013-01-07: qty 50

## 2013-01-07 MED ORDER — COUMADIN BOOK
Freq: Once | Status: AC
  Administered 2013-01-07: 10:00:00
  Filled 2013-01-07: qty 1

## 2013-01-07 MED ORDER — FUROSEMIDE 10 MG/ML IJ SOLN: 20.0000 mg | Freq: Four times a day (QID) | INTRAMUSCULAR | Status: DC

## 2013-01-07 MED ORDER — POTASSIUM CHLORIDE 10 MEQ/50ML IV SOLN
10.0000 meq | INTRAVENOUS | Status: AC
  Administered 2013-01-07 (×3): 10 meq via INTRAVENOUS

## 2013-01-07 MED FILL — Magnesium Sulfate Inj 50%: INTRAMUSCULAR | Qty: 10 | Status: AC

## 2013-01-07 MED FILL — Lidocaine HCl IV Inj 20 MG/ML: INTRAVENOUS | Qty: 5 | Status: AC

## 2013-01-07 MED FILL — Albumin, Human Inj 5%: INTRAVENOUS | Qty: 250 | Status: AC

## 2013-01-07 MED FILL — Heparin Sodium (Porcine) Inj 1000 Unit/ML: INTRAMUSCULAR | Qty: 30 | Status: AC

## 2013-01-07 MED FILL — Sodium Chloride Irrigation Soln 0.9%: Qty: 3000 | Status: AC

## 2013-01-07 MED FILL — Electrolyte-R (PH 7.4) Solution: INTRAVENOUS | Qty: 4000 | Status: AC

## 2013-01-07 MED FILL — Calcium Chloride Inj 10%: INTRAVENOUS | Qty: 10 | Status: AC

## 2013-01-07 MED FILL — Sodium Bicarbonate IV Soln 8.4%: INTRAVENOUS | Qty: 50 | Status: AC

## 2013-01-07 MED FILL — Sodium Chloride IV Soln 0.9%: INTRAVENOUS | Qty: 1000 | Status: AC

## 2013-01-07 MED FILL — Potassium Chloride Inj 2 mEq/ML: INTRAVENOUS | Qty: 40 | Status: AC

## 2013-01-07 MED FILL — Mannitol IV Soln 20%: INTRAVENOUS | Qty: 500 | Status: AC

## 2013-01-07 NOTE — Op Note (Signed)
NAMEMARCELUS, DUBBERLY NO.:  [id number]  MEDICAL RECORD NO.:  [id number]  LOCATION:  2309                         FACILITY:  [facility]  PHYSICIAN:  Burna Forts, M.D.DATE OF BIRTH:  [birth year]  DATE OF PROCEDURE:  01/06/2013 DATE OF DISCHARGE:                              OPERATIVE REPORT   INDICATIONS FOR PROCEDURE:  Mr. Matsuo is a 67 year old gentleman, a patient of Dr. Tressie Stalker, who presents today for mitral valve repair with minimally invasive technique.  He was brought to the holding area the morning of surgery where under local anesthesia and sedation, pulmonary artery and radial arterial lines are placed.  He is then taken to the OR for routine induction of general anesthesia after which the TEE probe is prepared and passed through oropharyngeally into the stomach, then slightly withdrawn for imaging of the cardiac structures.  PRECARDIOPULMONARY BYPASS TEE EXAMINATION:  Left Ventricle:  The left ventricular chamber is seen initially in the short axis view.  It is a normal left ventricular chamber in size and thickness.  There is good excellent contractile pattern appreciated.  Papillary muscles are well outlined and there are no masses noted within.  Mitral Valve:  The mitral valve apparatus is seen initially in the four- chamber view.  It is apparent that both leaflets are thickened, especially the posterior leaflet itself.  There is a ballooning of both leaflets, anterior and posterior, into and above the level of the annular area.  On close examination, there is abnormality in the anterior leaflet.  There is also abnormalities of the posterior leaflets, especially.  Multiple views are carried out, as well as 3D imaging.  What is determined is that there is a severely prolapsing knuckle of the posterior leaflets associated with a small flail segment likely a chordee, that is prominent well into the left atrium.  On color Doppler examination,  there is an eccentrically guided jet beneath this posterior leaflet that prolapses, across the top of the anterior leaflet and well into the left atrial chamber.  This jet of regurgitant flow layers out along the inner atrial septum and well into the left atrial chamber itself.  Color Doppler examination into the pulmonary vein reveals a blunted flow, indicative of high pressures in the left atrial chamber and decreased inflow.  Multiple views of the mitral valve annular area are obtained in both to 2 chamber and 4 chamber views, as well as 3D views.  Aortic Valve:  This is a 3 cusps aortic valve is entirely normal.  The interatrial septum between right and left atrium is interrogated, it is intact.  No PFOs were demonstrated.  The right atrial chamber is normal.  The right ventricle has good satisfactory contractility.  Tricuspid valve:  This is a thin compliant, mobile, tricuspid valve, only trace regurgitant flow is appreciated with color Doppler.  Minimally invasive technique is used to approach the mitral valve.  The mitral valve itself is repaired followed by the ring annuloplasty.  The patient is rewarmed, de-airing maneuvers were carried out, and the patient is separated from cardiopulmonary bypass with the initial attempt.  DIRECTED POST CARDIOPULMONARY BYPASS TEE EXAMINATION:  The mitral valve is seen  again in the short and/or four-chamber view.  The ring annuloplasty seated well and appropriately.  The leaflets of the anterior and posterior valves of the mitral valve could be seen with ease.  They appeared to coapt below the level of the ring annuloplasty repair.  Color Doppler across this new ring annuloplasty repair shows there is no regurgitant flow now.  Inflow is measured at the great peak of about 4-5 mmHg pressure gradient with a mean of only 2-3.  These are normal.  Multiple views are carried out of the repaired mitral valve and again indicates essentially no  regurgitant flow.  This is considered a satisfactory repair.  Left ventricular chamber views again show good contractile pattern.  No other changes were noted in the rest of the cardiac examination and the patient was returned to the cardiac intensive care unit in stable condition.          ______________________________ Burna Forts, M.D.     JTM/MEDQ  D:  01/06/2013  T:  01/07/2013  Job:  [id number]

## 2013-01-07 NOTE — Progress Notes (Addendum)
   CARDIOTHORACIC SURGERY PROGRESS NOTE   R1 Day Post-Op Procedure(s) (LRB): MINIMALLY INVASIVE MITRAL VALVE REPAIR (MVR) (Right) INTRAOPERATIVE TRANSESOPHAGEAL ECHOCARDIOGRAM (N/A)  Subjective: Looks good.  Denies any pain.  No nausea.  Feels well.  Objective: Vital signs: BP Readings from Last 1 Encounters:  01/07/13 90/58   Pulse Readings from Last 1 Encounters:  01/07/13 79   Resp Readings from Last 1 Encounters:  01/07/13 21   Temp Readings from Last 1 Encounters:  01/07/13 99.5 F (37.5 C)     Hemodynamics: PAP: (25-36)/(12-24) 25/13 mmHg CO:  [4 L/min-6.6 L/min] 6.1 L/min CI:  [2 L/min/m2-3.2 L/min/m2] 3.1 L/min/m2  Physical Exam:  Rhythm:   Junctional - sinus brady - with AAI pacing the patient feels his diaphragm getting paced  Breath sounds: clear  Heart sounds:  RRR w/out murmur  Incisions:  Dressings dry  Abdomen:  soft  Extremities:  warm   Intake/Output from previous day: 04/09 0701 - 04/10 0700 In: 5946.3 [P.O.:180; I.V.:3856.3; Blood:450; NG/GT:60; IV Piggyback:1400] Out: 4330 [Urine:2850; Emesis/NG output:150; Blood:800; Chest Tube:530] Intake/Output this shift: Total I/O In: 39.4 [I.V.:39.4] Out: 50 [Urine:50]  Lab Results:  Recent Labs  01/06/13 2200 01/06/13 2220 01/07/13 0320  WBC 10.2  --  10.2  HGB 11.4* 9.9* 11.1*  HCT 30.7* 29.0* 30.5*  PLT 131*  --  133*   BMET:  Recent Labs  01/04/13 1514  01/06/13 2220 01/07/13 0320  NA 143  < > 140 136  K 4.0  < > 3.7 3.6  CL 106  --  111 106  CO2 26  --   --  21  GLUCOSE 97  < > 128* 112*  BUN 19  --  11 9  CREATININE 1.09  < > 0.70 0.90  CALCIUM 9.4  --   --  7.8*  < > = values in this interval not displayed.  CBG (last 3)   Recent Labs  01/07/13 0510 01/07/13 0621 01/07/13 0745  GLUCAP 106* 110* 105*   ABG    Component Value Date/Time   PHART 7.309* 01/06/2013 2220   HCO3 15.2* 01/06/2013 2220   TCO2 16 01/06/2013 2220   TCO2 17 01/06/2013 2220   ACIDBASEDEF 10.0*  01/06/2013 2220   O2SAT 90.0 01/06/2013 2220   CXR: Remarkably clear, mild bibasilar atelectasis R>L  Assessment/Plan: S/P Procedure(s) (LRB): MINIMALLY INVASIVE MITRAL VALVE REPAIR (MVR) (Right) INTRAOPERATIVE TRANSESOPHAGEAL ECHOCARDIOGRAM (N/A)  Doing very well POD1 Stable hemodynamics although vasodilated and still on low dose neo and dopamine for BP Expected post op acute blood loss anemia, mild, stable Expected post op volume excess, mild, diuresing Expected post op atelectasis, mild Expected post op sinus brady - accelerated junctional rhythm   Mobilize  Leave pacer off for now  Hold beta blocker and amiodarone for now  Wean drips  Start coumadin  Diuresis once BP stable off neo   Massai Hankerson H 01/07/2013 8:22 AM

## 2013-01-07 NOTE — Progress Notes (Signed)
Progressing well post OP  MVRepair  Will follow.

## 2013-01-07 NOTE — Anesthesia Postprocedure Evaluation (Signed)
  Anesthesia Post-op Note  Patient: James [name]  Procedure(s) Performed: Procedure(s): MINIMALLY INVASIVE MITRAL VALVE REPAIR (MVR) (Right) INTRAOPERATIVE TRANSESOPHAGEAL ECHOCARDIOGRAM (N/A)  Patient Location: SICU  Anesthesia Type:General  Level of Consciousness: awake, alert , oriented and patient cooperative  Airway and Oxygen Therapy: Patient Spontanous Breathing and Patient connected to nasal cannula oxygen  Post-op Pain: mild  Post-op Assessment: Post-op Vital signs reviewed, Patient's Cardiovascular Status Stable, Respiratory Function Stable, Patent Airway, No signs of Nausea or vomiting, Adequate PO intake and Pain level controlled  Post-op Vital Signs: Reviewed and stable  Complications: No apparent anesthesia complications

## 2013-01-07 NOTE — Progress Notes (Signed)
Patient ID: James [name], male   DOB: [birth year], 67 y.o.   MRN: [id number]  SICU Evening Rounds:    Hemodynamically stable on dop 3  Urine output ok CBC    Component Value Date/Time   WBC 10.1 01/07/2013 1645   RBC 2.92* 01/07/2013 1645   HGB 9.2* 01/07/2013 1649   HCT 27.0* 01/07/2013 1649   PLT 105* 01/07/2013 1645   MCV 93.2 01/07/2013 1645   MCH 33.6 01/07/2013 1645   MCHC 36.0 01/07/2013 1645   RDW 12.8 01/07/2013 1645    BMET    Component Value Date/Time   NA 138 01/07/2013 1649   K 4.3 01/07/2013 1649   CL 104 01/07/2013 1649   CO2 21 01/07/2013 0320   GLUCOSE 85 01/07/2013 1649   BUN 8 01/07/2013 1649   CREATININE 0.90 01/07/2013 1649   CALCIUM 7.8* 01/07/2013 0320   GFRNONAA 86* 01/07/2013 1645   GFRAA >90 01/07/2013 1645     No problems tonight.

## 2013-01-08 ENCOUNTER — Inpatient Hospital Stay (HOSPITAL_COMMUNITY): Payer: Medicare Other

## 2013-01-08 LAB — BASIC METABOLIC PANEL
Calcium: 8.1 mg/dL — ABNORMAL LOW (ref 8.4–10.5)
GFR calc Af Amer: >90 mL/min (ref >90)
GFR calc non Af Amer: 87 mL/min — ABNORMAL LOW (ref >90)
Potassium: 3.8 mEq/L (ref 3.5–5.1)
Sodium: 137 mEq/L (ref 135–145)

## 2013-01-08 LAB — GLUCOSE, CAPILLARY
Glucose-Capillary: 101 mg/dL — ABNORMAL HIGH (ref 70–99)
Glucose-Capillary: 99 mg/dL (ref 70–99)

## 2013-01-08 LAB — PROTIME-INR
INR: 1.13 (ref 0.00–1.49)
Prothrombin Time: 14.3 seconds (ref 11.6–15.2)

## 2013-01-08 LAB — CBC
Hemoglobin: 9.3 g/dL — ABNORMAL LOW (ref 13.0–17.0)
MCH: 33.8 pg (ref 26.0–34.0)
MCHC: 35.9 g/dL (ref 30.0–36.0)
Platelets: 93 10*3/uL — ABNORMAL LOW (ref 150–400)
RBC: 2.75 MIL/uL — ABNORMAL LOW (ref 4.22–5.81)

## 2013-01-08 MED ORDER — ASPIRIN EC 81 MG PO TBEC
81.0000 mg | DELAYED_RELEASE_TABLET | Freq: Every day | ORAL | Status: DC
  Administered 2013-01-08 – 2013-01-10 (×3): 81 mg via ORAL
  Filled 2013-01-08 (×3): qty 1

## 2013-01-08 MED ORDER — MOVING RIGHT ALONG BOOK
Freq: Once | Status: AC
  Administered 2013-01-08: 09:00:00
  Filled 2013-01-08: qty 1

## 2013-01-08 MED ORDER — SODIUM CHLORIDE 0.9 % IV SOLN: 250.0000 mL | INTRAVENOUS | Status: DC | PRN

## 2013-01-08 MED ORDER — FUROSEMIDE 40 MG PO TABS
40.0000 mg | ORAL_TABLET | Freq: Two times a day (BID) | ORAL | Status: DC
  Administered 2013-01-08 – 2013-01-10 (×5): 40 mg via ORAL
  Filled 2013-01-08 (×7): qty 1

## 2013-01-08 MED ORDER — POTASSIUM CHLORIDE CRYS ER 20 MEQ PO TBCR
20.0000 meq | EXTENDED_RELEASE_TABLET | Freq: Two times a day (BID) | ORAL | Status: DC
  Administered 2013-01-09 – 2013-01-10 (×3): 20 meq via ORAL
  Filled 2013-01-08 (×5): qty 1

## 2013-01-08 MED ORDER — SODIUM CHLORIDE 0.9 % IJ SOLN
3.0000 mL | Freq: Two times a day (BID) | INTRAMUSCULAR | Status: DC
  Administered 2013-01-08 – 2013-01-09 (×2): 3 mL via INTRAVENOUS

## 2013-01-08 MED ORDER — SODIUM CHLORIDE 0.9 % IJ SOLN: 3.0000 mL | INTRAMUSCULAR | Status: DC | PRN

## 2013-01-08 MED ORDER — TRAMADOL HCL 50 MG PO TABS
50.0000 mg | ORAL_TABLET | ORAL | Status: DC | PRN
  Administered 2013-01-09 (×2): 50 mg via ORAL
  Filled 2013-01-08 (×2): qty 1

## 2013-01-08 MED ORDER — AMIODARONE HCL 200 MG PO TABS
200.0000 mg | ORAL_TABLET | Freq: Every day | ORAL | Status: DC
  Filled 2013-01-08: qty 1

## 2013-01-08 MED ORDER — POTASSIUM CHLORIDE 10 MEQ/50ML IV SOLN
10.0000 meq | INTRAVENOUS | Status: AC
  Administered 2013-01-08 (×3): 10 meq via INTRAVENOUS
  Filled 2013-01-08: qty 150

## 2013-01-08 NOTE — Progress Notes (Signed)
   CARDIOTHORACIC SURGERY PROGRESS NOTE   R2 Days Post-Op Procedure(s) (LRB): MINIMALLY INVASIVE MITRAL VALVE REPAIR (MVR) (Right) INTRAOPERATIVE TRANSESOPHAGEAL ECHOCARDIOGRAM (N/A)  Subjective: Looks very good.  No pain.  No SOB.    Objective: Vital signs: BP Readings from Last 1 Encounters:  01/08/13 109/58   Pulse Readings from Last 1 Encounters:  01/08/13 52   Resp Readings from Last 1 Encounters:  01/08/13 21   Temp Readings from Last 1 Encounters:  01/08/13 97.9 F (36.6 C) Oral    Hemodynamics: PAP: (29-36)/(12-13) 36/13 mmHg  Physical Exam:  Rhythm:   Sinus 60  Breath sounds: clear  Heart sounds:  RRR w/out murmur  Incisions:  Clean and dry  Abdomen:  soft  Extremities:  warm   Intake/Output from previous day: 04/10 0701 - 04/11 0700 In: 1552.5 [P.O.:780; I.V.:522.5; IV Piggyback:250] Out: 2065 [Urine:1665; Chest Tube:400] Intake/Output this shift: Total I/O In: 29.6 [I.V.:29.6] Out: -   Lab Results:  Recent Labs  01/07/13 1645 01/07/13 1649 01/08/13 0445  WBC 10.1  --  7.6  HGB 9.8* 9.2* 9.3*  HCT 27.2* 27.0* 25.9*  PLT 105*  --  93*   BMET:  Recent Labs  01/07/13 0320  01/07/13 1649 01/08/13 0445  NA 136  --  138 137  K 3.6  --  4.3 3.8  CL 106  --  104 107  CO2 21  --   --  24  GLUCOSE 112*  --  85 109*  BUN 9  --  8 9  CREATININE 0.90  < > 0.90 0.89  CALCIUM 7.8*  --   --  8.1*  < > = values in this interval not displayed.  CBG (last 3)   Recent Labs  01/07/13 2100 01/08/13 0038 01/08/13 0804  GLUCAP 98 101* 99   ABG    Component Value Date/Time   PHART 7.309* 01/06/2013 2220   HCO3 15.2* 01/06/2013 2220   TCO2 22 01/07/2013 1649   ACIDBASEDEF 10.0* 01/06/2013 2220   O2SAT 90.0 01/06/2013 2220   CXR: *RADIOLOGY REPORT*  Clinical Data: Mitral valve repair  PORTABLE CHEST - 1 VIEW  Comparison: 01/07/2013  Findings: Swan-Ganz catheter has been removed with the left  internal jugular introducer left in place. Right  chest tubes are  stable. Mitral valve hardware stable. Percutaneous pacemaker  wires unchanged. The low volumes with bibasilar atelectasis  improved. Tiny left pleural effusion. No pneumothorax. Pulmonary  vascularity within normal limits. Cardiomegaly.  IMPRESSION:  Improved basilar atelectasis. No pneumothorax.  Original Report Authenticated By: Jolaine Click, M.D.   Assessment/Plan: S/P Procedure(s) (LRB): MINIMALLY INVASIVE MITRAL VALVE REPAIR (MVR) (Right) INTRAOPERATIVE TRANSESOPHAGEAL ECHOCARDIOGRAM (N/A)  Doing very well POD2 Expected post op acute blood loss anemia, mild, stable Expected post op volume excess, mild, diuresing Post op thrombocytopenia, mild   Mobilize  Diuresis  Leave chest tubes 1 more day  Prophylactic amiodarone  Coumadin  Anticipate d/c home Sunday or Monday  OWEN,CLARENCE H 01/08/2013 9:02 AM

## 2013-01-08 NOTE — Progress Notes (Signed)
Patient has ambulated in 2300 earlier today and patient ambulated to 2017 from 2300 this afternoon will monitor patient . Devaughn Savant, Randall An RN

## 2013-01-09 LAB — CBC
HCT: 25.2 % — ABNORMAL LOW (ref 39.0–52.0)
Hemoglobin: 9 g/dL — ABNORMAL LOW (ref 13.0–17.0)
MCV: 95.5 fL (ref 78.0–100.0)
RDW: 13 % (ref 11.5–15.5)
WBC: 7.2 10*3/uL (ref 4.0–10.5)

## 2013-01-09 LAB — BASIC METABOLIC PANEL
BUN: 13 mg/dL (ref 6–23)
CO2: 26 mEq/L (ref 19–32)
Chloride: 105 mEq/L (ref 96–112)
Creatinine, Ser: 0.98 mg/dL (ref 0.50–1.35)
Glucose, Bld: 107 mg/dL — ABNORMAL HIGH (ref 70–99)
Potassium: 4 mEq/L (ref 3.5–5.1)

## 2013-01-09 MED ORDER — WARFARIN SODIUM 5 MG PO TABS
5.0000 mg | ORAL_TABLET | Freq: Every day | ORAL | Status: DC
  Administered 2013-01-09: 5 mg via ORAL
  Filled 2013-01-09 (×2): qty 1

## 2013-01-09 NOTE — Progress Notes (Addendum)
3 Days Post-Op Procedure(s) (LRB): MINIMALLY INVASIVE MITRAL VALVE REPAIR (MVR) (Right) INTRAOPERATIVE TRANSESOPHAGEAL ECHOCARDIOGRAM (N/A) Subjective:  James [name] is without complaints this morning.  He states that he feels great.  He is ambulating without difficulty +BM  Objective: Vital signs in last 24 hours: Temp:  [98.5 F (36.9 C)-100.3 F (37.9 C)] 100.3 F (37.9 C) (04/12 0415) Pulse Rate:  [47-59] 49 (04/12 0415) Cardiac Rhythm:  [-] Sinus bradycardia;Junctional rhythm (04/11 2030) Resp:  [18-27] 19 (04/12 0415) BP: (104-124)/(51-61) 116/57 mmHg (04/12 0415) SpO2:  [94 %-100 %] 97 % (04/12 0415) Weight:  [195 lb 12.8 oz (88.814 kg)] 195 lb 12.8 oz (88.814 kg) (04/12 0415)  Intake/Output from previous day: 04/11 0701 - 04/12 0700 In: 104.4 [I.V.:54.4; IV Piggyback:50] Out: 2400 [Urine:1850; Chest Tube:550]  General appearance: alert, cooperative and no distress Heart: regular rate and rhythm Lungs: clear to auscultation bilaterally Abdomen: soft, non-tender; bowel sounds normal; no masses,  no organomegaly Extremities: extremities normal, atraumatic, no cyanosis or edema Wound: clean and dry  Lab Results:  Recent Labs  01/08/13 0445 01/09/13 0600  WBC 7.6 7.2  HGB 9.3* 9.0*  HCT 25.9* 25.2*  PLT 93* 115*   BMET:  Recent Labs  01/08/13 0445 01/09/13 0600  NA 137 139  K 3.8 4.0  CL 107 105  CO2 24 26  GLUCOSE 109* 107*  BUN 9 13  CREATININE 0.89 0.98  CALCIUM 8.1* 8.1*    PT/INR:  Recent Labs  01/09/13 0600  LABPROT 13.8  INR 1.07   ABG    Component Value Date/Time   PHART 7.309* 01/06/2013 2220   HCO3 15.2* 01/06/2013 2220   TCO2 22 01/07/2013 1649   ACIDBASEDEF 10.0* 01/06/2013 2220   O2SAT 90.0 01/06/2013 2220   CBG (last 3)   Recent Labs  01/08/13 0038 01/08/13 0435 01/08/13 0804  GLUCAP 101* 111* 99    Assessment/Plan: S/P Procedure(s) (LRB): MINIMALLY INVASIVE MITRAL VALVE REPAIR (MVR) (Right) INTRAOPERATIVE TRANSESOPHAGEAL  ECHOCARDIOGRAM (N/A)  1.  CV- NSR, Bradycardia rates as low as 49, currently on Prophylactic Amiodarone may need to discontinue 2. Pulm- no acute issues, off oxygen, good use of IS 3. INR 1.07- will give 5mg  tonight 4. Renal- creatinine WNL, lytes okay, mildly volume overloaded will continue Lasix 5. Chest tubes- 550 cc output last 24 hrs, 330 dumped overnight- will leave in place one more day 6. Dispo-patient doing very well, will discuss discontinuation of Amiodarone with staff, likely d/c in next 24-48 hours   LOS: 3 days    James [name], James [name] 01/09/2013  Patient seen and examined. Agree with above. Will dc amiodarone in light of bradycardia- he has not had any tachyarrhythmias

## 2013-01-09 NOTE — Progress Notes (Signed)
Pt ambulated 600 ft in hallway with RN. Pt tolerated well using wheelchair for minimal support, no assist from RN. Pt has no c/o of SOB, back in bed resting, will continue to monitor.

## 2013-01-09 NOTE — Progress Notes (Signed)
[id number] Cardiac Rehab Pt has ambulated in hall twice today denies any problems. Completed discharge education with pt and wife. Pt agrees to Outpt. CRP in [place], will send referral. Melina Copa RN

## 2013-01-09 NOTE — Progress Notes (Signed)
Pt ambulated 600 ft in hallway with wife and no 02. Pt tolerated well, back in bed resting, will continue to monitor.

## 2013-01-09 NOTE — Progress Notes (Signed)
Progressing well. Sinus bradycardia noted. No longer on amiodarone. This was for prophylaxis only.

## 2013-01-10 ENCOUNTER — Inpatient Hospital Stay (HOSPITAL_COMMUNITY): Payer: Medicare Other

## 2013-01-10 LAB — PROTIME-INR: INR: 0.98 (ref 0.00–1.49)

## 2013-01-10 MED ORDER — OXYCODONE HCL 5 MG PO TABS: 5.0000 mg | ORAL_TABLET | ORAL | Status: DC | PRN

## 2013-01-10 MED ORDER — WARFARIN SODIUM 5 MG PO TABS: 5.0000 mg | ORAL_TABLET | Freq: Every day | ORAL | Status: DC

## 2013-01-10 MED ORDER — POTASSIUM CHLORIDE CRYS ER 20 MEQ PO TBCR: 20.0000 meq | EXTENDED_RELEASE_TABLET | Freq: Every day | ORAL | Status: DC

## 2013-01-10 MED ORDER — FUROSEMIDE 40 MG PO TABS: 40.0000 mg | ORAL_TABLET | Freq: Every day | ORAL | Status: DC

## 2013-01-10 MED ORDER — WARFARIN SODIUM 7.5 MG PO TABS
7.5000 mg | ORAL_TABLET | Freq: Every day | ORAL | Status: DC
  Filled 2013-01-10: qty 1

## 2013-01-10 NOTE — Progress Notes (Signed)
Pt educated and informed of DC information. Pt verbalizes understanding of f/u appts and medications. Dsg changed on chest tube site, wife educated on how to cover site if needed for comfort. IV removed, pt ready for DC with wife and daughter.

## 2013-01-10 NOTE — Discharge Summary (Signed)
Physician Discharge Summary  Patient ID: James [name] MRN: [id number] DOB/AGE: 03/21/1946 67 y.o.  Admit date: 01/06/2013 Discharge date: 01/10/2013  Admission Diagnoses:  Patient Active Problem List  Diagnosis  . Mitral valve disorders  . Severe mitral regurgitation  . Hypertension  . GERD (gastroesophageal reflux disease)  . Trannie Bardales esophagus  . Hyperlipidemia  . MR (mitral regurgitation)   Discharge Diagnoses:   Patient Active Problem List  Diagnosis  . Mitral valve disorders  . Severe mitral regurgitation  . Hypertension  . GERD (gastroesophageal reflux disease)  . Rogena Deupree esophagus  . Hyperlipidemia  . MR (mitral regurgitation)  . S/P mitral valve repair   Discharged Condition: good  History of Present Illness:   [name] is a 67 yo white male with known history of a heart murmur.  He states he was initially noticed to have a  Heart murmur approximately 35 years ago.  He was later diagnosed with Mitral Regurgitation due to a prolapsed valve leaflet.  He has since been followed routinely in [place] with serial Echocardiograms.  Approximately 1 year ago he was informed that his Mitral Regurgitation had gotten worse and his heart had also enlarged.  He has since moved to [facility] and has been followed by Dr. Anne Fu.  Most recent Echocardiogram was performed in February of this year which showed Mitral Valve Prolapse with severe regurgitation.  He underwent follow up TEE which confirmed the disease of the Mitral Valve.  It was felt the patient would benefit from intervention to his Mitral Valve.  He was subsequently referred to Dr. Cornelius Moras for possible Mitral Valve Intervention.  He was seen on 12/04/2012 at which time the patient denied symptoms of dyspnea, orthopnea, LE edema or decline in his level of physical activity.  After review of the patient's imaging studies it was felt there was a very high likely hood that Mitral Valve Repair good be accomplished with good success.   The patient was instructed he would need cardiac catheterization prior to proceeding with surgery.  This was performed and showed no evidence of obstructive Coronary Artery Disease.  He was again seen by Dr. Cornelius Moras on 12/21/2012 at which time it was felt the patient would proceed with surgery.  The risks and the benefits of the procedure were explained to the patient and he was agreeable to proceed.  Surgery was scheduled to be through a Minimally Invasive approach for January 06, 2013.  Hospital Course:   Mr. Bolotin presented to [facility] on 01/06/2013.  He was taken to the operating room and underwent Minimally Invasive Complex Mitral Valve Repair consisting of Valve annuloplasty with triangular resection of the flail posterior leaflet segment and a 34 mm Sorin Memo 3D Ring.  He also had artifical Gore-tex Neochord placement x4.  He tolerated the procedure well and was taken to the SICU in stable condition.  The patient was extubated the evening of surgery.  During his stay in the ICU the patient was weaned of Neo and Dopamine as tolerated.  He experienced Bradycardia and an accelerated junctional rhythm.  He was placed on Amiodarone as prophylaxis for Atrial Fibrillation.  He was also started on Coumadin.  Once the patient was medically stable he was transferred to the floor in stable condition.  The patient continued to do well.  Once his chest tube output decreased his chest tubes were removed without difficulty.  Patient continues to have Bradycardia with HR going as low at 49.  His Amiodarone was discontinued and  he will not be started on a Beta Blocker due to this.  His pacing wires have been removed.  He remains in Sinus Bradycardia with rate improved to the upper 50s to low 60s.  He has been slow to respond to Coumadin, however being he has had no arrythmia and his valve was repaired we will continue INR titration on an outpatient basis.  He will be discharged on 5 mg daily with instructions to have  his level checked at Dr. Anne Fu office on 01/12/2013.  He is medically stable at this time and will be discharged home today.  He will follow up with Dr. Cornelius Moras in 2 weeks with a CXR prior to his appointment.  Our office will contact the patient with his appointment date and time.  He will also need to follow up with Dr. Anne Fu office in 2-4 weeks.    Significant Diagnostic Studies:   Left Ventricle: The left  ventricular chamber is seen initially in the short axis view. It is a  normal left ventricular chamber in size and thickness. There is good  excellent contractile pattern appreciated. Papillary muscles are well  outlined and there are no masses noted within.  Mitral Valve: The mitral valve apparatus is seen initially in the four-  chamber view. It is apparent that both leaflets are thickened,  especially the posterior leaflet itself. There is a ballooning of both  leaflets, anterior and posterior, into and above the level of the  annular area. On close examination, there is abnormality in the  anterior leaflet. There is also abnormalities of the posterior  leaflets, especially. Multiple views are carried out, as well as 3D  imaging. 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 On color  Doppler examination, there is an eccentrically guided jet beneath this  posterior leaflet that prolapses, across the top of the anterior leaflet  and well into the left atrial chamber. This jet of regurgitant flow  layers out along the inner atrial septum and well into the left atrial  chamber itself. Color Doppler examination into the pulmonary vein  reveals a blunted flow, indicative of high pressures in the left atrial  chamber and decreased inflow.  Multiple views of the mitral valve annular area are obtained in both to  2 chamber and 4 chamber views,  as well as 3D views.  Aortic Valve: This is a 3 cusps aortic valve is entirely normal.  The interatrial septum between right and left atrium is interrogated, it  is intact. No PFOs were demonstrated. The right atrial chamber is  normal. The right ventricle has good satisfactory contractility.  Tricuspid valve: This is a thin compliant, mobile, tricuspid valve,  only trace regurgitant flow is appreciated with color Doppler.  Minimally invasive technique is used to approach the mitral valve. The  mitral valve itself is repaired followed by the ring annuloplasty. The  patient is rewarmed, de-airing maneuvers were carried out, and the  patient is separated from cardiopulmonary bypass with the initial  attempt.  DIRECTED POST CARDIOPULMONARY BYPASS TEE EXAMINATION: The mitral valve  is seen again in the short and/or four-chamber view. The ring  annuloplasty seated well and appropriately. The leaflets of the  anterior and posterior valves of the mitral valve could be seen with  ease. They appeared to coapt below the level of the ring annuloplasty  repair. Color Doppler across this new ring annuloplasty repair shows  there is no regurgitant flow now. Inflow is measured at the great peak  of about 4-5 mmHg pressure gradient with a mean of only 2-3. These are  normal. Multiple views are carried out of the repaired mitral valve and  again indicates essentially no regurgitant flow. This is considered a  satisfactory repair.  Left ventricular chamber views again show good contractile pattern. No  other changes were noted in the rest of the cardiac examination and the  patient was returned to the cardiac intensive care unit in stable  condition.  Treatments: surgery:   Minimally-Invasive Mitral Valve Repair Complex valvuloplasty including triangular resection of flail segment of posterior leaflet  Artificial Gore-tex neocord placement x4  Sorin Memo 3D Ring Annuloplasty (size 34mm, catalog #  [id number], serial # [id number])    Disposition: 01-Home or Self Care  The patient has been discharged on:   1.Beta Blocker:  Yes [   ]                              No   [ x  ]                              If No, reason:Bradycardia  2.Ace Inhibitor/ARB: Yes [   ]                                     No  [ x   ]                                     If No, reason:No CAD, labile blood pressure  3.Statin:   Yes [ x  ]                  No  [   ]                  If No, reason:  4.Ecasa:  Yes  [ x  ]                  No   [   ]                  If No, reason:     Discharge Orders   Future Orders Complete By Expires     Amb Referral to Cardiac Rehabilitation  As directed         Medication List    STOP taking these medications       amiodarone 200 MG tablet  Commonly known as:  PACERONE     amLODipine 10 MG tablet  Commonly known as:  NORVASC     benazepril 20 MG tablet  Commonly known as:  LOTENSIN     hydrochlorothiazide 12.5 MG capsule  Commonly known as:  MICROZIDE      TAKE these medications       aspirin EC 81 MG tablet  Take 81 mg by mouth daily.     cetirizine 10 MG tablet  Commonly known as:  ZYRTEC  Take 10 mg by mouth daily as needed for allergies.     Co Q-10 300 MG Caps  Take  1 capsule by mouth daily.     fish oil-omega-3 fatty acids 1000 MG capsule  Take 1 g by mouth daily.     furosemide 40 MG tablet  Commonly known as:  LASIX  Take 1 tablet (40 mg total) by mouth daily. For 5 Days     multivitamin with minerals Tabs  Take 1 tablet by mouth daily.     omeprazole 20 MG tablet  Commonly known as:  PRILOSEC OTC  Take 20 mg by mouth daily.     oxyCODONE 5 MG immediate release tablet  Commonly known as:  Oxy IR/ROXICODONE  Take 1-2 tablets (5-10 mg total) by mouth every 4 (four) hours as needed.     potassium chloride SA 20 MEQ tablet  Commonly known as:  K-DUR,KLOR-CON  Take 1 tablet (20 mEq total) by mouth daily. For 5 Days     sildenafil  50 MG tablet  Commonly known as:  VIAGRA  Take 50 mg by mouth as needed for erectile dysfunction.     simvastatin 20 MG tablet  Commonly known as:  ZOCOR  Take 20 mg by mouth every evening.     warfarin 5 MG tablet  Commonly known as:  COUMADIN  Take 1 tablet (5 mg total) by mouth daily at 6 PM.           Follow-up Information   Follow up with Purcell Nails, MD In 2 weeks. (Office will mail appointment date to you)    Contact information:   [address] [place] [address] [phone]       Follow up with [facility] In 2 weeks. (Please get CXR at least 1 hour prior to your appointment with Dr. Cornelius Moras)    Contact information:   [place]       Follow up with PT/INR. (Please contact Dr. Anne Fu office on Monday 01/11/2013 to set up PT/INR check for 01/12/2013 (Tuesday))       Schedule an appointment as soon as possible for a visit with Donato Schultz, MD. (Please set up 2-4 week follow up visit)    Contact information:   [address] [place] [address] [phone]       Signed: [name] 01/10/2013, 10:01 AM

## 2013-01-10 NOTE — Progress Notes (Signed)
EPW, Y Chest tube, and on-q pumped D/C'd. Pt tolerated well, no ectopy noted, wires, tubes, and drain intact on removal. Pt in bed resting and v/s per protocol. VSS, will continue to monitor.

## 2013-01-10 NOTE — Progress Notes (Addendum)
4 Days Post-Op Procedure(s) (LRB): MINIMALLY INVASIVE MITRAL VALVE REPAIR (MVR) (Right) INTRAOPERATIVE TRANSESOPHAGEAL ECHOCARDIOGRAM (N/A) Subjective:  James [name] is without complaints this morning.  He states he feels great and would like to be discharged today Objective: Vital signs in last 24 hours: Temp:  [97.3 F (36.3 C)-99 F (37.2 C)] 99 F (37.2 C) (04/13 0447) Pulse Rate:  [55-60] 55 (04/13 0447) Cardiac Rhythm:  [-] Sinus bradycardia;Junctional rhythm (04/13 0805) Resp:  [18-20] 18 (04/13 0447) BP: (119-157)/(71-76) 119/71 mmHg (04/13 0447) SpO2:  [97 %-98 %] 97 % (04/13 0447) Weight:  [188 lb 11.2 oz (85.594 kg)] 188 lb 11.2 oz (85.594 kg) (04/13 0447)  Intake/Output from previous day: 04/12 0701 - 04/13 0700 In: -  Out: 1910 [Urine:1650; Chest Tube:260]  General appearance: alert, cooperative and no distress Neurologic: intact Heart: regular rate and rhythm Lungs: clear to auscultation bilaterally Abdomen: soft, non-tender; bowel sounds normal; no masses,  no organomegaly Extremities: extremities normal, atraumatic, no cyanosis or edema Wound: clean and dry  Lab Results:  Recent Labs  01/08/13 0445 01/09/13 0600  WBC 7.6 7.2  HGB 9.3* 9.0*  HCT 25.9* 25.2*  PLT 93* 115*   BMET:  Recent Labs  01/08/13 0445 01/09/13 0600  NA 137 139  K 3.8 4.0  CL 107 105  CO2 24 26  GLUCOSE 109* 107*  BUN 9 13  CREATININE 0.89 0.98  CALCIUM 8.1* 8.1*    PT/INR:  Recent Labs  01/10/13 0500  LABPROT 12.9  INR 0.98   ABG    Component Value Date/Time   PHART 7.309* 01/06/2013 2220   HCO3 15.2* 01/06/2013 2220   TCO2 22 01/07/2013 1649   ACIDBASEDEF 10.0* 01/06/2013 2220   O2SAT 90.0 01/06/2013 2220   CBG (last 3)   Recent Labs  01/08/13 0038 01/08/13 0435 01/08/13 0804  GLUCAP 101* 111* 99    Assessment/Plan: S/P Procedure(s) (LRB): MINIMALLY INVASIVE MITRAL VALVE REPAIR (MVR) (Right) INTRAOPERATIVE TRANSESOPHAGEAL ECHOCARDIOGRAM (N/A)  1. CV-  Sinus Bradycardia- improved after discontinuation of Amiodarone, continue to hold Beta Blocker 2. Pulm- no acute issues, continue IS 3. INR 0.98- coumadin increased to 5mg  daily yesterday, will increase to 7.5 tonight 4. Chest tubes- 260 cc output yesterday will d/c 5. D/C EPW 6. D/C Pain Ball 7. Dispo- patient doing great, will d/c tubes, wires, pain ball,- will plan to d/c patient today with INR follow up with Dr. Anne Fu office      LOS: 4 days    [name] 01/10/2013  [name] is doing well. Will dc CT and pacing wires this Am His INR has not bumped yet, but we wouldn't do anything different with him in the hospital than we would at home. Will dc on 5 mg daily and recheck INR on Monday

## 2013-01-21 ENCOUNTER — Other Ambulatory Visit: Payer: Self-pay | Admitting: *Deleted

## 2013-01-21 DIAGNOSIS — I059 Rheumatic mitral valve disease, unspecified: Secondary | ICD-10-CM

## 2013-01-25 ENCOUNTER — Ambulatory Visit
Admission: RE | Admit: 2013-01-25 | Discharge: 2013-01-25 | Disposition: A | Discharge status: Home / Self Care | Payer: Medicare Other | Source: Ambulatory Visit | Attending: Thoracic Surgery (Cardiothoracic Vascular Surgery) | Admitting: Thoracic Surgery (Cardiothoracic Vascular Surgery)

## 2013-01-25 ENCOUNTER — Encounter: Payer: Self-pay | Admitting: Thoracic Surgery (Cardiothoracic Vascular Surgery)

## 2013-01-25 ENCOUNTER — Ambulatory Visit (INDEPENDENT_AMBULATORY_CARE_PROVIDER_SITE_OTHER): Payer: Self-pay | Admitting: Thoracic Surgery (Cardiothoracic Vascular Surgery)

## 2013-01-25 VITALS — BP 117/75 | HR 94 | Resp 20 | Ht 71.0 in | Wt 188.0 lb

## 2013-01-25 DIAGNOSIS — I340 Nonrheumatic mitral (valve) insufficiency: Secondary | ICD-10-CM

## 2013-01-25 DIAGNOSIS — Z9889 Other specified postprocedural states: Secondary | ICD-10-CM

## 2013-01-25 DIAGNOSIS — I059 Rheumatic mitral valve disease, unspecified: Secondary | ICD-10-CM

## 2013-01-25 NOTE — Progress Notes (Signed)
[address]            [name] [address]          [phone]     CARDIOTHORACIC SURGERY OFFICE NOTE  Referring Provider is Donato Schultz, MD PCP is Lillia Mountain, MD   HPI:  Patient returns for routine followup status post minimally invasive mitral valve repair on 01/06/2013. His postoperative recovery has been uncomplicated. His hospital discharge she has continued to do exceptionally well. He reports minimal soreness in his chest and he is no longer taking any sort of pain relievers. He has not had any shortness of breath. His appetite is improving. He is sleeping well. He has not had any tachypalpitations or dizzy spells. Overall he has no complaints. He has had his prothrombin time checked on one occasion and his Coumadin dose was adjusted. He is to return today to have his prothrombin time checked again.   Current Outpatient Prescriptions  Medication Sig Dispense Refill  . aspirin EC 81 MG tablet Take 81 mg by mouth daily.      . cetirizine (ZYRTEC) 10 MG tablet Take 10 mg by mouth daily as needed for allergies.       . Coenzyme Q10 (CO Q-10) 300 MG CAPS Take 1 capsule by mouth daily.      . fish oil-omega-3 fatty acids 1000 MG capsule Take 1 g by mouth daily.       . Multiple Vitamin (MULTIVITAMIN WITH MINERALS) TABS Take 1 tablet by mouth daily.      Marland Kitchen omeprazole (PRILOSEC OTC) 20 MG tablet Take 20 mg by mouth daily.      . sildenafil (VIAGRA) 50 MG tablet Take 50 mg by mouth as needed for erectile dysfunction.      . simvastatin (ZOCOR) 20 MG tablet Take 20 mg by mouth every evening.      . warfarin (COUMADIN) 5 MG tablet Take 1 tablet (5 mg total) by mouth daily at 6 PM.  30 tablet  3   No current facility-administered medications for this visit.      Physical Exam:   BP 117/75  Pulse 94  Resp 20  Ht 5\' 11"  (1.803 m)  Wt 188 lb (85.276 kg)  BMI 26.23 kg/m2  SpO2 98%  General:  Well-appearing  Chest:   Clear to  auscultation  CV:   Regular rate and rhythm without murmur  Incisions:  Clean and dry and healing nicely, chest tube sutures are removed  Abdomen:  Soft nontender  Extremities:  Warm and well-perfused  Diagnostic Tests:  *RADIOLOGY REPORT*  Clinical Data: Mitral valve replacement, follow-up.  CHEST - 2 VIEW  Comparison: 01/10/2013.  Findings: Trachea is midline. Heart size stable. Linear opacities  and volume loss are seen at the base of the right hemithorax,  unchanged. Tiny amount of right pleural fluid versus pleural  thickening. Old right rib fractures.  IMPRESSION:  Persistent volume loss at the base of the right hemithorax with a  tiny right pleural effusion or pleural thickening.  Original Report Authenticated By: Leanna Battles, M.D.    Impression:  Patient is doing very well just 3 weeks status post minimally invasive mitral valve repair.  Plan:  I've encouraged patient to continue to gradually increase his physical activity as tolerated with exception of the fact that he should continue to refrain from any heavy lifting or strenuous use of  his arms or shoulders. I think it is safe for him to resume driving an automobile. I've instructed to the patient to stop taking aspirin while he is therapeutic on Coumadin. We have not made any changes in his current medications. At this point he is not taking any of his antihypertensive medications, but his blood pressure is still remarkably normal. It might be reasonable to consider low-dose beta blocker or resuming his ACE inhibitor. The patient is scheduled to see Dr. Anne Fu later this week. We will defer to his judgment. The patient will return in 4 weeks for routine followup.   Salvatore Decent. Cornelius Moras, MD 01/25/2013 2:04 PM

## 2013-01-25 NOTE — Patient Instructions (Signed)
The patient may return to driving an automobile as long as they are no longer requiring oral narcotic pain relievers during the daytime.  It would be wise to start driving only short distances during the daylight and gradually increase from there as they feel comfortable.  The patient may continue to gradually increase their physical activity as tolerated.  They should refrain from any heavy lifting or strenuous use of their arms and shoulders until at least 8 weeks from the time of their surgery, and they should avoid activities that cause increased pain in their chest on the side of their surgical incision.  Otherwise they may continue to increase their activities without any particular limitations.

## 2013-02-02 ENCOUNTER — Other Ambulatory Visit: Payer: Self-pay | Admitting: Cardiology

## 2013-02-04 NOTE — H&P (Signed)
Patient: James [name], James [name] Account Number: [id number] Provider: Aram Beecham A. Emelda Fear, NP  DOB: [birth year] Age: 67 Y Sex: Male Date: 02/02/2013  Phone: [phone]   Address: [address] , [place], [id number]  Pcp: JOHN GRIFFIN          1. MS/EVALUATE AFIB.        HPI:  General:  Mr Longman is a 67 year old male followed by Dr Anne Fu with mitral valve repair by Dr. Cornelius Moras, 01/06/13 secondary to severe mitral valve prolapse and regurgitation with normal ejection fraction, severely dilated left atrium. cardiac cath with No coronary artery disease preoperative. He last saw Dr Anne Fu 01/29/13 and was stable. However yesterday he saw Dr Valentina Lucks with a 2 day hx of feeling weak and dizzy with EKG confirming Atrial fibrillation/flutter with RVR of 130s and was started on Metoprolol 25 mg BID. He returns today after starting medication, he has received 2 dosages of the metoprolol 25 mg. He continues to feel weak and dizzy, EKG in our office also reveals Atrail fibrillaiton with RVR .  Patient denies chest pain, syncope, swelling, nor PND. He does feel the palpitations that started on Saturday, the day after seeing Dr Anne Fu. This was associated with feeling lightheaded, and weak. He states his symptoms are mild andhe was able to go to birthday party this week end without difficulty and walking up stairs in home this am without problems.       ROS:  as noted in HPI, all other systems negative, mild incisional chest soreness. no signs of bleeding no neurological nor GI complaints.       Medical History: Hypertension, Hyperlipidemia, chronic gastroesophageal reflux disease with Barrett's esophagitis, diagnosed 2009, Mitral valve prolapse with regurgitation, Erectile dysfunction, Left inguinal hernia.        Surgical History: minimally invasive mitral valve repair, owen April 2014.        Family History: Father: deceased 46 yrs, chokedMother: deceased 78 yrs, hypertension, CHF, COPDBrother 1:  aliveSister 1: deceased, domestic violence2 son(s) , 1 daughter(s) .        Social History:  General: History of smoking cigarettes: Never smoked. no Smoking. Alcohol: 2-3 a day. Exercise: walk and hike and walks golf course. Occupation: retired, Armed forces logistics/support/administrative officer. Marital Status: Married.  Grandkids in [place]. Now have townhome in [place].       Medications: Taking Prilosec OTC 20 MG Tablet Delayed Release 1 tablet Once a day, Taking Multivitamin . Tablet 1 tablet Once daily, Taking Co Q-10 200 MG Capsule 1 capsule with a meal Once a day, Taking Simvastatin 20 MG Tablet 1 tablet every evening Once a day, Taking Fish Oil 1000 MG Capsule 1 capsule with a meal Once a day, Taking Metoprolol Tartrate 25 mg Tablet 1 tablet Twice a day, Taking Warfarin Sodium 5 MG Tablet 1 and 1/2 tablets everyday except 1 tablet Thursday and Sunday Once a day, Medication List reviewed and reconciled with the patient       Allergies: N.K.D.A.          Vitals: Wt 184, Wt change -.2 lb, Ht 70.5, BMI 26.03, Pulse sitting 130s, BP sitting 106/76.       Examination:  Cardiology, General:  GENERAL APPEARANCE: pleasant, NAD. CAROTID UPSTROKE: normal, no bruit. JVD: flat. HEART SOUNDS: normal S1, S2, no S3 or S4, irregularly irregular with tachycardia. MURMUR: absent. LUNGS: no rales or wheezes. ABDOMEN: soft, non tender, positive bowel sounds. EXTREMITIES: no leg edema. PERIPHERAL PULSES: 2 plus bilateral.  Assessment:  1. Atrial fibrillation - 427.31 (Primary)  2. Mitral valve disorder - 424.0        1. Atrial fibrillation  Increase Metoprolol Tartrate Tablet, 25 mg, 2 tablet, Orally, Twice a day ; Continue Warfarin Sodium Tablet, 5 MG, 1 and 1/2 tablets everyday except 1 tablet Thursday and Sunday, Orally, Once a day .  LAB: Basic Metabolic LAB: CBC with Diff Imaging: EKG see scanned EKG Atrial fibrillation/flutter, RVR     Plummer,Wanda 02/02/2013 09:42:10 AM > Plummer,Wanda 02/02/2013  09:48:09 AM > printed as system failed to save it FERGUSON,CYNTHIA A 02/02/2013 11:20:30 AM > printed EKG also reviewed with Dr Anne Fu, and scanned    Notes: Reviewed Cardioversion procedure including potential risk including skin irritation, strokes, arrthymias, and reaction to sedatives,  also pt has had TEE in past and procedure reviewed including need for sedation and possibe side effects. INR yesterday in office of 4.2, [address] Pharm D updated of upcoming procedure and plan to reassess INR when returns to office in 3 days. plan of care per Dr Anne Fu.        Procedures:  Venipuncture:  Venipuncture: Smith,Michele 02/02/2013 11:09:07 AM > , performed in left arm.           Labs:    Lab: Basic Metabolic  eGFR (AFRICAN AMERICAN) 92  >60 - calc  GLUCOSE 95  70-99 - mg/dL  BUN 12  9-60 - mg/dL  CREATININE 4.54  0.98-1.19 - mg/dl  eGFR (NON-AFRICAN AMERICAN) 76  >60 - calc  SODIUM 139  136-145 - mmol/L  POTASSIUM 4.5  3.5-5.5 - mmol/L  CHLORIDE 104  98-107 - mmol/L  C02 27  22-32 - mmol/L  ANION GAP 12.1  6.0-20.0 - mmol/L  CALCIUM 9.6  8.6-10.3 - mg/dL   FERGUSON,CYNTHIA A 14/78/2956 01:30:07 PM > ok for cardioversion, repeat PT/INR scheduled in 2 days        Lab: CBC with Diff  WBC 6.4  4.0-11.0 - K/ul  RBC 4.08 L 4.20-5.80 - M/uL  HGB 13.1  13.0-17.0 - g/dL  HCT 21.3 L 08.6-57.8 - %  MCH 32.0  27.0-33.0 - pg  MPV 10.0  7.5-10.7 - fL  MCV 93.1  80.0-94.0 - fL  MCHC 34.4  32.0-36.0 - g/dL  RDW 46.9  62.9-52.8 - %  NRBC# 0.01  -   PLT 251  150-400 - K/uL  NEUT % 61.1  43.3-71.9 - %  NRBC% 0.10  - %  LYMPH% 29.6  16.8-43.5 - %  MONO % 6.5  4.6-12.4 - %  EOS % 1.9  0.0-7.8 - %  BASO % 0.9  0.0-1.0 - %  NEUT # 3.9  1.9-7.2 - K/uL  LYMPH# 1.90  1.10-2.70 - K/uL  MONO # 0.4  0.3-0.8 - K/uL  EOS # 0.1  0.0-0.6 - K/uL  BASO # 0.1  0.0-0.1 - K/uL   FERGUSON,CYNTHIA A 02/02/2013 12:10:22 PM > ok for TEE cardioversion          Procedure Codes: 41324 EKG I AND R,  80048 ECL BMP, 85025 ECL CBC PLATELET DIFF, 40102 BLOOD COLLECTION ROUTINE VENIPUNCTURE       Follow Up: CF 3 days (Reason: new onset Atrial fibrillation need EKG)

## 2013-02-09 ENCOUNTER — Encounter (HOSPITAL_COMMUNITY): Payer: Self-pay | Admitting: *Deleted

## 2013-02-09 ENCOUNTER — Encounter (HOSPITAL_COMMUNITY)
Admission: RE | Disposition: A | Discharge status: Home / Self Care | Payer: Self-pay | Source: Ambulatory Visit | Attending: Cardiology

## 2013-02-09 ENCOUNTER — Ambulatory Visit (HOSPITAL_COMMUNITY)
Admission: RE | Admit: 2013-02-09 | Discharge: 2013-02-09 | Disposition: A | Discharge status: Home / Self Care | Payer: Medicare Other | Source: Ambulatory Visit | Attending: Cardiology | Admitting: Cardiology

## 2013-02-09 DIAGNOSIS — I4891 Unspecified atrial fibrillation: Secondary | ICD-10-CM | POA: Insufficient documentation

## 2013-02-09 DIAGNOSIS — Z954 Presence of other heart-valve replacement: Secondary | ICD-10-CM | POA: Insufficient documentation

## 2013-02-09 DIAGNOSIS — K219 Gastro-esophageal reflux disease without esophagitis: Secondary | ICD-10-CM | POA: Insufficient documentation

## 2013-02-09 DIAGNOSIS — Z79899 Other long term (current) drug therapy: Secondary | ICD-10-CM | POA: Insufficient documentation

## 2013-02-09 DIAGNOSIS — K2270 Barrett's esophagus without dysplasia: Secondary | ICD-10-CM | POA: Insufficient documentation

## 2013-02-09 DIAGNOSIS — E785 Hyperlipidemia, unspecified: Secondary | ICD-10-CM | POA: Insufficient documentation

## 2013-02-09 DIAGNOSIS — N529 Male erectile dysfunction, unspecified: Secondary | ICD-10-CM | POA: Insufficient documentation

## 2013-02-09 DIAGNOSIS — Z538 Procedure and treatment not carried out for other reasons: Secondary | ICD-10-CM | POA: Insufficient documentation

## 2013-02-09 DIAGNOSIS — I10 Essential (primary) hypertension: Secondary | ICD-10-CM | POA: Insufficient documentation

## 2013-02-09 DIAGNOSIS — I059 Rheumatic mitral valve disease, unspecified: Secondary | ICD-10-CM | POA: Insufficient documentation

## 2013-02-09 SURGERY — ECHOCARDIOGRAM, TRANSESOPHAGEAL
Anesthesia: Moderate Sedation

## 2013-02-09 MED ORDER — SODIUM CHLORIDE 0.9 % IV SOLN: INTRAVENOUS | Status: DC

## 2013-03-01 ENCOUNTER — Encounter: Payer: Self-pay | Admitting: Thoracic Surgery (Cardiothoracic Vascular Surgery)

## 2013-03-01 ENCOUNTER — Ambulatory Visit (INDEPENDENT_AMBULATORY_CARE_PROVIDER_SITE_OTHER): Payer: Self-pay | Admitting: Thoracic Surgery (Cardiothoracic Vascular Surgery)

## 2013-03-01 VITALS — BP 116/76 | HR 78 | Resp 20 | Ht 71.0 in | Wt 188.0 lb

## 2013-03-01 DIAGNOSIS — I340 Nonrheumatic mitral (valve) insufficiency: Secondary | ICD-10-CM

## 2013-03-01 DIAGNOSIS — Z9889 Other specified postprocedural states: Secondary | ICD-10-CM

## 2013-03-01 DIAGNOSIS — I4891 Unspecified atrial fibrillation: Secondary | ICD-10-CM

## 2013-03-01 DIAGNOSIS — I059 Rheumatic mitral valve disease, unspecified: Secondary | ICD-10-CM

## 2013-03-01 NOTE — Progress Notes (Signed)
[address]       [name] [address]             [phone]     CARDIOTHORACIC SURGERY OFFICE NOTE  Referring Provider is Donato Schultz, MD PCP is Lillia Mountain, MD   HPI:   Patient returns for routine followup status post minimally invasive mitral valve repair on 01/06/2013.  He was last seen here in the office on 01/25/2013 at which time he was doing very well. Shortly after that he developed atrial fibrillation for which he was started on metoprolol and scheduled for cardioversion.  At the time he felt quite tired but did not have any significant shortness of breath.  By the time he presented for cardioversion on 02/09/2013 he had already spontaneously converted back to sinus rhythm.  Since then he has done very well. He reports that he feels fine. He walked 18 holes of golf yesterday. He has no shortness of breath. He has not had any tachypalpitations. He no longer has any significant pain in his chest and reports only very mild soreness. Overall he feels quite well.   Current Outpatient Prescriptions  Medication Sig Dispense Refill  . cetirizine (ZYRTEC) 10 MG tablet Take 10 mg by mouth daily as needed for allergies.       . Coenzyme Q10 (CO Q-10) 300 MG CAPS Take 1 capsule by mouth daily.      . fish oil-omega-3 fatty acids 1000 MG capsule Take 1 g by mouth daily.       . metoprolol succinate (TOPROL-XL) 50 MG 24 hr tablet Take 50 mg by mouth 2 (two) times daily. Take with or immediately following a meal.      . Multiple Vitamin (MULTIVITAMIN WITH MINERALS) TABS Take 1 tablet by mouth daily.      Marland Kitchen omeprazole (PRILOSEC OTC) 20 MG tablet Take 20 mg by mouth daily.      . sildenafil (VIAGRA) 50 MG tablet Take 50 mg by mouth as needed for erectile dysfunction.      . simvastatin (ZOCOR) 20 MG tablet Take 20 mg by mouth every evening.      . simvastatin (ZOCOR) 20 MG tablet Take 20 mg by mouth every evening.      . warfarin (COUMADIN) 5 MG tablet Take 1  tablet (5 mg total) by mouth daily at 6 PM.  30 tablet  3   No current facility-administered medications for this visit.      Physical Exam:   BP 116/76  Pulse 78  Resp 20  Ht 5\' 11"  (1.803 m)  Wt 188 lb (85.276 kg)  BMI 26.23 kg/m2  SpO2 98%  General:  Well-appearing  Chest:   Clear to auscultation  CV:   Regular rate and rhythm without murmur  Incisions:  Completely healed  Abdomen:  Soft and nontender  Extremities:  Warm and well-perfused  Diagnostic Tests:  n/a   Impression:  Patient is doing very well 2 months following minimally invasive mitral valve repair. He did develop atrial fibrillation last month but he spontaneously converted to sinus rhythm and has been maintaining sinus rhythm ever sinse. Overall he looks quite good.  He has not yet had a followup echocardiogram performed but he states that one has been scheduled by Dr. Anne Fu for later this summer.    Plan:  I've encouraged patient to continue to gradually increase his physical activity without any particular limitation at this time. We will plan to see  him back in 3 months to review the results of his late followup echocardiogram. All of his questions have been addressed.   Salvatore Decent. Cornelius Moras, MD 03/01/2013 2:29 PM

## 2013-03-01 NOTE — Patient Instructions (Signed)
The patient may continue to gradually increase their physical activity as tolerated without any particular limitations, although he should avoid activities that cause increased pain in their chest on the side of their surgical incision.

## 2013-06-03 ENCOUNTER — Other Ambulatory Visit: Payer: Self-pay | Admitting: *Deleted

## 2013-06-07 ENCOUNTER — Ambulatory Visit (INDEPENDENT_AMBULATORY_CARE_PROVIDER_SITE_OTHER): Payer: Medicare Other | Admitting: Thoracic Surgery (Cardiothoracic Vascular Surgery)

## 2013-06-07 ENCOUNTER — Encounter: Payer: Self-pay | Admitting: Thoracic Surgery (Cardiothoracic Vascular Surgery)

## 2013-06-07 VITALS — BP 129/86 | HR 77 | Resp 16 | Ht 71.0 in

## 2013-06-07 DIAGNOSIS — I340 Nonrheumatic mitral (valve) insufficiency: Secondary | ICD-10-CM

## 2013-06-07 DIAGNOSIS — Z9889 Other specified postprocedural states: Secondary | ICD-10-CM

## 2013-06-07 DIAGNOSIS — I059 Rheumatic mitral valve disease, unspecified: Secondary | ICD-10-CM

## 2013-06-07 NOTE — Progress Notes (Signed)
      [address]       [name] [address]             [phone]     CARDIOTHORACIC SURGERY OFFICE NOTE  Referring Provider is Donato Schultz, MD PCP is Lillia Mountain, MD   HPI:  Patient returns for routine followup status post minimally invasive mitral valve repair on 01/06/2013. He was last seen here in the office on 03/01/2013. Since then he has done exceptionally well. He reports normal physical activity with excellent exercise tolerance. He has no shortness of breath. He has not had any further tachypalpitations suggestive of a recurrence of atrial fibrillation. He underwent a followup echocardiogram last month at Dr. Anne Fu office and was told that it looked good. He remains on Coumadin.   Current Outpatient Prescriptions  Medication Sig Dispense Refill  . cetirizine (ZYRTEC) 10 MG tablet Take 10 mg by mouth daily as needed for allergies.       . Coenzyme Q10 (CO Q-10) 300 MG CAPS Take 1 capsule by mouth daily.      . fish oil-omega-3 fatty acids 1000 MG capsule Take 1 g by mouth daily.       . metoprolol succinate (TOPROL-XL) 50 MG 24 hr tablet Take 50 mg by mouth 2 (two) times daily. Take with or immediately following a meal.      . Multiple Vitamin (MULTIVITAMIN WITH MINERALS) TABS Take 1 tablet by mouth daily.      Marland Kitchen omeprazole (PRILOSEC OTC) 20 MG tablet Take 20 mg by mouth daily.      . sildenafil (VIAGRA) 50 MG tablet Take 50 mg by mouth as needed for erectile dysfunction.      . simvastatin (ZOCOR) 20 MG tablet Take 20 mg by mouth every evening.      . simvastatin (ZOCOR) 20 MG tablet Take 20 mg by mouth every evening.      . warfarin (COUMADIN) 5 MG tablet Take 1 tablet (5 mg total) by mouth daily at 6 PM.  30 tablet  3   No current facility-administered medications for this visit.      Physical Exam:   BP 129/86  Pulse 77  Resp 16  Ht 5\' 11"  (1.803 m)  SpO2 95%  General:  Well-appearing  Chest:   Clear to auscultation  CV:   Regular  rate and rhythm without murmur  Incisions:  Completely healed  Abdomen:  Soft nontender  Extremities:  Warm and well-perfused  Diagnostic Tests:  n/a   Impression:  Patient is doing well 5 months following minimally invasive mitral valve repair.  By report his recent followup echocardiogram looked good.  Plan:  We will obtain a copy of the report from his recent echocardiogram for review. In the future the patient will call and return to see [place] as needed. I think it would be reasonable to stop Coumadin at any point but will defer to Dr. Anne Fu' decision.   Salvatore Decent. Cornelius Moras, MD 06/07/2013 12:33 PM

## 2013-07-28 ENCOUNTER — Encounter: Payer: Self-pay | Admitting: Cardiology

## 2013-07-28 ENCOUNTER — Ambulatory Visit (INDEPENDENT_AMBULATORY_CARE_PROVIDER_SITE_OTHER): Payer: Medicare Other | Admitting: Cardiology

## 2013-07-28 VITALS — BP 141/78 | HR 71 | Ht 71.0 in

## 2013-07-28 DIAGNOSIS — I059 Rheumatic mitral valve disease, unspecified: Secondary | ICD-10-CM

## 2013-07-28 DIAGNOSIS — I10 Essential (primary) hypertension: Secondary | ICD-10-CM

## 2013-07-28 DIAGNOSIS — E785 Hyperlipidemia, unspecified: Secondary | ICD-10-CM

## 2013-07-28 DIAGNOSIS — Z9889 Other specified postprocedural states: Secondary | ICD-10-CM

## 2013-07-28 NOTE — Progress Notes (Signed)
[address] [place], [place]  [address] Phone: [phone] Fax:  [phone]  Date:  07/28/2013   ID:  James [name], DOB 11/26/1945, MRN [id number]  PCP:  Lillia Mountain, MD   History of Present Illness: James [name] is a 67 y.o. male with minimally invasive mitral valve repair on 01/06/13 secondary to severe mitral regurgitation/prolapse with brief postoperative atrial fibrillation that spontaneously converted prior to cardioversion here for followup.  He is now off of Coumadin. Doing well. He still continues to take his metoprolol. No side effects from this medication. He has not had any palpitations. Exercising well. Very satisfied. He has graduated from Dr. Orvan July clinic  Dr. Valentina Lucks is following his lipids. Doing well   Wt Readings from Last 3 Encounters:  03/01/13 188 lb (85.276 kg)  01/25/13 188 lb (85.276 kg)  01/10/13 188 lb 11.2 oz (85.594 kg)     Past Medical History  Diagnosis Date  . Hypertension   . GERD (gastroesophageal reflux disease)   . Barrett esophagus 2002  . Severe mitral regurgitation   . Hyperlipidemia   . Erectile dysfunction   . Pneumonia 2000    hospitalized  . S/P mitral valve repair 01/06/2013    Complex valvuloplasty including triangular resection of flail segment of posterior leaflet, artificial Gore-tex neocord placement x4 and 32 mm Sorin Memo 3D ring annuloplasty via right mini thoracotomy    Past Surgical History  Procedure Laterality Date  . Tee without cardioversion N/A 12/03/2012    Procedure: TRANSESOPHAGEAL ECHOCARDIOGRAM (TEE);  Surgeon: Donato Schultz, MD;  Location: [facility] ENDOSCOPY;  Service: Cardiovascular;  Laterality: N/A;  h/p in file cabinet-ja  . Colonoscopy    . Cardiac catheterization    . Mitral valve repair Right 01/06/2013    Procedure: MINIMALLY INVASIVE MITRAL VALVE REPAIR (MVR);  Surgeon: Purcell Nails, MD;  Location: [facility] OR;  Service: Open Heart Surgery;  Laterality: Right;  . Intraoperative  transesophageal echocardiogram N/A 01/06/2013    Procedure: INTRAOPERATIVE TRANSESOPHAGEAL ECHOCARDIOGRAM;  Surgeon: Purcell Nails, MD;  Location: [facility] OR;  Service: Open Heart Surgery;  Laterality: N/A;    Current Outpatient Prescriptions  Medication Sig Dispense Refill  . cetirizine (ZYRTEC) 10 MG tablet Take 10 mg by mouth daily as needed for allergies.       . Coenzyme Q10 (CO Q-10) 300 MG CAPS Take 1 capsule by mouth daily.      . fish oil-omega-3 fatty acids 1000 MG capsule Take 1 g by mouth daily.       . metoprolol succinate (TOPROL-XL) 50 MG 24 hr tablet Take 50 mg by mouth 2 (two) times daily. Take with or immediately following a meal.      . Multiple Vitamin (MULTIVITAMIN WITH MINERALS) TABS Take 1 tablet by mouth daily.      Marland Kitchen omeprazole (PRILOSEC OTC) 20 MG tablet Take 20 mg by mouth daily.      . sildenafil (VIAGRA) 50 MG tablet Take 50 mg by mouth as needed for erectile dysfunction.      . simvastatin (ZOCOR) 20 MG tablet Take 20 mg by mouth every evening.       No current facility-administered medications for this visit.    Allergies:   No Known Allergies  Social History:  The patient  reports that he has never smoked. He has never used smokeless tobacco. He reports that he drinks about 12.6 ounces of alcohol per week. He reports that he does not use  illicit drugs.   ROS:  Please see the history of present illness.   No bleeding, no syncope, new strokelike symptoms, no chest pain, no shortness of breath   PHYSICAL EXAM: VS:  BP 141/78  Pulse 71  Ht 5\' 11"  (1.803 m) Well nourished, well developed, in no acute distress HEENT: normal Neck: no JVD Cardiac:  normal S1, S2; RRR; no murmur, minimally invasive mitral valve scar noted Lungs:  clear to auscultation bilaterally, no wheezing, rhonchi or rales Abd: soft, nontender, no hepatomegaly Ext: no edema Skin: warm and dry Neuro: no focal abnormalities noted      Echocardiogram 04/28/13 post repair:  1. There is mild  concentric left ventricle hypertrophy.  2. Left ventricular ejection fraction estimated by 2D at 50-60 percent.  3. There were no regional wall motion abnormalities.  4. Mild left atrial enlargement. 4.5cm  5. Mitral valve repair. Annuloplasty ring intact.  6. No mitral valve prolapse is present.  7. No mitral stenosis.  8. No mitral valve regurgitation.  9. Trivial tricuspid regurgitation.  10. Trace aortic valve regurgitation.  11. There is mild aortic root dilatation. 4.1cm at the sinus of Valsalva, 3.8 cm at the sinotubular junction  12. Analysis of mitral valve inflow, pulmonary vein Doppler and tissue Doppler suggests grade I diastolic dysfunction without elevated left atrial pressure.  ASSESSMENT AND PLAN:  1. Mitral valve repair-currently doing well. Echocardiogram reviewed as above. Mild left atrial enlargement noted at 4.5. Repair intact. 2. Postoperative/paroxysmal atrial fibrillation-this has resolved. He is no longer having any evidence of atrial fibrillation. His Coumadin has been stopped. Doing well. 3. Hypertension-upper normal today. Continue with metoprolol. 4. Hyperlipidemia-low-dose simvastatin. Monitored by Dr. Valentina Lucks. No side effects of medications.  Signed, Donato Schultz, MD [facility]  07/28/2013 11:11 AM

## 2013-07-28 NOTE — Patient Instructions (Signed)
Your physician recommends that you continue on your current medications as directed. Please refer to the Current Medication list given to you today.  Your physician wants you to follow-up in: 6 months with Dr. Skains. You will receive a reminder letter in the mail two months in advance. If you don't receive a letter, please call our office to schedule the follow-up appointment.  

## 2013-10-07 ENCOUNTER — Other Ambulatory Visit: Payer: Self-pay

## 2013-10-07 MED ORDER — METOPROLOL SUCCINATE ER 50 MG PO TB24: 50.0000 mg | ORAL_TABLET | Freq: Two times a day (BID) | ORAL | Status: DC

## 2014-01-20 ENCOUNTER — Ambulatory Visit: Payer: Medicare Other | Admitting: Cardiology

## 2014-02-23 ENCOUNTER — Ambulatory Visit (INDEPENDENT_AMBULATORY_CARE_PROVIDER_SITE_OTHER): Payer: Medicare Other | Admitting: Cardiology

## 2014-02-23 ENCOUNTER — Ambulatory Visit: Payer: Medicare Other | Admitting: Cardiology

## 2014-02-23 ENCOUNTER — Encounter: Payer: Self-pay | Admitting: Cardiology

## 2014-02-23 VITALS — BP 130/84 | HR 82 | Ht 71.0 in | Wt 189.0 lb

## 2014-02-23 DIAGNOSIS — Z9889 Other specified postprocedural states: Secondary | ICD-10-CM

## 2014-02-23 DIAGNOSIS — I059 Rheumatic mitral valve disease, unspecified: Secondary | ICD-10-CM

## 2014-02-23 DIAGNOSIS — I10 Essential (primary) hypertension: Secondary | ICD-10-CM

## 2014-02-23 DIAGNOSIS — E785 Hyperlipidemia, unspecified: Secondary | ICD-10-CM

## 2014-02-23 DIAGNOSIS — I340 Nonrheumatic mitral (valve) insufficiency: Secondary | ICD-10-CM

## 2014-02-23 MED ORDER — ASPIRIN EC 81 MG PO TBEC: 81.0000 mg | DELAYED_RELEASE_TABLET | Freq: Every day | ORAL | Status: DC

## 2014-02-23 NOTE — Progress Notes (Signed)
[place]. [address] [place], [place]  [address] Phone: [phone] Fax:  [phone]  Date:  02/23/2014   ID:  James [name], DOB [birth year], MRN [id number]  PCP:  Irven Shelling, MD   History of Present Illness: James [name] is a 68 y.o. male with minimally invasive mitral valve repair on 01/06/13 secondary to severe mitral regurgitation/prolapse with brief postoperative atrial fibrillation that spontaneously converted prior to cardioversion here for followup.  He is now off of Coumadin. Doing well. He still continues to take his metoprolol. No side effects from this medication. He has not had any palpitations. Exercising well. Very satisfied. He has graduated from Dr. Guy Sandifer clinic.  Hiking, doing well. Mts. Feeling good. People commented to him how his coloring is better. He is taking dental antibiotics. Aspirin.  Dr. Laurann Montana is following his lipids. Doing well   Wt Readings from Last 3 Encounters:  02/23/14 189 lb (85.73 kg)  03/01/13 188 lb (85.276 kg)  01/25/13 188 lb (85.276 kg)     Past Medical History  Diagnosis Date  . Hypertension   . GERD (gastroesophageal reflux disease)   . Barrett esophagus 2002  . Severe mitral regurgitation   . Hyperlipidemia   . Erectile dysfunction   . Pneumonia 2000    hospitalized  . S/P mitral valve repair 01/06/2013    Complex valvuloplasty including triangular resection of flail segment of posterior leaflet, artificial Gore-tex neocord placement x4 and 32 mm Sorin Memo 3D ring annuloplasty via right mini thoracotomy    Past Surgical History  Procedure Laterality Date  . Tee without cardioversion N/A 12/03/2012    Procedure: TRANSESOPHAGEAL ECHOCARDIOGRAM (TEE);  Surgeon: Candee Furbish, MD;  Location: [facility] ENDOSCOPY;  Service: Cardiovascular;  Laterality: N/A;  h/p in file cabinet-ja  . Colonoscopy    . Cardiac catheterization    . Mitral valve repair Right 01/06/2013    Procedure: MINIMALLY INVASIVE MITRAL VALVE REPAIR  (MVR);  Surgeon: Rexene Alberts, MD;  Location: [place];  Service: Open Heart Surgery;  Laterality: Right;  . Intraoperative transesophageal echocardiogram N/A 01/06/2013    Procedure: INTRAOPERATIVE TRANSESOPHAGEAL ECHOCARDIOGRAM;  Surgeon: Rexene Alberts, MD;  Location: [facility];  Service: Open Heart Surgery;  Laterality: N/A;    Current Outpatient Prescriptions  Medication Sig Dispense Refill  . cetirizine (ZYRTEC) 10 MG tablet Take 10 mg by mouth daily as needed for allergies.       . Coenzyme Q10 (CO Q-10) 300 MG CAPS Take 1 capsule by mouth daily.      . fish oil-omega-3 fatty acids 1000 MG capsule Take 1 g by mouth daily.       . metoprolol succinate (TOPROL-XL) 50 MG 24 hr tablet Take 1 tablet (50 mg total) by mouth 2 (two) times daily. Take with or immediately following a meal.  60 tablet  6  . Multiple Vitamin (MULTIVITAMIN WITH MINERALS) TABS Take 1 tablet by mouth daily.      Marland Kitchen omeprazole (PRILOSEC OTC) 20 MG tablet Take 20 mg by mouth daily.      . sildenafil (VIAGRA) 50 MG tablet Take 50 mg by mouth as needed for erectile dysfunction.      . simvastatin (ZOCOR) 20 MG tablet Take 20 mg by mouth every evening.       No current facility-administered medications for this visit.    Allergies:   No Known Allergies  Social History:  The patient  reports that he has never smoked. He has  never used smokeless tobacco. He reports that he drinks about 12.6 ounces of alcohol per week. He reports that he does not use illicit drugs.   ROS:  Please see the history of present illness.   No bleeding, no syncope, new strokelike symptoms, no chest pain, no shortness of breath   PHYSICAL EXAM: VS:  BP 130/84  Pulse 82  Ht 5\' 11"  (1.803 m)  Wt 189 lb (85.73 kg)  BMI 26.37 kg/m2 Well nourished, well developed, in no acute distress HEENT: normal Neck: no JVD Cardiac:  normal S1, S2; RRR; no murmur, minimally invasive mitral valve scar noted Lungs:  clear to auscultation bilaterally, no wheezing,  rhonchi or rales Abd: soft, nontender, no hepatomegaly Ext: no edema Skin: warm and dry Neuro: no focal abnormalities noted  ECG: 02/23/14-sinus rhythm, 82, nonspecific ST changes      Echocardiogram 04/28/13 post repair:  1. There is mild concentric left ventricle hypertrophy. 2. Left ventricular ejection fraction estimated by 2D at 50-60 percent. 3. There were no regional wall motion abnormalities. 4. Moderate left atrial enlargement. 4.5cm 5. Mitral valve repair. Annuloplasty ring intact. 6. No mitral valve prolapse is present. 7. No mitral stenosis. 8. No mitral valve regurgitation. 9. Trivial tricuspid regurgitation. 10. Trace aortic valve regurgitation. 11. There is mild aortic root dilatation. 4.1cm at the sinus of Valsalva, 3.8 cm at the sinotubular junction 12. Analysis of mitral valve inflow, pulmonary vein Doppler and tissue Doppler suggests grade I diastolic dysfunction without elevated left atrial pressure.  ASSESSMENT AND PLAN:  1. Mitral valve repair-currently doing well. Echocardiogram reviewed as above. Moderate left atrial enlargement noted at 4.5. Repair intact. Doing well. ABX dental. ASA.  2. Postoperative/paroxysmal atrial fibrillation-this has resolved. He is no longer having any evidence of atrial fibrillation. His Coumadin has been stopped. Doing well. 3. Hypertension- Continue with metoprolol. Excellent. 4. Hyperlipidemia-low-dose simvastatin. Monitored by Dr. Laurann Montana. No side effects of medications. Exercise. 5. 1 year f/u.  Signed, Candee Furbish, MD [facility]  02/23/2014 11:21 AM

## 2014-02-23 NOTE — Patient Instructions (Signed)
Your physician has recommended you make the following change in your medication:  1) START Aspirin 81mg  daily  Take all other medications as prescribed  Your physician wants you to follow-up in: 1 year You will receive a reminder letter in the mail two months in advance. If you don't receive a letter, please call our office to schedule the follow-up appointment.

## 2014-05-09 ENCOUNTER — Other Ambulatory Visit: Payer: Self-pay | Admitting: *Deleted

## 2014-05-09 MED ORDER — METOPROLOL SUCCINATE ER 50 MG PO TB24
50.0000 mg | ORAL_TABLET | Freq: Two times a day (BID) | ORAL | Status: DC
Start: 2014-05-09 — End: 2014-09-25

## 2014-09-08 ENCOUNTER — Other Ambulatory Visit: Payer: Self-pay | Admitting: Dermatology

## 2014-09-12 ENCOUNTER — Other Ambulatory Visit: Payer: Self-pay | Admitting: Gastroenterology

## 2014-09-25 ENCOUNTER — Other Ambulatory Visit: Payer: Self-pay | Admitting: Cardiology

## 2014-09-26 NOTE — Telephone Encounter (Signed)
Rx(s) sent to pharmacy electronically.  

## 2014-10-13 ENCOUNTER — Encounter (HOSPITAL_COMMUNITY): Payer: Self-pay | Admitting: Cardiology

## 2015-01-12 IMAGING — CR DG CHEST 1V PORT
1 series · 1 of 1 positions shown · non-contrast
Comparison: Chest x-ray 01/04/2013.

CLINICAL DATA: Postoperative study.

PORTABLE CHEST - 1 VIEW

[AP]
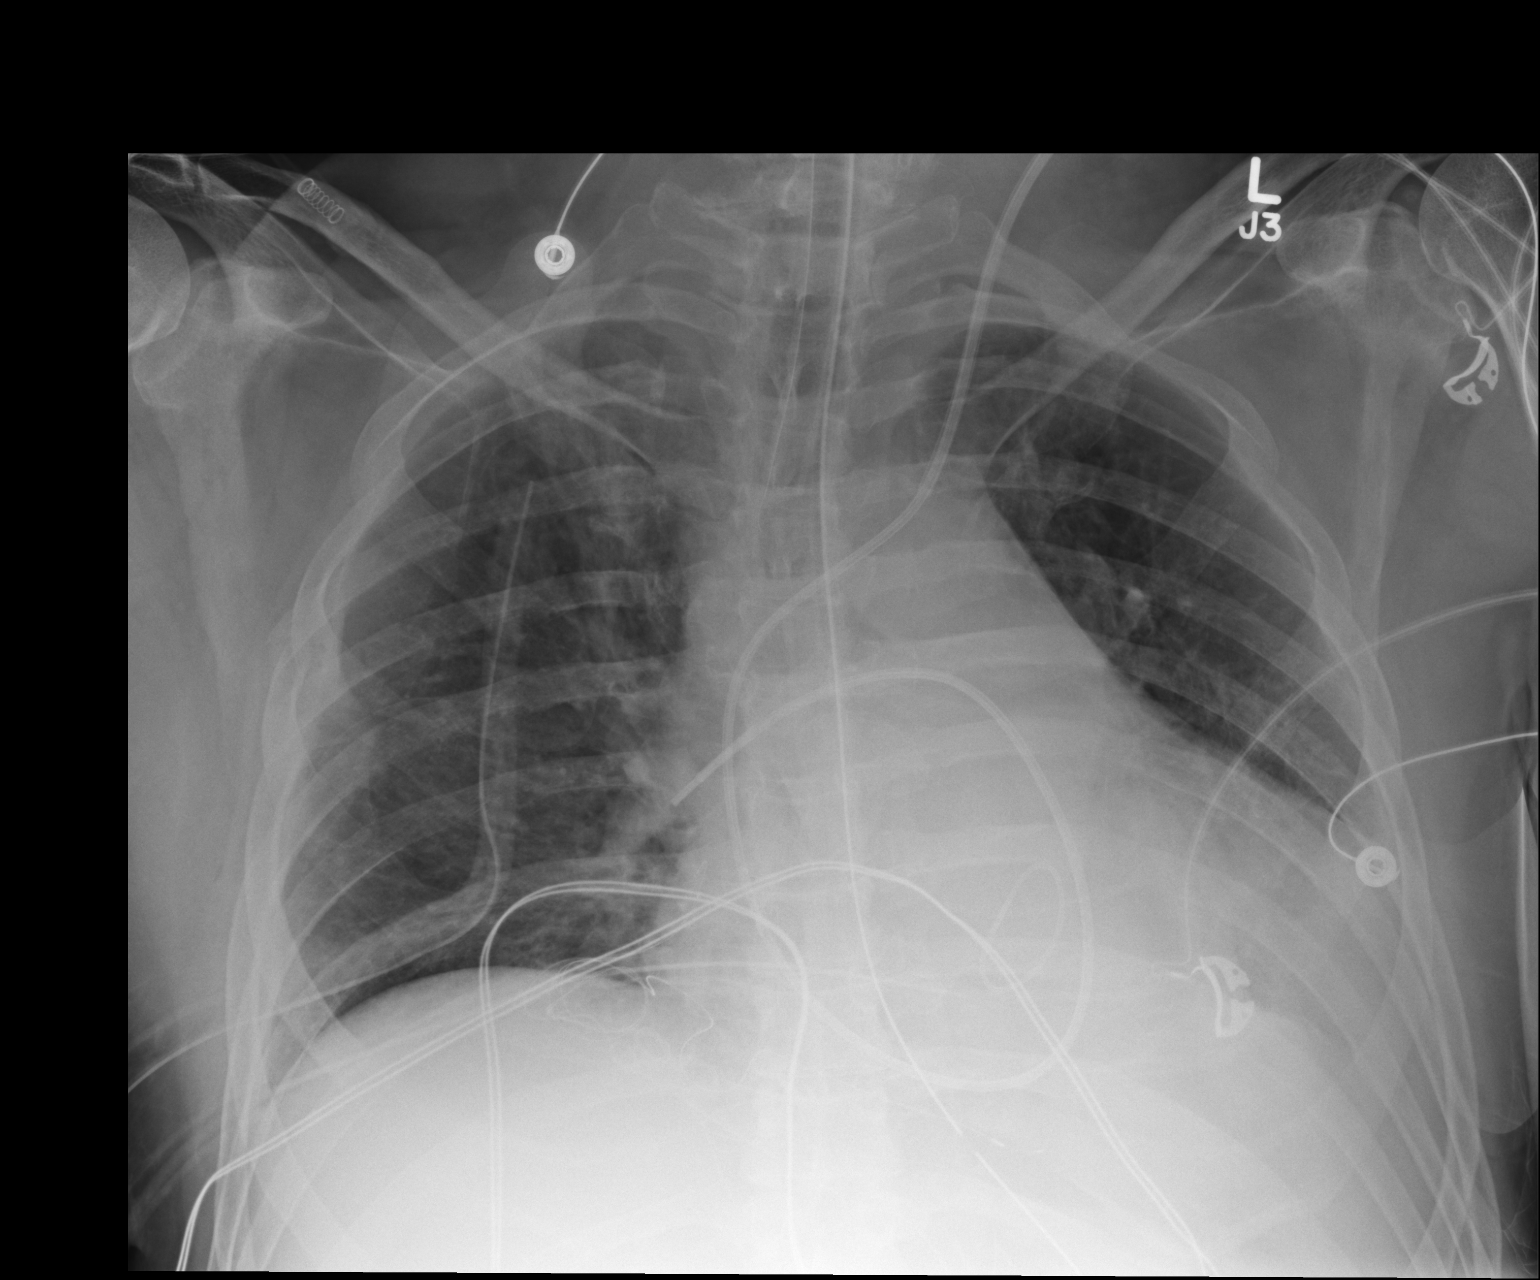

[1 of 1 positions shown; findings below may reference images not displayed]

FINDINGS: An endotracheal tube is in place with tip 4.1 cm above
the carina. Right-sided chest tube with tip projecting over the
right apex.  Left internal jugular central venous Cordis through
which a Swan Ganz catheter has been floated into a right descending
pulmonary artery branch.  A nasogastric tube in position, with side
port near the gastroesophageal junction.  Epicardial pacing wires
are noted.  A mitral annuloplasty ring is new.  Mild enlargement of
the cardiopericardial silhouette.  Left lower lobe opacity favored
to reflect resolving postoperative subsegmental atelectasis and
possible small left pleural effusion. The patient is rotated to the
left on today's exam, resulting in distortion of the mediastinal
contours and reduced diagnostic sensitivity and specificity for
mediastinal pathology.  No definite pneumothorax.
IMPRESSION: 1.  Postoperative changes related to recent minimally invasive
mitral valve repair and annuloplasty, with support apparatus as
above.
2.  Low lung volumes with probable left lower lobe subsegmental
atelectasis and a small left pleural effusion.
3.  Mild enlargement of the cardiopericardial silhouette is likely
within normal limits in this postoperative patient, and likely
accentuated by patient's rotation to the left.

## 2015-01-16 IMAGING — CR DG CHEST 2V
2 series · 2 of 2 positions shown · non-contrast
Comparison: Plain film chest 01/08/2013.

CLINICAL DATA: Chest tube removal.

CHEST - 2 VIEW

[w chest pa]
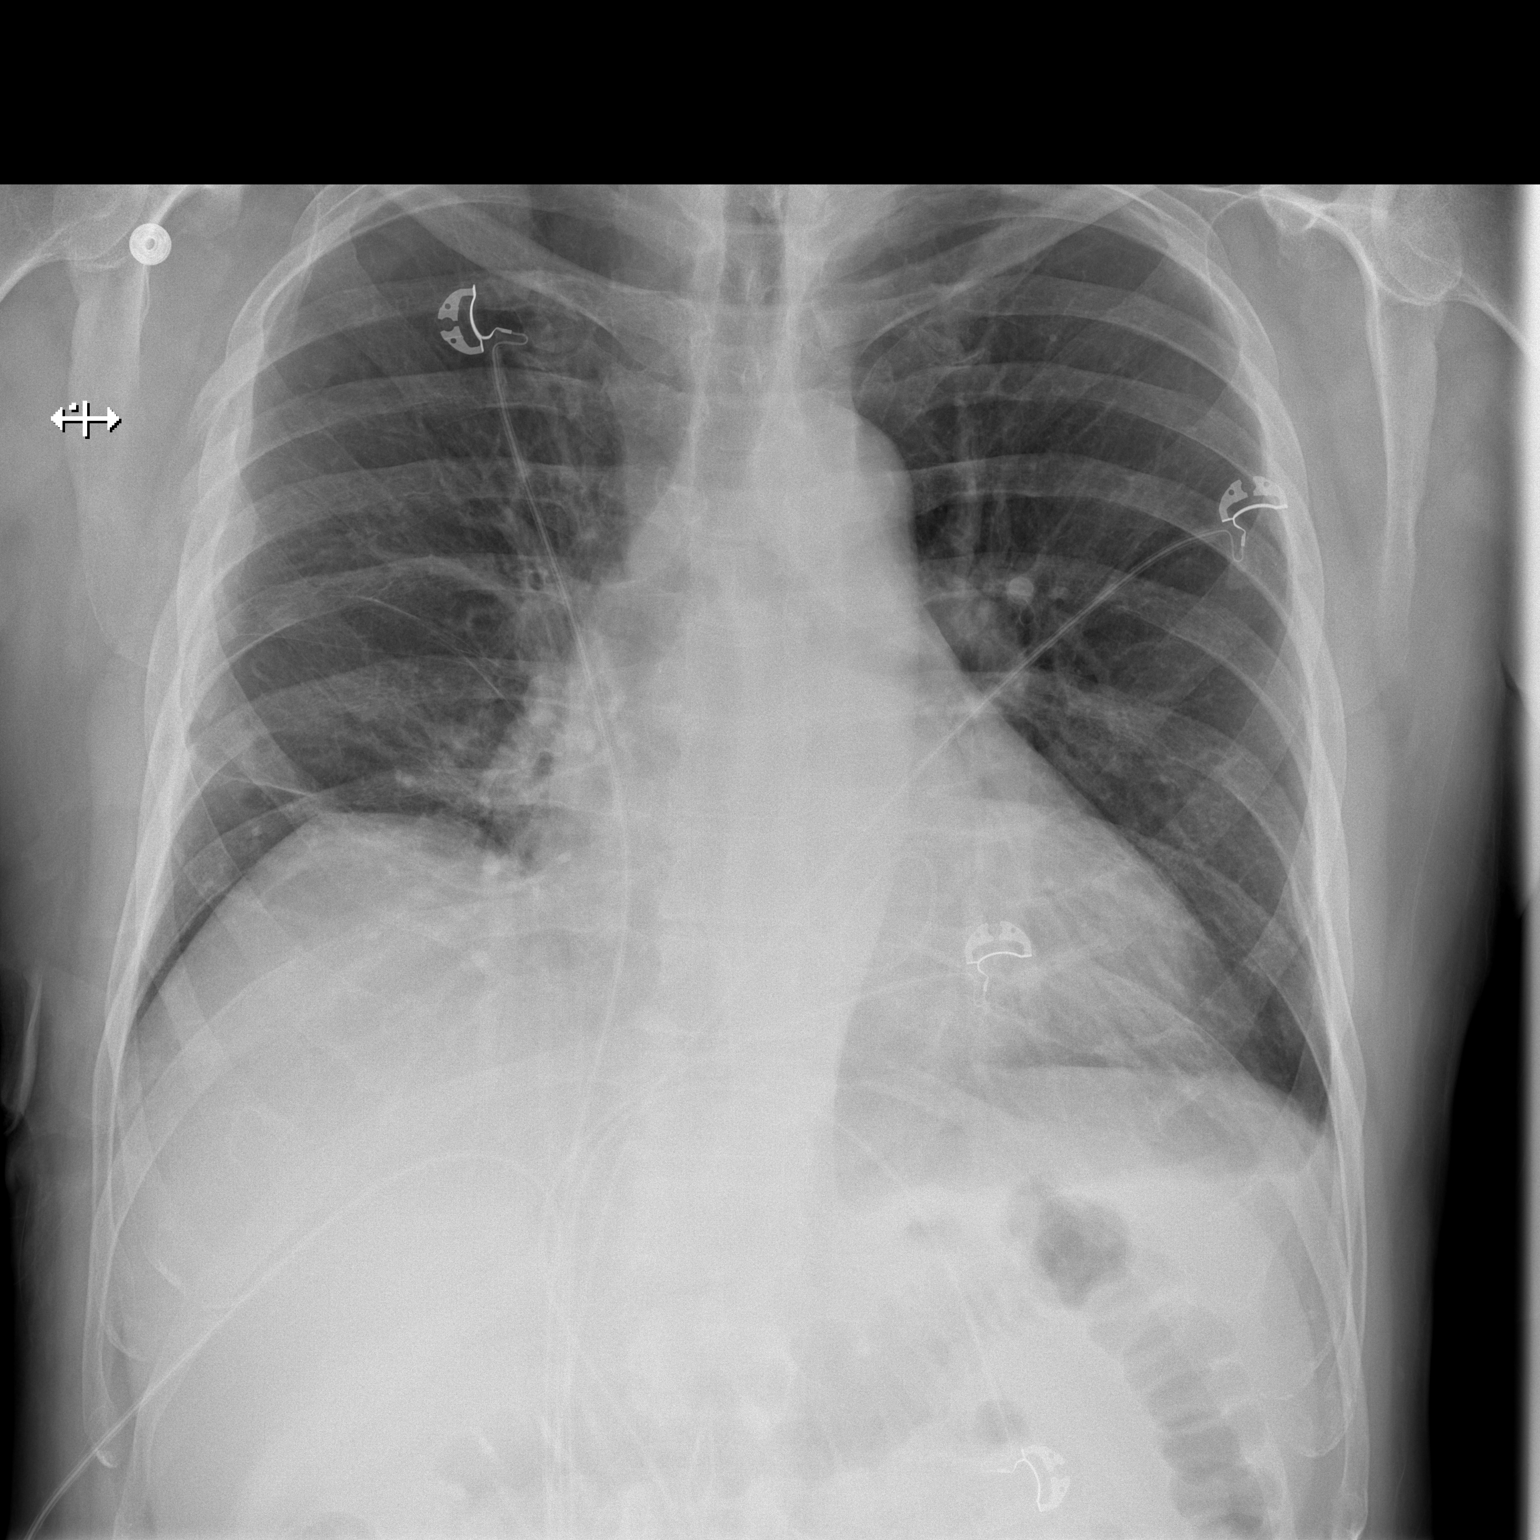

[w chest lat]
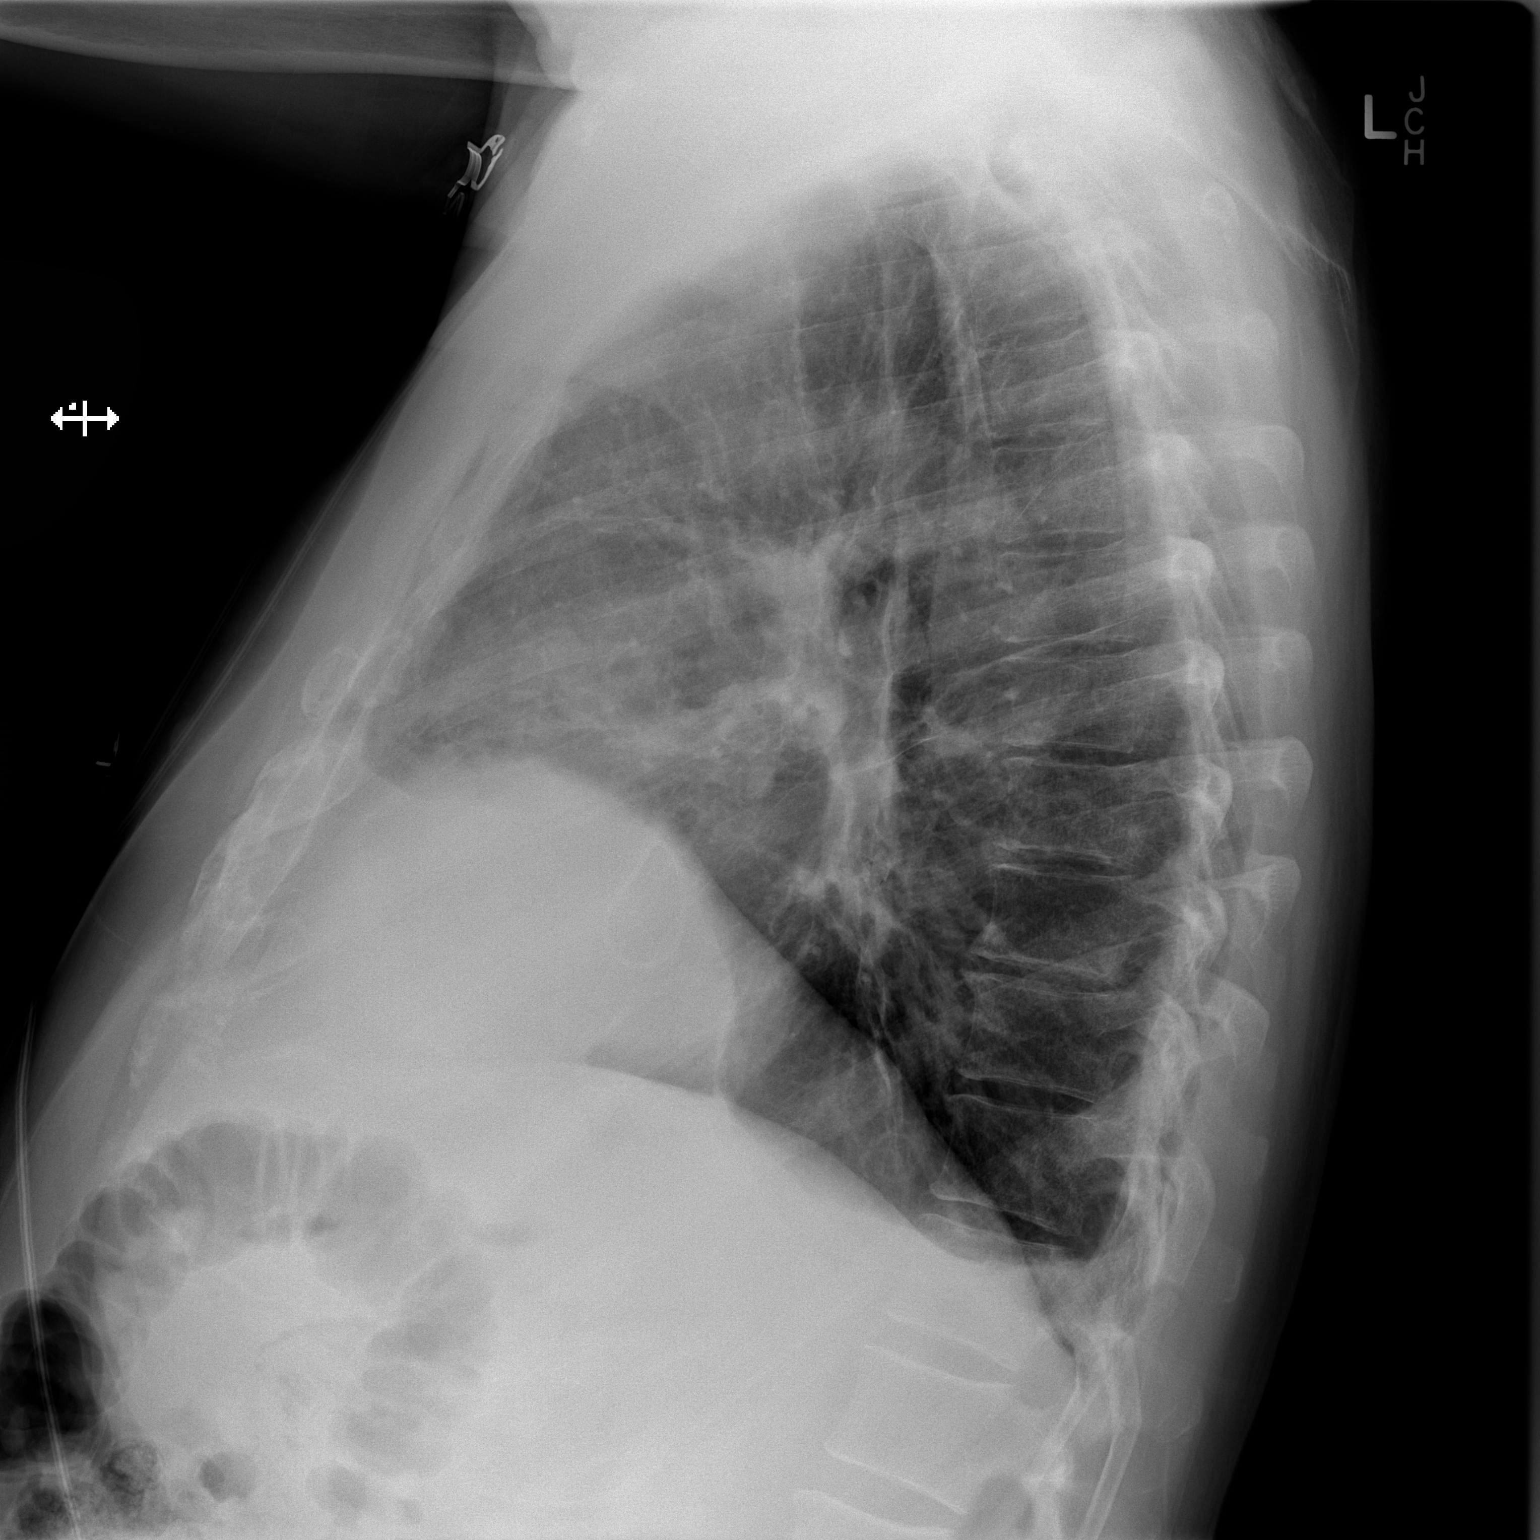

[2 of 2 positions shown; findings below may reference images not displayed]

FINDINGS: Two right chest tubes have been removed.  No pneumothorax
identified.  Minimal linear atelectasis is present in the right
lung base.  The patient has a trace left pleural effusion.
Cardiomegaly is noted.
IMPRESSION: 1.  Negative for pneumothorax after right chest tube removal.
2.  Trace left pleural effusion.
3.  Cardiomegaly without edema.

## 2015-02-03 ENCOUNTER — Other Ambulatory Visit: Payer: Self-pay

## 2015-02-03 MED ORDER — METOPROLOL SUCCINATE ER 50 MG PO TB24: ORAL_TABLET | ORAL | Status: DC

## 2015-03-15 ENCOUNTER — Ambulatory Visit: Payer: Self-pay | Admitting: Cardiology

## 2015-05-12 ENCOUNTER — Encounter: Payer: Self-pay | Admitting: Cardiology

## 2015-05-12 ENCOUNTER — Ambulatory Visit (INDEPENDENT_AMBULATORY_CARE_PROVIDER_SITE_OTHER): Payer: Medicare Other | Admitting: Cardiology

## 2015-05-12 VITALS — BP 122/80 | HR 79 | Ht 71.0 in | Wt 185.4 lb

## 2015-05-12 DIAGNOSIS — Z9889 Other specified postprocedural states: Secondary | ICD-10-CM | POA: Diagnosis not present

## 2015-05-12 DIAGNOSIS — I10 Essential (primary) hypertension: Secondary | ICD-10-CM | POA: Diagnosis not present

## 2015-05-12 NOTE — Progress Notes (Signed)
[place]. [address], [address] [place], [place]  [address] Phone: [phone] Fax:  [phone]  Date:  05/12/2015   ID:  James [name], DOB [birth year], MRN [id number]  PCP:  Irven Shelling, MD   History of Present Illness: James [name] is a 69 y.o. male with minimally invasive mitral valve repair on 01/06/13 secondary to severe mitral regurgitation/prolapse (Dr. Roxy Manns) with brief postoperative atrial fibrillation that spontaneously converted prior to cardioversion here for followup.  Doing well. He still continues to take his metoprolol. No side effects from this medication. He has not had any palpitations. Exercising well. Very satisfied.  Hiking, doing well. Mts [place]. Feeling good. People commented to him how his coloring is better. He is taking dental antibiotics. Aspirin.  Dr. Laurann Montana is following his lipids. Doing well   Wt Readings from Last 3 Encounters:  05/12/15 185 lb 6.4 oz (84.097 kg)  02/23/14 189 lb (85.73 kg)  03/01/13 188 lb (85.276 kg)     Past Medical History  Diagnosis Date  . Hypertension   . GERD (gastroesophageal reflux disease)   . Barrett esophagus 2002  . Severe mitral regurgitation   . Hyperlipidemia   . Erectile dysfunction   . Pneumonia 2000    hospitalized  . S/P mitral valve repair 01/06/2013    Complex valvuloplasty including triangular resection of flail segment of posterior leaflet, artificial Gore-tex neocord placement x4 and 32 mm Sorin Memo 3D ring annuloplasty via right mini thoracotomy    Past Surgical History  Procedure Laterality Date  . Tee without cardioversion N/A 12/03/2012    Procedure: TRANSESOPHAGEAL ECHOCARDIOGRAM (TEE);  Surgeon: Candee Furbish, MD;  Location: [facility] ENDOSCOPY;  Service: Cardiovascular;  Laterality: N/A;  h/p in file cabinet-ja  . Colonoscopy    . Cardiac catheterization    . Mitral valve repair Right 01/06/2013    Procedure: MINIMALLY INVASIVE MITRAL VALVE REPAIR (MVR);  Surgeon: Rexene Alberts, MD;   Location: [facility];  Service: Open Heart Surgery;  Laterality: Right;  . Intraoperative transesophageal echocardiogram N/A 01/06/2013    Procedure: INTRAOPERATIVE TRANSESOPHAGEAL ECHOCARDIOGRAM;  Surgeon: Rexene Alberts, MD;  Location: [place];  Service: Open Heart Surgery;  Laterality: N/A;    Current Outpatient Prescriptions  Medication Sig Dispense Refill  . aspirin EC 81 MG tablet Take 1 tablet (81 mg total) by mouth daily.    Marland Kitchen BOOSTRIX 5-2.5-18.5 injection   0  . cetirizine (ZYRTEC) 10 MG tablet Take 10 mg by mouth daily as needed for allergies.     . Coenzyme Q10 (CO Q-10) 300 MG CAPS Take 1 capsule by mouth daily.    . fish oil-omega-3 fatty acids 1000 MG capsule Take 1 g by mouth daily.     . metoprolol succinate (TOPROL-XL) 50 MG 24 hr tablet TAKE 1 TABLET BY MOUTH 2 TIMES DAILY. TAKE WITH OR IMMEDIATELY FOLLOWING A MEAL. 180 tablet 0  . Multiple Vitamin (MULTIVITAMIN WITH MINERALS) TABS Take 1 tablet by mouth daily.    Marland Kitchen omeprazole (PRILOSEC OTC) 20 MG tablet Take 20 mg by mouth daily.    . sildenafil (VIAGRA) 50 MG tablet Take 50 mg by mouth as needed for erectile dysfunction.    . simvastatin (ZOCOR) 20 MG tablet Take 20 mg by mouth every evening.     No current facility-administered medications for this visit.    Allergies:   No Known Allergies  Social History:  The patient  reports that he has never smoked. He has never used  smokeless tobacco. He reports that he drinks about 12.6 oz of alcohol per week. He reports that he does not use illicit drugs.   ROS:  Please see the history of present illness.   No bleeding, no syncope, new strokelike symptoms, no chest pain, no shortness of breath   PHYSICAL EXAM: VS:  BP 122/80 mmHg  Pulse 79  Ht 5\' 11"  (1.803 m)  Wt 185 lb 6.4 oz (84.097 kg)  BMI 25.87 kg/m2 Well nourished, well developed, in no acute distress HEENT: normal Neck: no JVD Cardiac:  normal S1, S2; RRR; no murmur, minimally invasive mitral valve scar noted Lungs:   clear to auscultation bilaterally, no wheezing, rhonchi or rales Abd: soft, nontender, no hepatomegaly Ext: no edema Skin: warm and dry Neuro: no focal abnormalities noted  ECG: Today-05/12/15-sinus rhythm, first-degree AV block, heart rate 79, no other abnormalities 02/23/14-sinus rhythm, 82, nonspecific ST changes      Echocardiogram 04/28/13 post repair:  1. There is mild concentric left ventricle hypertrophy. 2. Left ventricular ejection fraction estimated by 2D at 50-60 percent. 3. There were no regional wall motion abnormalities. 4. Moderate left atrial enlargement. 4.5cm 5. Mitral valve repair. Annuloplasty ring intact. 6. No mitral valve prolapse is present. 7. No mitral stenosis. 8. No mitral valve regurgitation. 9. Trivial tricuspid regurgitation. 10. Trace aortic valve regurgitation. 11. There is mild aortic root dilatation. 4.1cm at the sinus of Valsalva, 3.8 cm at the sinotubular junction 12. Analysis of mitral valve inflow, pulmonary vein Doppler and tissue Doppler suggests grade I diastolic dysfunction without elevated left atrial pressure.  ASSESSMENT AND PLAN:  1. Mitral valve repair-currently doing well. Echocardiogram reviewed as above. Moderate left atrial enlargement noted at 4.5. Repair intact. Doing well. ABX dental. ASA.  2. Postoperative/paroxysmal atrial fibrillation-this has resolved. He is no longer having any evidence of atrial fibrillation. His Coumadin has been stopped. Doing well. 3. Hypertension- Continue with metoprolol. Excellent. 4. Hyperlipidemia-low-dose simvastatin. Monitored by Dr. Laurann Montana. No side effects of medications. Exercise. [place] 5. 1 year f/u.  Signed, Candee Furbish, MD [facility]  05/12/2015 1:32 PM

## 2015-05-12 NOTE — Patient Instructions (Signed)
Medication Instructions:  Your physician recommends that you continue on your current medications as directed. Please refer to the Current Medication list given to you today.  Follow-Up: Follow up in 1 year with Dr. Skains.  You will receive a letter in the mail 2 months before you are due.  Please call us when you receive this letter to schedule your follow up appointment.  Thank you for choosing [facility]!!       

## 2015-05-15 ENCOUNTER — Other Ambulatory Visit: Payer: Self-pay | Admitting: Cardiology

## 2016-06-12 ENCOUNTER — Ambulatory Visit (INDEPENDENT_AMBULATORY_CARE_PROVIDER_SITE_OTHER): Payer: Medicare Other | Admitting: Cardiology

## 2016-06-12 ENCOUNTER — Encounter (INDEPENDENT_AMBULATORY_CARE_PROVIDER_SITE_OTHER): Payer: Self-pay

## 2016-06-12 ENCOUNTER — Encounter: Payer: Self-pay | Admitting: Cardiology

## 2016-06-12 VITALS — BP 128/82 | HR 76 | Ht 71.0 in | Wt 188.0 lb

## 2016-06-12 DIAGNOSIS — Z9889 Other specified postprocedural states: Secondary | ICD-10-CM

## 2016-06-12 DIAGNOSIS — E785 Hyperlipidemia, unspecified: Secondary | ICD-10-CM | POA: Diagnosis not present

## 2016-06-12 DIAGNOSIS — I10 Essential (primary) hypertension: Secondary | ICD-10-CM

## 2016-06-12 NOTE — Patient Instructions (Addendum)
Medication Instructions:    Your physician recommends that you continue on your current medications as directed. Please refer to the Current Medication list given to you today.  --- If you need a refill on your cardiac medications before your next appointment, please call your pharmacy. ---  Labwork:  None ordered  Testing/Procedures:  None ordered  Follow-Up:  Your physician wants you to follow-up in: 1 year with Dr. Marlou Porch.  You will receive a reminder letter in the mail two months in advance. If you don't receive a letter, please call our office to schedule the follow-up appointment.   Thank you for choosing [facility]!!

## 2016-06-12 NOTE — Progress Notes (Signed)
[facility]. [address], [address], [place]  [address] Phone: [phone] Fax:  [phone]  Date:  06/12/2016   ID:  James [name], DOB 1945-11-08, MRN [id number]  PCP:  Irven Shelling, MD   History of Present Illness: James [name] is a 70 y.o. male with minimally invasive mitral valve repair on 01/06/13 secondary to severe mitral regurgitation/prolapse (Dr. Roxy Manns) with brief postoperative atrial fibrillation that spontaneously converted prior to cardioversion here for followup.  Doing well. He still continues to take his metoprolol. No side effects from this medication. He has not had any palpitations. Exercising well. Very satisfied.  Hiking, doing well. His accidents and vigorous hiking in the mountains. No symptoms. Feeling good. People commented to him how his coloring is better. He is taking dental antibiotics. Aspirin.  Dr. Laurann Montana is following his lipids. Doing well. Changed him to atorvastatin 20 mg.   Wt Readings from Last 3 Encounters:  06/12/16 188 lb (85.3 kg)  05/12/15 185 lb 6.4 oz (84.1 kg)  02/23/14 189 lb (85.7 kg)     Past Medical History:  Diagnosis Date  . Barrett esophagus 2002  . Erectile dysfunction   . GERD (gastroesophageal reflux disease)   . Hyperlipidemia   . Hypertension   . Pneumonia 2000   hospitalized  . S/P mitral valve repair 01/06/2013   Complex valvuloplasty including triangular resection of flail segment of posterior leaflet, artificial Gore-tex neocord placement x4 and 32 mm Sorin Memo 3D ring annuloplasty via right mini thoracotomy  . Severe mitral regurgitation     Past Surgical History:  Procedure Laterality Date  . CARDIAC CATHETERIZATION    . COLONOSCOPY    . INTRAOPERATIVE TRANSESOPHAGEAL ECHOCARDIOGRAM N/A 01/06/2013   Procedure: INTRAOPERATIVE TRANSESOPHAGEAL ECHOCARDIOGRAM;  Surgeon: Rexene Alberts, MD;  Location: [facility];  Service: Open Heart Surgery;  Laterality: N/A;  . MITRAL VALVE REPAIR Right 01/06/2013   Procedure: MINIMALLY INVASIVE MITRAL VALVE REPAIR (MVR);  Surgeon: Rexene Alberts, MD;  Location: [facility];  Service: Open Heart Surgery;  Laterality: Right;  . TEE WITHOUT CARDIOVERSION N/A 12/03/2012   Procedure: TRANSESOPHAGEAL ECHOCARDIOGRAM (TEE);  Surgeon: Candee Furbish, MD;  Location: [facility] ENDOSCOPY;  Service: Cardiovascular;  Laterality: N/A;  h/p in file cabinet-ja    Current Outpatient Prescriptions  Medication Sig Dispense Refill  . aspirin EC 81 MG tablet Take 1 tablet (81 mg total) by mouth daily.    Marland Kitchen atorvastatin (LIPITOR) 20 MG tablet Take 1 tablet by mouth every evening.    Marland Kitchen BOOSTRIX 5-2.5-18.5 injection Inject 0.5 mLs into the muscle once.   0  . cetirizine (ZYRTEC) 10 MG tablet Take 10 mg by mouth daily as needed for allergies.     . Coenzyme Q10 (CO Q-10) 300 MG CAPS Take 1 capsule by mouth daily.    . fish oil-omega-3 fatty acids 1000 MG capsule Take 1 g by mouth daily.     . metoprolol succinate (TOPROL-XL) 50 MG 24 hr tablet TAKE 1 TABLET BY MOUTH 2 TIMES DAILY. TAKE WITH OR IMMEDIATELY FOLLOWING A MEAL. 180 tablet 3  . Multiple Vitamin (MULTIVITAMIN WITH MINERALS) TABS Take 1 tablet by mouth daily.    Marland Kitchen omeprazole (PRILOSEC OTC) 20 MG tablet Take 20 mg by mouth daily.    . sildenafil (VIAGRA) 50 MG tablet Take 50 mg by mouth as needed for erectile dysfunction.     No current facility-administered medications for this visit.     Allergies:   No Known Allergies  Social History:  The patient  reports that he has never smoked. He has never used smokeless tobacco. He reports that he drinks about 12.6 oz of alcohol per week . He reports that he does not use drugs.   ROS:  Please see the history of present illness.   No bleeding, no syncope, new strokelike symptoms, no chest pain, no shortness of breath   PHYSICAL EXAM: VS:  BP 128/82   Pulse 76   Ht 5\' 11"  (1.803 m)   Wt 188 lb (85.3 kg)   BMI 26.22 kg/m  Well nourished, well developed, in no acute distress HEENT:  normal Neck: no JVD Cardiac:  normal S1, S2; RRR; no murmur, minimally invasive mitral valve scar noted Lungs:  clear to auscultation bilaterally, no wheezing, rhonchi or rales Abd: soft, nontender, no hepatomegaly Ext: no edema Skin: warm and dry Neuro: no focal abnormalities noted  ECG: Today-05/12/15-sinus rhythm, first-degree AV block, heart rate 79, no other abnormalities 02/23/14-sinus rhythm, 82, nonspecific ST changes      Echocardiogram 04/28/13 post repair:  1. There is mild concentric left ventricle hypertrophy. 2. Left ventricular ejection fraction estimated by 2D at 50-60 percent. 3. There were no regional wall motion abnormalities. 4. Moderate left atrial enlargement. 4.5cm 5. Mitral valve repair. Annuloplasty ring intact. 6. No mitral valve prolapse is present. 7. No mitral stenosis. 8. No mitral valve regurgitation. 9. Trivial tricuspid regurgitation. 10. Trace aortic valve regurgitation. 11. There is mild aortic root dilatation. 4.1cm at the sinus of Valsalva, 3.8 cm at the sinotubular junction 12. Analysis of mitral valve inflow, pulmonary vein Doppler and tissue Doppler suggests grade I diastolic dysfunction without elevated left atrial pressure.  ASSESSMENT AND PLAN:  1. Mitral valve repair-currently doing well. Echocardiogram reviewed as above. Moderate left atrial enlargement noted at 4.5. Repair intact. Doing well. ABX dental. ASA. No changes. Quite active. Vigorous hiking at times in the mountains. 2. Postoperative/paroxysmal atrial fibrillation-this has resolved. He is no longer having any evidence of atrial fibrillation. His Coumadin has been stopped. Doing well. 3. Hypertension- Continue with metoprolol. Excellent. 4. Hyperlipidemia-low-dose simvastatin. Monitored by Dr. Laurann Montana. No side effects of medications. Exercise. Enjoys going to [place] the [place]. 5. 1 year f/u.  Signed, Candee Furbish, MD [facility]  06/12/2016 9:10 AM

## 2016-07-02 ENCOUNTER — Other Ambulatory Visit: Payer: Self-pay | Admitting: Cardiology

## 2016-08-01 ENCOUNTER — Other Ambulatory Visit: Payer: Self-pay | Admitting: Internal Medicine

## 2016-08-01 DIAGNOSIS — K858 Other acute pancreatitis without necrosis or infection: Secondary | ICD-10-CM

## 2016-08-13 ENCOUNTER — Ambulatory Visit
Admission: RE | Admit: 2016-08-13 | Discharge: 2016-08-13 | Disposition: A | Discharge status: Home / Self Care | Payer: Medicare Other | Source: Ambulatory Visit | Attending: Internal Medicine | Admitting: Internal Medicine

## 2016-08-13 DIAGNOSIS — K858 Other acute pancreatitis without necrosis or infection: Secondary | ICD-10-CM

## 2016-08-13 MED ORDER — GADOBENATE DIMEGLUMINE 529 MG/ML IV SOLN
17.0000 mL | Freq: Once | INTRAVENOUS | Status: AC | PRN
  Administered 2016-08-13: 17 mL via INTRAVENOUS

## 2016-10-31 ENCOUNTER — Other Ambulatory Visit: Payer: Self-pay | Admitting: Internal Medicine

## 2016-10-31 DIAGNOSIS — K863 Pseudocyst of pancreas: Secondary | ICD-10-CM

## 2016-11-06 ENCOUNTER — Ambulatory Visit
Admission: RE | Admit: 2016-11-06 | Discharge: 2016-11-06 | Disposition: A | Discharge status: Home / Self Care | Payer: Medicare Other | Source: Ambulatory Visit | Attending: Internal Medicine | Admitting: Internal Medicine

## 2016-11-06 DIAGNOSIS — K863 Pseudocyst of pancreas: Secondary | ICD-10-CM

## 2016-11-06 MED ORDER — IOPAMIDOL (ISOVUE-300) INJECTION 61%
100.0000 mL | Freq: Once | INTRAVENOUS | Status: AC | PRN
  Administered 2016-11-06: 100 mL via INTRAVENOUS

## 2017-06-26 ENCOUNTER — Telehealth: Payer: Self-pay | Admitting: Cardiology

## 2017-06-26 NOTE — Telephone Encounter (Signed)
Pt was added onto Dr Olin Pia schedule for in the AM.

## 2017-06-26 NOTE — Telephone Encounter (Signed)
New Message  Pt call requesting to speak with RN. Pt states his hear is beating at about 130 beats. Pt states he is concerned and would like to speak with RN. Please call back to discuss

## 2017-06-26 NOTE — Telephone Encounter (Signed)
Spoke with pt who is reporting about 4 to 5 days ago he noticed that his HR seemed to be a little high at night when laying on his left side in bed.  Yesterday he was at his PCP office and saw a pharmacist who checked his VS and stated his HR was 120-130 bpm.  His BP is "OK" per his report.  He denies feeling as if he is out of rhythm.  His only complaint is not feeling "100%" but nothing specific.   He does have a remote history of At Fib after surgery many years ago.  He is on Metoprolol succinate 50 mg BID and NOT on any anticoagulant.  Normally his HR is 70 to 80 bpm.  Advised I will review with MD and c/b with further orders.

## 2017-06-27 ENCOUNTER — Encounter: Payer: Self-pay | Admitting: Internal Medicine

## 2017-06-27 ENCOUNTER — Encounter (INDEPENDENT_AMBULATORY_CARE_PROVIDER_SITE_OTHER): Payer: Self-pay

## 2017-06-27 ENCOUNTER — Ambulatory Visit (INDEPENDENT_AMBULATORY_CARE_PROVIDER_SITE_OTHER): Payer: Medicare Other | Admitting: Internal Medicine

## 2017-06-27 ENCOUNTER — Encounter: Payer: Self-pay | Admitting: *Deleted

## 2017-06-27 VITALS — BP 90/74 | HR 145 | Ht 71.0 in | Wt 187.0 lb

## 2017-06-27 DIAGNOSIS — R000 Tachycardia, unspecified: Secondary | ICD-10-CM

## 2017-06-27 DIAGNOSIS — I4892 Unspecified atrial flutter: Secondary | ICD-10-CM | POA: Diagnosis not present

## 2017-06-27 DIAGNOSIS — Z01812 Encounter for preprocedural laboratory examination: Secondary | ICD-10-CM | POA: Diagnosis not present

## 2017-06-27 LAB — BASIC METABOLIC PANEL
BUN/Creatinine Ratio: 16 (ref 10–24)
BUN: 15 mg/dL (ref 8–27)
CALCIUM: 9.3 mg/dL (ref 8.6–10.2)
CO2: 19 mmol/L — ABNORMAL LOW (ref 20–29)
CREATININE: 0.95 mg/dL (ref 0.76–1.27)
Chloride: 100 mmol/L (ref 96–106)
GFR, EST AFRICAN AMERICAN: 93 mL/min/{1.73_m2} (ref >59)
GFR, EST NON AFRICAN AMERICAN: 81 mL/min/{1.73_m2} (ref >59)
Glucose: 102 mg/dL — ABNORMAL HIGH (ref 65–99)
POTASSIUM: 4.8 mmol/L (ref 3.5–5.2)
Sodium: 141 mmol/L (ref 134–144)

## 2017-06-27 LAB — CBC WITH DIFFERENTIAL/PLATELET
BASOS: 0 %
Basophils Absolute: 0 10*3/uL (ref 0.0–0.2)
EOS (ABSOLUTE): 0 10*3/uL (ref 0.0–0.4)
EOS: 0 %
HEMATOCRIT: 41.2 % (ref 37.5–51.0)
Hemoglobin: 14.7 g/dL (ref 13.0–17.7)
IMMATURE GRANS (ABS): 0 10*3/uL (ref 0.0–0.1)
IMMATURE GRANULOCYTES: 0 %
Lymphocytes Absolute: 1.5 10*3/uL (ref 0.7–3.1)
Lymphs: 17 %
MCH: 33.6 pg — ABNORMAL HIGH (ref 26.6–33.0)
MCHC: 35.7 g/dL (ref 31.5–35.7)
MCV: 94 fL (ref 79–97)
Monocytes Absolute: 0.8 10*3/uL (ref 0.1–0.9)
Monocytes: 9 %
NEUTROS ABS: 6.3 10*3/uL (ref 1.4–7.0)
NEUTROS PCT: 74 %
PLATELETS: 200 10*3/uL (ref 150–379)
RBC: 4.37 x10E6/uL (ref 4.14–5.80)
RDW: 12.8 % (ref 12.3–15.4)
WBC: 8.7 10*3/uL (ref 3.4–10.8)

## 2017-06-27 MED ORDER — APIXABAN 5 MG PO TABS: 5.0000 mg | ORAL_TABLET | Freq: Two times a day (BID) | ORAL | 11 refills | Status: DC

## 2017-06-27 MED ORDER — APIXABAN 5 MG PO TABS: 5.0000 mg | ORAL_TABLET | Freq: Two times a day (BID) | ORAL | 0 refills | Status: DC

## 2017-06-27 NOTE — Patient Instructions (Signed)
Medication Instructions: - Your physician has recommended you make the following change in your medication:  1) STOP aspirin 2) START eliquis 5 mg- take 1 tablet by mouth twice daily  Labwork: - Your physician recommends that you lab work today: CBC/ BMP  Procedures/Testing: - Your physician has requested that you have a TEE/Cardioversion. During a TEE, sound waves are used to create images of your heart. It provides your doctor with information about the size and shape of your heart and how well your heart's chambers and valves are working. In this test, a transducer is attached to the end of a flexible tube that is guided down you throat and into your esophagus (the tube leading from your mouth to your stomach) to get a more detailed image of your heart. Once the TEE has determined that a blood clot is not present, the cardioversion begins. Electrical Cardioversion uses a jolt of electricity to your heart either through paddles or wired patches attached to your chest. This is a controlled, usually prescheduled, procedure. This procedure is done at the hospital and you are not awake during the procedure. You usually go home the day of the procedure. Please see the instruction sheet given to you today for more information.  Follow-Up: - Your physician recommends that you schedule a follow-up appointment in: about 4 weeks with Dr. Rayann Heman- consult for atrial flutter ablation   Any Additional Special Instructions Will Be Listed Below (If Applicable).     If you need a refill on your cardiac medications before your next appointment, please call your pharmacy.

## 2017-06-27 NOTE — Progress Notes (Signed)
Patient Care Team: Lavone Orn, MD as PCP - General (Internal Medicine) Jerline Pain, MD as Attending Physician (Cardiology) Rexene Alberts, MD as Attending Physician (Cardiothoracic Surgery)   HPI  James [name] is a 71 y.o. male Seen as an addon as DOD for tachycardia  He has hx of Minimally invasive mitral valve repair 4/14 2/2 MVP/MR.  Procedure was complicated by post op Afib. EF at that time 70%  He is found to be in atrial flutter with 2:1 conduction. He is having some chest discomfort. He has some lightheadedness. He is having no shortness of breath.  This is his first episode. His only atrial fibrillation of which she is aware was postoperative.  Thromboembolic risk factors ( age -76, HTN-1) for a CHADSVASc Score of 2  He drinks alcohol admitting to 2-4 drinks a night.   Records and Results Reviewed   Past Medical History:  Diagnosis Date  . Barrett esophagus 2002  . Erectile dysfunction   . GERD (gastroesophageal reflux disease)   . Hyperlipidemia   . Hypertension   . Pneumonia 2000   hospitalized  . S/P mitral valve repair 01/06/2013   Complex valvuloplasty including triangular resection of flail segment of posterior leaflet, artificial Gore-tex neocord placement x4 and 32 mm Sorin Memo 3D ring annuloplasty via right mini thoracotomy  . Severe mitral regurgitation     Past Surgical History:  Procedure Laterality Date  . CARDIAC CATHETERIZATION    . COLONOSCOPY    . INTRAOPERATIVE TRANSESOPHAGEAL ECHOCARDIOGRAM N/A 01/06/2013   Procedure: INTRAOPERATIVE TRANSESOPHAGEAL ECHOCARDIOGRAM;  Surgeon: Rexene Alberts, MD;  Location: [facility];  Service: Open Heart Surgery;  Laterality: N/A;  . MITRAL VALVE REPAIR Right 01/06/2013   Procedure: MINIMALLY INVASIVE MITRAL VALVE REPAIR (MVR);  Surgeon: Rexene Alberts, MD;  Location: [facility];  Service: Open Heart Surgery;  Laterality: Right;  . TEE WITHOUT CARDIOVERSION N/A 12/03/2012   Procedure: TRANSESOPHAGEAL  ECHOCARDIOGRAM (TEE);  Surgeon: Candee Furbish, MD;  Location: [facility] ENDOSCOPY;  Service: Cardiovascular;  Laterality: N/A;  h/p in file cabinet-ja    Current Outpatient Prescriptions  Medication Sig Dispense Refill  . aspirin EC 81 MG tablet Take 1 tablet (81 mg total) by mouth daily.    Marland Kitchen atorvastatin (LIPITOR) 20 MG tablet Take 1 tablet by mouth every evening.    Marland Kitchen BOOSTRIX 5-2.5-18.5 injection Inject 0.5 mLs into the muscle once.   0  . cetirizine (ZYRTEC) 10 MG tablet Take 10 mg by mouth daily as needed for allergies.     . Coenzyme Q10 (CO Q-10) 300 MG CAPS Take 1 capsule by mouth daily.    . fish oil-omega-3 fatty acids 1000 MG capsule Take 1 g by mouth daily.     . metoprolol succinate (TOPROL-XL) 50 MG 24 hr tablet TAKE 1 TABLET BY MOUTH TWICE DAILY WITH OR IMMEDIATELY FOLLOWING A MEAL 180 tablet 3  . Multiple Vitamin (MULTIVITAMIN WITH MINERALS) TABS Take 1 tablet by mouth daily.    Marland Kitchen omeprazole (PRILOSEC OTC) 20 MG tablet Take 20 mg by mouth daily.    . sildenafil (VIAGRA) 50 MG tablet Take 50 mg by mouth as needed for erectile dysfunction.     No current facility-administered medications for this visit.     No Known Allergies    Review of Systems negative except from HPI and PMH  Physical Exam BP 90/74   Pulse (!) 145   Ht 5\' 11"  (1.803 m)   Wt 187 lb (84.8  kg)   SpO2 98%   BMI 26.08 kg/m  Well developed and well nourished in no acute distress HENT normal E scleral and icterus clear Neck Supple JVP flat; carotids brisk and full Clear to ausculation Rapid butRegular rate and rhythm, no murmurs gallops or rub Soft with active bowel sounds No clubbing cyanosis  Edema Alert and oriented, grossly normal motor and sensory function Skin Warm and Dry  ECG demonstrates atrial flutter-2:1 conduction at a rate of 144 -/08/32  Assessment and  Plan   Atrial flutter 2:1 conduction  Mitral valve surgery status post repair for mitral valve prolapse  ETOH use/abuse  The  patient has atrial flutter.  Low blood pressure precludes up titration of rate control. We will begin him on ELIQUIS and anticipate cardioversion with TEE next week.  We will stop his aspirin.  We have reviewed the likelihood that atrial fibrillation will cooccuring with his atrial flutter. We have further reviewed the proportional relationship between alcohol intake and atrial arrhythmias. As well as the impact of alcohol on bleeding in the context of anticoagulation. I've encouraged   restraint.  Given his history of a prior atriotomy his mitral valve surgery albeit in the interatrial septum, I have referred him to have follow-up with Dr. Greggory Brandy consider flutter ablation. I suspect typical but his mapping skills will be available if it turned out to be atypical. urrent medicines are reviewed at length with the patient today .  The patient does not  have concerns regarding medicines.

## 2017-06-29 ENCOUNTER — Telehealth: Payer: Self-pay | Admitting: Student

## 2017-06-29 NOTE — Telephone Encounter (Signed)
    The patient called to report he was recently started on Eliquis for atrial flutter. Starting this weekend, he developed pain along his left knee and wanted to make sure these were not related. Reports having similar pain in the past but reports this is slightly worse. Pain is exacerbated with activity. Notes mild swelling at the site but no erythema. Recommended he rest his knee this weekend, utilize ice, and take Tylenol PRN. Recommended avoiding Motrin and Aleve while on Eliquis. He will contact his Orthopedist if the pain persists. Will continue on Eliquis with plans for TEE-DCCV on Tuesday.   He voiced understanding of this and was appreciative of the call.  Signed, Erma Heritage, PA-C 06/29/2017, 8:51 AM Pager: [phone]

## 2017-07-01 ENCOUNTER — Ambulatory Visit (HOSPITAL_COMMUNITY): Payer: Medicare Other | Admitting: Certified Registered Nurse Anesthetist

## 2017-07-01 ENCOUNTER — Ambulatory Visit (HOSPITAL_COMMUNITY)
Admission: RE | Admit: 2017-07-01 | Discharge: 2017-07-01 | Disposition: A | Discharge status: Home / Self Care | Payer: Medicare Other | Source: Ambulatory Visit | Attending: Cardiology | Admitting: Cardiology

## 2017-07-01 ENCOUNTER — Ambulatory Visit (HOSPITAL_BASED_OUTPATIENT_CLINIC_OR_DEPARTMENT_OTHER): Payer: Medicare Other

## 2017-07-01 ENCOUNTER — Encounter (HOSPITAL_COMMUNITY)
Admission: RE | Disposition: A | Discharge status: Home / Self Care | Payer: Self-pay | Source: Ambulatory Visit | Attending: Cardiology

## 2017-07-01 ENCOUNTER — Encounter (HOSPITAL_COMMUNITY): Payer: Self-pay

## 2017-07-01 DIAGNOSIS — Z7982 Long term (current) use of aspirin: Secondary | ICD-10-CM | POA: Insufficient documentation

## 2017-07-01 DIAGNOSIS — I4892 Unspecified atrial flutter: Secondary | ICD-10-CM | POA: Diagnosis not present

## 2017-07-01 DIAGNOSIS — I340 Nonrheumatic mitral (valve) insufficiency: Secondary | ICD-10-CM | POA: Diagnosis not present

## 2017-07-01 DIAGNOSIS — K219 Gastro-esophageal reflux disease without esophagitis: Secondary | ICD-10-CM | POA: Diagnosis not present

## 2017-07-01 DIAGNOSIS — I4891 Unspecified atrial fibrillation: Secondary | ICD-10-CM | POA: Insufficient documentation

## 2017-07-01 DIAGNOSIS — N529 Male erectile dysfunction, unspecified: Secondary | ICD-10-CM | POA: Insufficient documentation

## 2017-07-01 DIAGNOSIS — I10 Essential (primary) hypertension: Secondary | ICD-10-CM | POA: Insufficient documentation

## 2017-07-01 DIAGNOSIS — E785 Hyperlipidemia, unspecified: Secondary | ICD-10-CM | POA: Diagnosis not present

## 2017-07-01 DIAGNOSIS — F1010 Alcohol abuse, uncomplicated: Secondary | ICD-10-CM | POA: Diagnosis not present

## 2017-07-01 HISTORY — PX: CARDIOVERSION: SHX1299

## 2017-07-01 HISTORY — PX: TEE WITHOUT CARDIOVERSION: SHX5443

## 2017-07-01 SURGERY — CARDIOVERSION
Anesthesia: Monitor Anesthesia Care

## 2017-07-01 MED ORDER — PROPOFOL 10 MG/ML IV BOLUS
INTRAVENOUS | Status: DC | PRN
  Administered 2017-07-01 (×2): 20 mg via INTRAVENOUS

## 2017-07-01 MED ORDER — LACTATED RINGERS IV SOLN
INTRAVENOUS | Status: DC
  Administered 2017-07-01: 10:00:00 via INTRAVENOUS

## 2017-07-01 MED ORDER — PROPOFOL 500 MG/50ML IV EMUL
INTRAVENOUS | Status: DC | PRN
  Administered 2017-07-01: 100 ug/kg/min via INTRAVENOUS

## 2017-07-01 MED ORDER — BUTAMBEN-TETRACAINE-BENZOCAINE 2-2-14 % EX AERO
INHALATION_SPRAY | CUTANEOUS | Status: DC | PRN
  Administered 2017-07-01: 2 via TOPICAL

## 2017-07-01 NOTE — Anesthesia Procedure Notes (Signed)
Procedure Name: MAC Date/Time: 07/01/2017 10:50 AM Performed by: Kyung Rudd Pre-anesthesia Checklist: Patient identified, Emergency Drugs available, Suction available and Patient being monitored Patient Re-evaluated:Patient Re-evaluated prior to induction Oxygen Delivery Method: Nasal cannula Induction Type: IV induction Placement Confirmation: positive ETCO2

## 2017-07-01 NOTE — H&P (Signed)
Office Visit   06/27/2017 [facility]  Deboraha Sprang, MD  Cardiology   Tachycardia +2 more  Dx   Referred by Lavone Orn, MD  Reason for Visit   Additional Documentation   Vitals:   BP 90/74   Pulse  145   Ht 5\' 11"  (1.803 m)   Wt 84.8 kg (187 lb)   SpO2 98%   BMI 26.08 kg/m   BSA 2.06 m   Flowsheets:   Custom Formula Data,   MEWS Score,   Anthropometrics     Encounter Info:   Billing Info,   History,   Allergies,   Detailed Report     All Notes   Procedures by Deboraha Sprang, MD at 06/30/2017 10:40 AM   Author: Deboraha Sprang, MD Author Type: Physician Filed: 06/30/2017 10:40 AM  Note Status: Signed Cosign: Cosign Not Required Encounter Date: 06/27/2017  Editor: Sallee Provencal L        Scan on 06/30/2017 10:40 AM by Sallee Provencal L : Ekg - [facility]  Progress Notes by Deboraha Sprang, MD at 06/27/2017 8:30 AM   Author: Deboraha Sprang, MD Author Type: Physician Filed: 06/27/2017 9:57 AM  Note Status: Signed Cosign: Cosign Not Required Encounter Date: 06/27/2017  Editor: Deboraha Sprang, MD (Physician)         Patient Care Team: Lavone Orn, MD as PCP - General (Internal Medicine) Jerline Pain, MD as Attending Physician (Cardiology) Rexene Alberts, MD as Attending Physician (Cardiothoracic Surgery)   HPI  Isaah Furry is a 71 y.o. male Seen as an addon as DOD for tachycardia  He has hx of Minimally invasive mitral valve repair 4/14 2/2 MVP/MR.  Procedure was complicated by post op Afib. EF at that time 70%  He is found to be in atrial flutter with 2:1 conduction. He is having some chest discomfort. He has some lightheadedness. He is having no shortness of breath.  This is his first episode. His only atrial fibrillation of which she is aware was postoperative.  Thromboembolic risk factors ( age -84, HTN-1) for a CHADSVASc Score of 2  He drinks alcohol admitting to 2-4 drinks a  night.   Records and Results Reviewed       Past Medical History:  Diagnosis Date  . Barrett esophagus 2002  . Erectile dysfunction   . GERD (gastroesophageal reflux disease)   . Hyperlipidemia   . Hypertension   . Pneumonia 2000   hospitalized  . S/P mitral valve repair 01/06/2013   Complex valvuloplasty including triangular resection of flail segment of posterior leaflet, artificial Gore-tex neocord placement x4 and 32 mm Sorin Memo 3D ring annuloplasty via right mini thoracotomy  . Severe mitral regurgitation          Past Surgical History:  Procedure Laterality Date  . CARDIAC CATHETERIZATION    . COLONOSCOPY    . INTRAOPERATIVE TRANSESOPHAGEAL ECHOCARDIOGRAM N/A 01/06/2013   Procedure: INTRAOPERATIVE TRANSESOPHAGEAL ECHOCARDIOGRAM;  Surgeon: Rexene Alberts, MD;  Location: [facility];  Service: Open Heart Surgery;  Laterality: N/A;  . MITRAL VALVE REPAIR Right 01/06/2013   Procedure: MINIMALLY INVASIVE MITRAL VALVE REPAIR (MVR);  Surgeon: Rexene Alberts, MD;  Location: [facility];  Service: Open Heart Surgery;  Laterality: Right;  . TEE WITHOUT CARDIOVERSION N/A 12/03/2012   Procedure: TRANSESOPHAGEAL ECHOCARDIOGRAM (TEE);  Surgeon: Candee Furbish, MD;  Location: [facility] ENDOSCOPY;  Service: Cardiovascular;  Laterality: N/A;  h/p in file cabinet-ja  Current Outpatient Prescriptions  Medication Sig Dispense Refill  . aspirin EC 81 MG tablet Take 1 tablet (81 mg total) by mouth daily.    Marland Kitchen atorvastatin (LIPITOR) 20 MG tablet Take 1 tablet by mouth every evening.    Marland Kitchen BOOSTRIX 5-2.5-18.5 injection Inject 0.5 mLs into the muscle once.   0  . cetirizine (ZYRTEC) 10 MG tablet Take 10 mg by mouth daily as needed for allergies.     . Coenzyme Q10 (CO Q-10) 300 MG CAPS Take 1 capsule by mouth daily.    . fish oil-omega-3 fatty acids 1000 MG capsule Take 1 g by mouth daily.     . metoprolol succinate (TOPROL-XL) 50 MG 24 hr tablet TAKE 1 TABLET BY MOUTH TWICE DAILY  WITH OR IMMEDIATELY FOLLOWING A MEAL 180 tablet 3  . Multiple Vitamin (MULTIVITAMIN WITH MINERALS) TABS Take 1 tablet by mouth daily.    Marland Kitchen omeprazole (PRILOSEC OTC) 20 MG tablet Take 20 mg by mouth daily.    . sildenafil (VIAGRA) 50 MG tablet Take 50 mg by mouth as needed for erectile dysfunction.     No current facility-administered medications for this visit.     No Known Allergies    Review of Systems negative except from HPI and PMH  Physical Exam BP 90/74   Pulse (!) 145   Ht 5\' 11"  (1.803 m)   Wt 187 lb (84.8 kg)   SpO2 98%   BMI 26.08 kg/m  Well developed and well nourished in no acute distress HENT normal E scleral and icterus clear Neck Supple JVP flat; carotids brisk and full Clear to ausculation Rapid butRegular rate and rhythm, no murmurs gallops or rub Soft with active bowel sounds No clubbing cyanosis  Edema Alert and oriented, grossly normal motor and sensory function Skin Warm and Dry  ECG demonstrates atrial flutter-2:1 conduction at a rate of 144 -/08/32  Assessment and  Plan   Atrial flutter 2:1 conduction  Mitral valve surgery status post repair for mitral valve prolapse  ETOH use/abuse  The patient has atrial flutter.  Low blood pressure precludes up titration of rate control. We will begin him on ELIQUIS and anticipate cardioversion with TEE next week.  We will stop his aspirin.  We have reviewed the likelihood that atrial fibrillation will cooccuring with his atrial flutter. We have further reviewed the proportional relationship between alcohol intake and atrial arrhythmias. As well as the impact of alcohol on bleeding in the context of anticoagulation. I've encouraged   restraint.  Given his history of a prior atriotomy his mitral valve surgery albeit in the interatrial septum, I have referred him to have follow-up with Dr. Greggory Brandy consider flutter ablation. I suspect typical but his mapping skills will be available if it  turned out to be atypical. urrent medicines are reviewed at length with the patient today . The patient does not  haveconcerns regarding medicines.     For TEE/DCCV; compliant with aixaban; no changes Kirk Ruths

## 2017-07-01 NOTE — Progress Notes (Signed)
  Echocardiogram Echocardiogram Transesophageal has been performed.  Merrie Roof F 07/01/2017, 12:01 PM

## 2017-07-01 NOTE — Anesthesia Preprocedure Evaluation (Signed)
Anesthesia Evaluation  Patient identified by MRN, date of birth, ID band Patient awake    Reviewed: H&P , NPO status , reviewed documented beta blocker date and time   Airway Mallampati: I   Neck ROM: Full    Dental  (+) Teeth Intact   Pulmonary    breath sounds clear to auscultation       Cardiovascular hypertension, Pt. on medications and Pt. on home beta blockers + dysrhythmias Atrial Fibrillation  Rhythm:Regular Rate:Normal + Systolic murmurs    Neuro/Psych    GI/Hepatic GERD  ,Barretts esophagus   Endo/Other    Renal/GU      Musculoskeletal   Abdominal   Peds  Hematology   Anesthesia Other Findings   Reproductive/Obstetrics                             Anesthesia Physical  Anesthesia Plan  ASA: III  Anesthesia Plan: MAC   Post-op Pain Management:    Induction: Intravenous  PONV Risk Score and Plan: 1 and Ondansetron  Airway Management Planned: Nasal Cannula  Additional Equipment:   Intra-op Plan:   Post-operative Plan:   Informed Consent: I have reviewed the patients History and Physical, chart, labs and discussed the procedure including the risks, benefits and alternatives for the proposed anesthesia with the patient or authorized representative who has indicated his/her understanding and acceptance.     Plan Discussed with: [name] and Surgeon  Anesthesia Plan Comments:         Anesthesia Quick Evaluation

## 2017-07-01 NOTE — Discharge Instructions (Signed)
TEE ° °YOU HAD AN CARDIAC PROCEDURE TODAY: Refer to the procedure report and other information in the discharge instructions given to you for any specific questions about what was found during the examination. If this information does not answer your questions, please call [facility] office at [phone] to clarify.  ° °DIET: Your first meal following the procedure should be a light meal and then it is ok to progress to your normal diet. A half-sandwich or bowl of soup is an example of a good first meal. Heavy or fried foods are harder to digest and may make you feel nauseous or bloated. Drink plenty of fluids but you should avoid alcoholic beverages for 24 hours. If you had a esophageal dilation, please see attached instructions for diet.  ° °ACTIVITY: Your care partner should take you home directly after the procedure. You should plan to take it easy, moving slowly for the rest of the day. You can resume normal activity the day after the procedure however YOU SHOULD NOT DRIVE, use power tools, machinery or perform tasks that involve climbing or major physical exertion for 24 hours (because of the sedation medicines used during the test).  ° °SYMPTOMS TO REPORT IMMEDIATELY: °A cardiologist can be reached at any hour. Please call [phone] for any of the following symptoms:  °Vomiting of blood or coffee ground material  °New, significant abdominal pain  °New, significant chest pain or pain under the shoulder blades  °Painful or persistently difficult swallowing  °New shortness of breath  °Black, tarry-looking or red, bloody stools ° °FOLLOW UP:  °Please also call with any specific questions about appointments or follow up tests. ° °Electrical Cardioversion, Care After °This sheet gives you information about how to care for yourself after your procedure. Your health care provider may also give you more specific instructions. If you have problems or questions, contact your health care provider. °What can I  expect after the procedure? °After the procedure, it is common to have: °· Some redness on the skin where the shocks were given. ° °Follow these instructions at home: °· Do not drive for 24 hours if you were given a medicine to help you relax (sedative). °· Take over-the-counter and prescription medicines only as told by your health care provider. °· Ask your health care provider how to check your pulse. Check it often. °· Rest for 48 hours after the procedure or as told by your health care provider. °· Avoid or limit your caffeine use as told by your health care provider. °Contact a health care provider if: °· You feel like your heart is beating too quickly or your pulse is not regular. °· You have a serious muscle cramp that does not go away. °Get help right away if: °· You have discomfort in your chest. °· You are dizzy or you feel faint. °· You have trouble breathing or you are short of breath. °· Your speech is slurred. °· You have trouble moving an arm or leg on one side of your body. °· Your fingers or toes turn cold or blue. °This information is not intended to replace advice given to you by your health care provider. Make sure you discuss any questions you have with your health care provider. °Document Released: 07/07/2013 Document Revised: 04/19/2016 Document Reviewed: 03/22/2016 °Elsevier Interactive Patient Education © 2018 Elsevier Inc. ° °

## 2017-07-01 NOTE — Anesthesia Preprocedure Evaluation (Signed)
Anesthesia Evaluation  Patient identified by MRN, date of birth, ID band Patient awake    Reviewed: Allergy & Precautions, NPO status , Patient's Chart, lab work & pertinent test results  Airway        Dental   Pulmonary           Cardiovascular hypertension,      Neuro/Psych    GI/Hepatic   Endo/Other    Renal/GU      Musculoskeletal   Abdominal   Peds  Hematology   Anesthesia Other Findings   Reproductive/Obstetrics                             Anesthesia Physical Anesthesia Plan Anesthesia Quick Evaluation  

## 2017-07-01 NOTE — Transfer of Care (Signed)
Immediate Anesthesia Transfer of Care Note  Patient: James [name]  Procedure(s) Performed: CARDIOVERSION (N/A ) TRANSESOPHAGEAL ECHOCARDIOGRAM (TEE) (N/A )  Patient Location: Endoscopy Unit  Anesthesia Type:MAC  Level of Consciousness: awake, alert  and oriented  Airway & Oxygen Therapy: Patient Spontanous Breathing and Patient connected to nasal cannula oxygen  Post-op Assessment: Report given to RN, Post -op Vital signs reviewed and stable and Patient moving all extremities X 4  Post vital signs: Reviewed and stable  Last Vitals:  Vitals:   07/01/17 0941 07/01/17 1126  BP: (!) 121/95   Pulse: (!) 144 64  Resp: 18 12  Temp: 36.8 C   SpO2: 98% 96%    Last Pain:  Vitals:   07/01/17 0941  TempSrc: Oral         Complications: No apparent anesthesia complications

## 2017-07-01 NOTE — Anesthesia Postprocedure Evaluation (Signed)
Anesthesia Post Note  Patient: James [name]  Procedure(s) Performed: CARDIOVERSION (N/A ) TRANSESOPHAGEAL ECHOCARDIOGRAM (TEE) (N/A )     Patient location during evaluation: PACU Anesthesia Type: MAC Level of consciousness: awake and alert Pain management: pain level controlled Vital Signs Assessment: post-procedure vital signs reviewed and stable Respiratory status: spontaneous breathing, nonlabored ventilation and respiratory function stable Cardiovascular status: stable and blood pressure returned to baseline Postop Assessment: no apparent nausea or vomiting Anesthetic complications: no    Last Vitals:  Vitals:   07/01/17 0941 07/01/17 1126  BP: (!) 121/95 (!) 83/57  Pulse: (!) 144 64  Resp: 18 12  Temp: 36.8 C (!) 36.3 C  SpO2: 98% 96%    Last Pain:  Vitals:   07/01/17 1126  TempSrc: Oral                 Lynda Rainwater

## 2017-07-01 NOTE — Progress Notes (Signed)
    Transesophageal Echocardiogram Note  Brandis Matsuura [id number] 16-May-1946  Procedure: Transesophageal Echocardiogram Indications: atrial flutter  Procedure Details Consent: Obtained Time Out: Verified patient identification, verified procedure, site/side was marked, verified correct patient position, special equipment/implants available, Radiology Safety Procedures followed,  medications/allergies/relevent history reviewed, required imaging and test results available.  Performed  Medications:  Pt sedated by anesthesia with diprovan 240 mg IV total.  Normal LV function; no LAA thrombus; full report to follow.  Pt subsequently had DCCV with 120 J to NSR; no immediate complications; continue apixaban.   Complications: No apparent complications Patient did tolerate procedure well.  Kirk Ruths, MD

## 2017-07-02 ENCOUNTER — Encounter (HOSPITAL_COMMUNITY): Payer: Self-pay | Admitting: Cardiology

## 2017-07-16 ENCOUNTER — Other Ambulatory Visit: Payer: Self-pay | Admitting: Cardiology

## 2017-07-21 ENCOUNTER — Encounter: Payer: Self-pay | Admitting: Internal Medicine

## 2017-07-21 ENCOUNTER — Ambulatory Visit (INDEPENDENT_AMBULATORY_CARE_PROVIDER_SITE_OTHER): Payer: Medicare Other | Admitting: Internal Medicine

## 2017-07-21 ENCOUNTER — Encounter (INDEPENDENT_AMBULATORY_CARE_PROVIDER_SITE_OTHER): Payer: Self-pay

## 2017-07-21 ENCOUNTER — Institutional Professional Consult (permissible substitution): Payer: Medicare Other | Admitting: Internal Medicine

## 2017-07-21 VITALS — BP 122/68 | HR 75 | Ht 71.0 in | Wt 186.0 lb

## 2017-07-21 DIAGNOSIS — Z9889 Other specified postprocedural states: Secondary | ICD-10-CM | POA: Diagnosis not present

## 2017-07-21 DIAGNOSIS — I4892 Unspecified atrial flutter: Secondary | ICD-10-CM

## 2017-07-21 NOTE — Patient Instructions (Addendum)
Medication Instructions:  Your physician recommends that you continue on your current medications as directed. Please refer to the Current Medication list given to you today.  -- If you need a refill on your cardiac medications before your next appointment, please call your pharmacy. --  Labwork: None ordered  Testing/Procedures: None ordered  Follow-Up: Your physician wants you to follow-up as needed with Dr. Rayann [name].  Please call if consider a flutter ablation.     Thank you for choosing [facility]!!   [phone]  Any Other Special Instructions Will Be Listed Below (If Applicable).   Cardiac Ablation for atrial flutter Cardiac ablation is a procedure to disable (ablate) a small amount of heart tissue in very specific places. The heart has many electrical connections. Sometimes these connections are abnormal and can cause the heart to beat very fast or irregularly. Ablating some of the problem areas can improve the heart rhythm or return it to normal. Ablation may be done for people who:  Have Wolff-Parkinson-White syndrome.  Have fast heart rhythms (tachycardia).  Have taken medicines for an abnormal heart rhythm (arrhythmia) that were not effective or caused side effects.  Have a high-risk heartbeat that may be life-threatening.  During the procedure, a small incision is made in the neck or the groin, and a long, thin, flexible tube (catheter) is inserted into the incision and moved to the heart. Small devices (electrodes) on the tip of the catheter will send out electrical currents. A type of X-ray (fluoroscopy) will be used to help guide the catheter and to provide images of the heart. Tell a health care provider about:  Any allergies you have.  All medicines you are taking, including vitamins, herbs, eye drops, creams, and over-the-counter medicines.  Any problems you or family members have had with anesthetic medicines.  Any blood disorders you have.  Any  surgeries you have had.  Any medical conditions you have, such as kidney failure.  Whether you are pregnant or may be pregnant. What are the risks? Generally, this is a safe procedure. However, problems may occur, including:  Infection.  Bruising and bleeding at the catheter insertion site.  Bleeding into the chest, especially into the sac that surrounds the heart. This is a serious complication.  Stroke or blood clots.  Damage to other structures or organs.  Allergic reaction to medicines or dyes.  Need for a permanent pacemaker if the normal electrical system is damaged. A pacemaker is a small computer that sends electrical signals to the heart and helps your heart beat normally.  The procedure not being fully effective. This may not be recognized until months later. Repeat ablation procedures are sometimes required.  What happens before the procedure?  Follow instructions from your health care provider about eating or drinking restrictions.  Ask your health care provider about: ? Changing or stopping your regular medicines. This is especially important if you are taking diabetes medicines or blood thinners. ? Taking medicines such as aspirin and ibuprofen. These medicines can thin your blood. Do not take these medicines before your procedure if your health care provider instructs you not to.  Plan to have someone take you home from the hospital or clinic.  If you will be going home right after the procedure, plan to have someone with you for 24 hours. What happens during the procedure?  To lower your risk of infection: ? Your health care team will wash or sanitize their hands. ? Your skin will be washed with soap. ?  Hair may be removed from the incision area.  An IV tube will be inserted into one of your veins.  You will be given a medicine to help you relax (sedative).  The skin on your neck or groin will be numbed.  An incision will be made in your neck or your  groin.  A needle will be inserted through the incision and into a large vein in your neck or groin.  A catheter will be inserted into the needle and moved to your heart.  Dye may be injected through the catheter to help your surgeon see the area of the heart that needs treatment.  Electrical currents will be sent from the catheter to ablate heart tissue in desired areas. There are three types of energy that may be used to ablate heart tissue: ? Heat (radiofrequency energy). ? Laser energy. ? Extreme cold (cryoablation).  When the necessary tissue has been ablated, the catheter will be removed.  Pressure will be held on the catheter insertion area to prevent excessive bleeding.  A bandage (dressing) will be placed over the catheter insertion area. The procedure may vary among health care providers and hospitals. What happens after the procedure?  Your blood pressure, heart rate, breathing rate, and blood oxygen level will be monitored until the medicines you were given have worn off.  Your catheter insertion area will be monitored for bleeding. You will need to lie still for a few hours to ensure that you do not bleed from the catheter insertion area.  Do not drive for 24 hours or as long as directed by your health care provider. Summary  Cardiac ablation is a procedure to disable (ablate) a small amount of heart tissue in very specific places. Ablating some of the problem areas can improve the heart rhythm or return it to normal.  During the procedure, electrical currents will be sent from the catheter to ablate heart tissue in desired areas. This information is not intended to replace advice given to you by your health care provider. Make sure you discuss any questions you have with your health care provider. Document Released: 02/02/2009 Document Revised: 08/05/2016 Document Reviewed: 08/05/2016 Elsevier Interactive Patient Education  [employer].

## 2017-07-21 NOTE — Progress Notes (Signed)
Electrophysiology Office Note   Date:  07/21/2017   ID:  James [name], DOB Feb 19, 1946, MRN [id number]  PCP:  Lavone Orn, MD  Cardiologist:  Dr Marlou Porch Primary Electrophysiologist:   Dr Caryl Comes  Chief Complaint  Patient presents with  . Atrial Fibrillation     History of Present Illness: James [name] is a 71 y.o. male who presents today for electrophysiology evaluation.   The patient presents for further discussions about atrial flutter management.  He has a h/o valvular heart disease and is s/p MVR 12/2012.  He had early post operative atrial fibrillation but no subsequent atrial arrhythmias until presenting to see Dr Caryl Comes in September.  He developed abrupt onset of tachypalpitations without clear etiology.  He was placed on eliquis and underwent cardioversion 07/01/17.  He has done well since, without any further symptoms of arrhythmia.  Today, he denies symptoms of palpitations, chest pain, shortness of breath, orthopnea, PND, lower extremity edema, claudication, dizziness, presyncope, syncope, bleeding, or neurologic sequela. The patient is tolerating medications without difficulties and is otherwise without complaint today.    Past Medical History:  Diagnosis Date  . Barrett esophagus 2002  . Erectile dysfunction   . GERD (gastroesophageal reflux disease)   . Hyperlipidemia   . Hypertension   . Pneumonia 2000   hospitalized  . S/P mitral valve repair 01/06/2013   Complex valvuloplasty including triangular resection of flail segment of posterior leaflet, artificial Gore-tex neocord placement x4 and 32 mm Sorin Memo 3D ring annuloplasty via right mini thoracotomy  . Severe mitral regurgitation    Past Surgical History:  Procedure Laterality Date  . CARDIAC CATHETERIZATION    . CARDIOVERSION N/A 07/01/2017   Procedure: CARDIOVERSION;  Surgeon: Lelon Perla, MD;  Location: [facility] ENDOSCOPY;  Service: Cardiovascular;  Laterality: N/A;  . COLONOSCOPY    . INTRAOPERATIVE  TRANSESOPHAGEAL ECHOCARDIOGRAM N/A 01/06/2013   Procedure: INTRAOPERATIVE TRANSESOPHAGEAL ECHOCARDIOGRAM;  Surgeon: Rexene Alberts, MD;  Location: [place];  Service: Open Heart Surgery;  Laterality: N/A;  . MITRAL VALVE REPAIR Right 01/06/2013   Procedure: MINIMALLY INVASIVE MITRAL VALVE REPAIR (MVR);  Surgeon: Rexene Alberts, MD;  Location: [place];  Service: Open Heart Surgery;  Laterality: Right;  . TEE WITHOUT CARDIOVERSION N/A 12/03/2012   Procedure: TRANSESOPHAGEAL ECHOCARDIOGRAM (TEE);  Surgeon: Candee Furbish, MD;  Location: [facility] ENDOSCOPY;  Service: Cardiovascular;  Laterality: N/A;  h/p in file cabinet-ja  . TEE WITHOUT CARDIOVERSION N/A 07/01/2017   Procedure: TRANSESOPHAGEAL ECHOCARDIOGRAM (TEE);  Surgeon: Lelon Perla, MD;  Location: [facility] ENDOSCOPY;  Service: Cardiovascular;  Laterality: N/A;     Current Outpatient Prescriptions  Medication Sig Dispense Refill  . apixaban (ELIQUIS) 5 MG TABS tablet Take 1 tablet (5 mg total) by mouth 2 (two) times daily. 60 tablet 11  . atorvastatin (LIPITOR) 20 MG tablet Take 1 tablet by mouth every evening.    . cetirizine (ZYRTEC) 10 MG tablet Take 10 mg by mouth daily as needed for allergies.     . Coenzyme Q10 (CO Q-10) 300 MG CAPS Take 1 capsule by mouth daily.    . fish oil-omega-3 fatty acids 1000 MG capsule Take 1 g by mouth daily.     . fluticasone (FLONASE) 50 MCG/ACT nasal spray Place 2 sprays into both nostrils daily as needed for allergies or rhinitis.     . metoprolol succinate (TOPROL-XL) 50 MG 24 hr tablet TAKE 1 TABLET BY MOUTH TWICE DAILY WITH OR IMMEDIATELY FOLLOWING A MEAL 180 tablet 3  . Multiple  Vitamin (MULTIVITAMIN WITH MINERALS) TABS Take 1 tablet by mouth daily.    Marland Kitchen omeprazole (PRILOSEC OTC) 20 MG tablet Take 20 mg by mouth daily.    . sildenafil (VIAGRA) 50 MG tablet Take 50 mg by mouth as needed for erectile dysfunction (take as directed).      No current facility-administered medications for this visit.     Allergies:    Iodine   Social History:  The patient  reports that he has never smoked. He has never used smokeless tobacco. He reports that he drinks about 12.6 oz of alcohol per week . He reports that he does not use drugs.   Family History:  The patient's  family history includes COPD in his mother; Heart failure in his mother; Hypertension in his mother.    ROS:  Please see the history of present illness.   All other systems are personally reviewed and negative.    PHYSICAL EXAM: VS:  BP 122/68   Pulse 75   Ht 5\' 11"  (1.803 m)   Wt 186 lb (84.4 kg)   SpO2 99%   BMI 25.94 kg/m  , BMI Body mass index is 25.94 kg/m. GEN: Well nourished, well developed, in no acute distress  HEENT: normal  Neck: no JVD, carotid bruits, or masses Cardiac: RRR; no murmurs, rubs, or gallops,no edema  Respiratory:  clear to auscultation bilaterally, normal work of breathing GI: soft, nontender, nondistended, + BS MS: no deformity or atrophy  Skin: warm and dry  Neuro:  Strength and sensation are intact Psych: euthymic mood, full affect  EKG:  EKG is ordered today. The ekg ordered today is personally reviewed and shows sinus rhythm 75 bpm, PR 202 msec, otherwise normal ekg   Recent Labs: 06/27/2017: BUN 15; Creatinine, Ser 0.95; Hemoglobin 14.7; Platelets 200; Potassium 4.8; Sodium 141  personally reviewed   Lipid Panel  No results found for: CHOL, TRIG, HDL, CHOLHDL, VLDL, LDLCALC, LDLDIRECT personally reviewed   Wt Readings from Last 3 Encounters:  07/21/17 186 lb (84.4 kg)  07/01/17 182 lb (82.6 kg)  06/27/17 187 lb (84.8 kg)      Other studies personally reviewed: Additional studies/ records that were reviewed today include: ekg 06/27/17 reveals likely typical atrial flutter,   TEE 07/01/17 reveals EF 55-60%, mild MR, severe LA enlargement   ASSESSMENT AND PLAN:  1.  Atrial flutter Typical appearing by ekg reviewed 06/27/17, though he has severe LA enlargement and prior valvular heart disease and  is at risks of other arrhythmias.  He has not had afib except early post operatively in 2014. Therapeutic strategies for atrial flutter including medicine and ablation were discussed in detail with the patient today. Risk, benefits, and alternatives to EP study and radiofrequency ablation were also discussed in detail today. These risks include but are not limited to stroke, bleeding, vascular damage, tamponade, perforation, damage to the heart and other structures, AV block requiring pacemaker, worsening renal function, and death. The patient understands these risk and wishes to discuss this further with his wife.  He will contact my office if he decides to proceed.  Would request carto, ICE, and anesthesia for the procedure. If he decides to not proceed, he will continue anticoagulation and follow-up with Dr Marlou Porch Lifestyle modification including ETOH avoidance discussed today   Current medicines are reviewed at length with the patient today.   The patient does not have concerns regarding his medicines.  The following changes were made today:  none    Signed,  Thompson Grayer, MD  07/21/2017 9:17 AM     [facility] [address] [place] [address] [phone] (office) [phone] (fax)

## 2017-07-22 ENCOUNTER — Telehealth: Payer: Self-pay | Admitting: Internal Medicine

## 2017-07-22 DIAGNOSIS — I483 Typical atrial flutter: Secondary | ICD-10-CM

## 2017-07-22 NOTE — Telephone Encounter (Signed)
Mr. James [name] is calling because he wanting to go ahead schedule the Apblation procedure . Please call

## 2017-07-23 NOTE — Telephone Encounter (Signed)
Follow up    Patient calling back to discuss ablation.

## 2017-07-23 NOTE — Telephone Encounter (Signed)
Spoke with patient who called to schedule his ablation. He states he has reviewed the information and has talked with his family and is ready to schedule. He is interested in scheduling for the end of November (last week). I advised that Dr. Jackalyn Lombard nurse will return to the office tomorrow (Wednesday) and will call him back to schedule. He verbalized understanding and thanked me for the call.

## 2017-07-23 NOTE — Telephone Encounter (Signed)
This phone note was entered 2 x

## 2017-07-23 NOTE — Telephone Encounter (Signed)
This is a duplicate.

## 2017-07-25 NOTE — Addendum Note (Signed)
Addended by: Frederik Schmidt on: 07/25/2017 11:18 AM   Modules accepted: Orders

## 2017-07-25 NOTE — Telephone Encounter (Addendum)
Returned call patient to discuss Aflutter ablation.  Scheduled for 11/27.  Please arrive at [facility] [place] [facility] at 5:30 am  Labs on 11/14 at 10:00 am.  Do Not have to fast. Do not eat or drink after midnight the night prior to the procedure Do not miss any doses of Eliquis up until morning of procedure Do not take any medications the morning of the test Plan for one night stay Will need someone to drive you home at discharge  Follow up with Dr Rayann Heman on 10/06/17 Please arrive 3:45pm  [address]

## 2017-08-05 ENCOUNTER — Telehealth: Payer: Self-pay | Admitting: Internal Medicine

## 2017-08-05 NOTE — Telephone Encounter (Signed)
Patient calling, states that he has a question that he would like to discuss with you.

## 2017-08-06 NOTE — Telephone Encounter (Signed)
LPM 11/7 md

## 2017-08-07 NOTE — Telephone Encounter (Signed)
Patient called to verify his out of pocket expenses for his ablation. Informed patient that prior to scheduling appointment the order is sent to our pre-cert department. Advised patient that the best way to know his out of pocket cost would be to contact his insurance company. Patient verbalized understanding and thanked me for the call.

## 2017-08-07 NOTE — Telephone Encounter (Signed)
New message     Pt has a few questions about his ablation that is schedule please call would like to speak with James [name]

## 2017-08-07 NOTE — Telephone Encounter (Signed)
Returned call patient to discuss Aflutter ablation.  Scheduled for 11/27.  Please arrive at [facility] [place] [facility] at 5:30 am  Labs on 11/14 at 10:00 am.  Do Not have to fast. Do not eat or drink after midnight the night prior to the procedure Do not miss any doses of Eliquis up until morning of procedure Do not take any medications the morning of the test Plan for one night stay Will need someone to drive you home at discharge  Follow up with Dr Rayann Heman on 10/06/17 Please arrive 3:45pm  [address] [place] [address]

## 2017-08-13 ENCOUNTER — Other Ambulatory Visit: Payer: Medicare Other | Admitting: *Deleted

## 2017-08-13 DIAGNOSIS — I483 Typical atrial flutter: Secondary | ICD-10-CM

## 2017-08-13 LAB — BASIC METABOLIC PANEL
BUN / CREAT RATIO: 13 (ref 10–24)
BUN: 14 mg/dL (ref 8–27)
CO2: 21 mmol/L (ref 20–29)
CREATININE: 1.07 mg/dL (ref 0.76–1.27)
Calcium: 9 mg/dL (ref 8.6–10.2)
Chloride: 105 mmol/L (ref 96–106)
GFR, EST AFRICAN AMERICAN: 80 mL/min/{1.73_m2} (ref >59)
GFR, EST NON AFRICAN AMERICAN: 69 mL/min/{1.73_m2} (ref >59)
Glucose: 108 mg/dL — ABNORMAL HIGH (ref 65–99)
Potassium: 4 mmol/L (ref 3.5–5.2)
SODIUM: 142 mmol/L (ref 134–144)

## 2017-08-13 LAB — CBC WITH DIFFERENTIAL/PLATELET
BASOS: 0 %
Basophils Absolute: 0 10*3/uL (ref 0.0–0.2)
EOS (ABSOLUTE): 0.1 10*3/uL (ref 0.0–0.4)
EOS: 1 %
HEMATOCRIT: 39.5 % (ref 37.5–51.0)
HEMOGLOBIN: 13.9 g/dL (ref 13.0–17.7)
Immature Grans (Abs): 0 10*3/uL (ref 0.0–0.1)
Immature Granulocytes: 0 %
LYMPHS ABS: 1.5 10*3/uL (ref 0.7–3.1)
Lymphs: 22 %
MCH: 33.4 pg — ABNORMAL HIGH (ref 26.6–33.0)
MCHC: 35.2 g/dL (ref 31.5–35.7)
MCV: 95 fL (ref 79–97)
MONOCYTES: 5 %
Monocytes Absolute: 0.3 10*3/uL (ref 0.1–0.9)
Neutrophils Absolute: 4.9 10*3/uL (ref 1.4–7.0)
Neutrophils: 72 %
Platelets: 214 10*3/uL (ref 150–379)
RBC: 4.16 x10E6/uL (ref 4.14–5.80)
RDW: 13.3 % (ref 12.3–15.4)
WBC: 6.9 10*3/uL (ref 3.4–10.8)

## 2017-08-18 ENCOUNTER — Other Ambulatory Visit: Payer: Self-pay | Admitting: *Deleted

## 2017-08-18 MED ORDER — APIXABAN 5 MG PO TABS: 5.0000 mg | ORAL_TABLET | Freq: Two times a day (BID) | ORAL | 5 refills | Status: DC

## 2017-08-26 ENCOUNTER — Ambulatory Visit (HOSPITAL_COMMUNITY): Payer: Medicare Other | Admitting: Certified Registered"

## 2017-08-26 ENCOUNTER — Encounter (HOSPITAL_COMMUNITY)
Admission: RE | Disposition: A | Discharge status: Home / Self Care | Payer: Self-pay | Source: Ambulatory Visit | Attending: Internal Medicine

## 2017-08-26 ENCOUNTER — Ambulatory Visit (HOSPITAL_COMMUNITY)
Admission: RE | Admit: 2017-08-26 | Discharge: 2017-08-26 | Disposition: A | Discharge status: Home / Self Care | Payer: Medicare Other | Source: Ambulatory Visit | Attending: Internal Medicine | Admitting: Internal Medicine

## 2017-08-26 DIAGNOSIS — I4891 Unspecified atrial fibrillation: Secondary | ICD-10-CM | POA: Diagnosis not present

## 2017-08-26 DIAGNOSIS — K219 Gastro-esophageal reflux disease without esophagitis: Secondary | ICD-10-CM | POA: Diagnosis not present

## 2017-08-26 DIAGNOSIS — I483 Typical atrial flutter: Secondary | ICD-10-CM | POA: Diagnosis not present

## 2017-08-26 DIAGNOSIS — Z8249 Family history of ischemic heart disease and other diseases of the circulatory system: Secondary | ICD-10-CM | POA: Insufficient documentation

## 2017-08-26 DIAGNOSIS — Z952 Presence of prosthetic heart valve: Secondary | ICD-10-CM | POA: Insufficient documentation

## 2017-08-26 DIAGNOSIS — E785 Hyperlipidemia, unspecified: Secondary | ICD-10-CM | POA: Diagnosis not present

## 2017-08-26 DIAGNOSIS — I10 Essential (primary) hypertension: Secondary | ICD-10-CM | POA: Diagnosis not present

## 2017-08-26 DIAGNOSIS — Z7901 Long term (current) use of anticoagulants: Secondary | ICD-10-CM | POA: Insufficient documentation

## 2017-08-26 DIAGNOSIS — Z7951 Long term (current) use of inhaled steroids: Secondary | ICD-10-CM | POA: Insufficient documentation

## 2017-08-26 HISTORY — PX: A-FLUTTER ABLATION: EP1230

## 2017-08-26 SURGERY — A-FLUTTER ABLATION
Anesthesia: General

## 2017-08-26 MED ORDER — BUPIVACAINE HCL (PF) 0.25 % IJ SOLN
INTRAMUSCULAR | Status: AC
  Filled 2017-08-26: qty 30

## 2017-08-26 MED ORDER — PROPOFOL 500 MG/50ML IV EMUL
INTRAVENOUS | Status: DC | PRN
  Administered 2017-08-26: 25 ug/kg/min via INTRAVENOUS

## 2017-08-26 MED ORDER — PROPOFOL 10 MG/ML IV BOLUS
INTRAVENOUS | Status: DC | PRN
  Administered 2017-08-26 (×3): 20 mg via INTRAVENOUS

## 2017-08-26 MED ORDER — HYDROCODONE-ACETAMINOPHEN 5-325 MG PO TABS: 1.0000 | ORAL_TABLET | ORAL | Status: DC | PRN

## 2017-08-26 MED ORDER — SODIUM CHLORIDE 0.9 % IV SOLN
INTRAVENOUS | Status: DC
  Administered 2017-08-26 (×2): via INTRAVENOUS

## 2017-08-26 MED ORDER — FENTANYL CITRATE (PF) 100 MCG/2ML IJ SOLN
INTRAMUSCULAR | Status: DC | PRN
  Administered 2017-08-26: 50 ug via INTRAVENOUS
  Administered 2017-08-26 (×2): 25 ug via INTRAVENOUS

## 2017-08-26 MED ORDER — PHENYLEPHRINE HCL 10 MG/ML IJ SOLN
INTRAMUSCULAR | Status: DC | PRN
  Administered 2017-08-26: 25 ug/min via INTRAVENOUS

## 2017-08-26 MED ORDER — MIDAZOLAM HCL 5 MG/5ML IJ SOLN
INTRAMUSCULAR | Status: DC | PRN
  Administered 2017-08-26 (×2): 1 mg via INTRAVENOUS

## 2017-08-26 MED ORDER — LIDOCAINE HCL (CARDIAC) 20 MG/ML IV SOLN
INTRAVENOUS | Status: DC | PRN
  Administered 2017-08-26: 40 mg via INTRAVENOUS

## 2017-08-26 MED ORDER — SODIUM CHLORIDE 0.9% FLUSH: 3.0000 mL | Freq: Two times a day (BID) | INTRAVENOUS | Status: DC

## 2017-08-26 MED ORDER — ONDANSETRON HCL 4 MG/2ML IJ SOLN
INTRAMUSCULAR | Status: DC | PRN
  Administered 2017-08-26: 4 mg via INTRAVENOUS

## 2017-08-26 MED ORDER — BUPIVACAINE HCL (PF) 0.25 % IJ SOLN
INTRAMUSCULAR | Status: DC | PRN
Start: 2017-08-26 — End: 2017-08-26
  Administered 2017-08-26: 30 mL

## 2017-08-26 MED ORDER — SODIUM CHLORIDE 0.9% FLUSH: 3.0000 mL | INTRAVENOUS | Status: DC | PRN

## 2017-08-26 MED ORDER — SODIUM CHLORIDE 0.9 % IV SOLN: 250.0000 mL | INTRAVENOUS | Status: DC | PRN

## 2017-08-26 SURGICAL SUPPLY — 11 items
BAG SNAP BAND KOVER 36X36 (MISCELLANEOUS) ×3 IMPLANT
BLANKET WARM UNDERBOD FULL ACC (MISCELLANEOUS) ×3 IMPLANT
CATH EZ STEER NAV 8MM F-J CUR (ABLATOR) ×3 IMPLANT
CATH JOSEPH QUAD ALLRED 6F REP (CATHETERS) ×3 IMPLANT
CATH WEBSTER BI DIR CS D-F CRV (CATHETERS) ×3 IMPLANT
PACK EP LATEX FREE (CUSTOM PROCEDURE TRAY) ×2
PACK EP LF (CUSTOM PROCEDURE TRAY) ×1 IMPLANT
PAD DEFIB LIFELINK (PAD) ×3 IMPLANT
PATCH CARTO3 (PAD) ×3 IMPLANT
SHEATH PINNACLE 7F 10CM (SHEATH) ×6 IMPLANT
SHEATH PINNACLE 8F 10CM (SHEATH) ×3 IMPLANT

## 2017-08-26 NOTE — H&P (Signed)
Cardiologist:  Dr Marlou Porch Primary Electrophysiologist:   Dr Caryl Comes     Chief Complaint  Patient presents with  . Atrial Fibrillation     History of Present Illness: James [name] is a 71 y.o. male who presents today for electrophysiology study and ablation for atrial flutter.  He has a h/o valvular heart disease and is s/p MVR 12/2012.  He had early post operative atrial fibrillation but no subsequent atrial arrhythmias until presenting to see Dr Caryl Comes in September.  He developed abrupt onset of tachypalpitations without clear etiology.  He was placed on eliquis and underwent cardioversion 07/01/17.  He has done well since, without any further symptoms of arrhythmia.  Today, he denies symptoms of palpitations, chest pain, shortness of breath, orthopnea, PND, lower extremity edema, claudication, dizziness, presyncope, syncope, bleeding, or neurologic sequela. The patient is tolerating medications without difficulties and is otherwise without complaint today.        Past Medical History:  Diagnosis Date  . Barrett esophagus 2002  . Erectile dysfunction   . GERD (gastroesophageal reflux disease)   . Hyperlipidemia   . Hypertension   . Pneumonia 2000   hospitalized  . S/P mitral valve repair 01/06/2013   Complex valvuloplasty including triangular resection of flail segment of posterior leaflet, artificial Gore-tex neocord placement x4 and 32 mm Sorin Memo 3D ring annuloplasty via right mini thoracotomy  . Severe mitral regurgitation         Past Surgical History:  Procedure Laterality Date  . CARDIAC CATHETERIZATION    . CARDIOVERSION N/A 07/01/2017   Procedure: CARDIOVERSION;  Surgeon: Lelon Perla, MD;  Location: [facility] ENDOSCOPY;  Service: Cardiovascular;  Laterality: N/A;  . COLONOSCOPY    . INTRAOPERATIVE TRANSESOPHAGEAL ECHOCARDIOGRAM N/A 01/06/2013   Procedure: INTRAOPERATIVE TRANSESOPHAGEAL ECHOCARDIOGRAM;  Surgeon: Rexene Alberts, MD;  Location: [place];   Service: Open Heart Surgery;  Laterality: N/A;  . MITRAL VALVE REPAIR Right 01/06/2013   Procedure: MINIMALLY INVASIVE MITRAL VALVE REPAIR (MVR);  Surgeon: Rexene Alberts, MD;  Location: [place];  Service: Open Heart Surgery;  Laterality: Right;  . TEE WITHOUT CARDIOVERSION N/A 12/03/2012   Procedure: TRANSESOPHAGEAL ECHOCARDIOGRAM (TEE);  Surgeon: Candee Furbish, MD;  Location: [facility] ENDOSCOPY;  Service: Cardiovascular;  Laterality: N/A;  h/p in file cabinet-ja  . TEE WITHOUT CARDIOVERSION N/A 07/01/2017   Procedure: TRANSESOPHAGEAL ECHOCARDIOGRAM (TEE);  Surgeon: Lelon Perla, MD;  Location: [facility] ENDOSCOPY;  Service: Cardiovascular;  Laterality: N/A;           Current Outpatient Prescriptions  Medication Sig Dispense Refill  . apixaban (ELIQUIS) 5 MG TABS tablet Take 1 tablet (5 mg total) by mouth 2 (two) times daily. 60 tablet 11  . atorvastatin (LIPITOR) 20 MG tablet Take 1 tablet by mouth every evening.    . cetirizine (ZYRTEC) 10 MG tablet Take 10 mg by mouth daily as needed for allergies.     . Coenzyme Q10 (CO Q-10) 300 MG CAPS Take 1 capsule by mouth daily.    . fish oil-omega-3 fatty acids 1000 MG capsule Take 1 g by mouth daily.     . fluticasone (FLONASE) 50 MCG/ACT nasal spray Place 2 sprays into both nostrils daily as needed for allergies or rhinitis.     . metoprolol succinate (TOPROL-XL) 50 MG 24 hr tablet TAKE 1 TABLET BY MOUTH TWICE DAILY WITH OR IMMEDIATELY FOLLOWING A MEAL 180 tablet 3  . Multiple Vitamin (MULTIVITAMIN WITH MINERALS) TABS Take 1 tablet by mouth daily.    Marland Kitchen  omeprazole (PRILOSEC OTC) 20 MG tablet Take 20 mg by mouth daily.    . sildenafil (VIAGRA) 50 MG tablet Take 50 mg by mouth as needed for erectile dysfunction (take as directed).      No current facility-administered medications for this visit.     Allergies:   Iodine   Social History:  The patient  reports that he has never smoked. He has never used smokeless tobacco. He reports  that he drinks about 12.6 oz of alcohol per week . He reports that he does not use drugs.   Family History:  The patient's  family history includes COPD in his mother; Heart failure in his mother; Hypertension in his mother.    ROS:  Please see the history of present illness.   All other systems are personally reviewed and negative.    PHYSICAL EXAM: Vitals:   08/26/17 0538  BP: (P) 138/81  Pulse: (P) 66  Resp: (P) 18  Temp: (P) 98.5 F (36.9 C)  SpO2: (P) 96%    GEN: Well nourished, well developed, in no acute distress  HEENT: normal  Neck: no JVD, carotid bruits, or masses Cardiac: RRR; no murmurs, rubs, or gallops,no edema  Respiratory:  clear to auscultation bilaterally, normal work of breathing GI: soft, nontender, nondistended, + BS MS: no deformity or atrophy  Skin: warm and dry  Neuro:  Strength and sensation are intact Psych: euthymic mood, full affect    Other studies personally reviewed: Additional studies/ records that were reviewed today include: ekg 06/27/17 reveals likely typical atrial flutter,   TEE 07/01/17 reveals EF 55-60%, mild MR, severe LA enlargement   ASSESSMENT AND PLAN:  1.  Atrial flutter Typical appearing by ekg reviewed 06/27/17, though he has severe LA enlargement and prior valvular heart disease and is at risks of other arrhythmias.  He has not had afib except early post operatively in 2014. Therapeutic strategies for atrial flutter including medicine and ablation were discussed in detail with the patient today. Risk, benefits, and alternatives to EP study and radiofrequency ablation were also discussed in detail today. These risks include but are not limited to stroke, bleeding, vascular damage, tamponade, perforation, damage to the heart and other structures, AV block requiring pacemaker, worsening renal function, and death. The patient understands these risk and wishes to proceed at this time.  Thompson Grayer MD, [facility] 08/26/2017 7:21  AM

## 2017-08-26 NOTE — Progress Notes (Signed)
Doing well post ablation. Routine groin care and follow up. Ok to discharge to home once bedrest complete.  Chanetta Marshall, NP 08/26/2017 2:20 PM

## 2017-08-26 NOTE — Transfer of Care (Signed)
Immediate Anesthesia Transfer of Care Note  Patient: James [name]  Procedure(s) Performed: A-FLUTTER ABLATION (N/A )  Patient Location: PACU and Cath Lab  Anesthesia Type:General  Level of Consciousness: awake, alert  and oriented  Airway & Oxygen Therapy: Patient connected to nasal cannula oxygen  Post-op Assessment: Post -op Vital signs reviewed and stable  Post vital signs: stable  Last Vitals:  Vitals:   08/26/17 0538  BP: (P) 138/81  Pulse: (P) 66  Resp: (P) 18  Temp: (P) 36.9 C  SpO2: (P) 96%    Last Pain:  Vitals:   08/26/17 0538  TempSrc: (P) Oral      Patients Stated Pain Goal: 3 (67/01/41 0301)  Complications: No apparent anesthesia complications

## 2017-08-26 NOTE — Anesthesia Postprocedure Evaluation (Signed)
Anesthesia Post Note  Patient: James [name]  Procedure(s) Performed: A-FLUTTER ABLATION (N/A )     Patient location during evaluation: PACU Anesthesia Type: General Level of consciousness: awake and alert Pain management: pain level controlled Vital Signs Assessment: post-procedure vital signs reviewed and stable Respiratory status: spontaneous breathing, nonlabored ventilation, respiratory function stable and patient connected to nasal cannula oxygen Cardiovascular status: blood pressure returned to baseline and stable Postop Assessment: no apparent nausea or vomiting Anesthetic complications: no Comments: Started as a MAC case but converted to general for patient movement    Last Vitals:  Vitals:   08/26/17 1230 08/26/17 1300  BP: 135/70 (!) 147/81  Pulse: (!) 56 (!) 57  Resp: 17 (!) 8  Temp:    SpO2: 96% 99%    Last Pain:  Vitals:   08/26/17 0943  TempSrc: Temporal                 Montez Hageman

## 2017-08-26 NOTE — Anesthesia Procedure Notes (Signed)
Procedure Name: LMA Insertion Date/Time: 08/26/2017 8:51 AM Performed by: Lavell Luster, [name] Pre-anesthesia Checklist: Patient identified, Emergency Drugs available, Suction available, Timeout performed and Patient being monitored Patient Re-evaluated:Patient Re-evaluated prior to induction Oxygen Delivery Method: Circle system utilized Preoxygenation: Pre-oxygenation with 100% oxygen Induction Type: IV induction Ventilation: Mask ventilation without difficulty LMA: LMA inserted LMA Size: 4.0 Number of attempts: 1 Placement Confirmation: positive ETCO2 and breath sounds checked- equal and bilateral Dental Injury: Teeth and Oropharynx as per pre-operative assessment

## 2017-08-26 NOTE — Anesthesia Preprocedure Evaluation (Addendum)
Anesthesia Evaluation  Patient identified by MRN, date of birth, ID band Patient awake    Reviewed: H&P , NPO status , reviewed documented beta blocker date and time   Airway Mallampati: I   Neck ROM: Full    Dental  (+) Teeth Intact   Pulmonary    breath sounds clear to auscultation       Cardiovascular hypertension, Pt. on medications and Pt. on home beta blockers + dysrhythmias Atrial Fibrillation  Rhythm:Regular Rate:Normal + Systolic murmurs    Neuro/Psych    GI/Hepatic GERD  ,(+)     substance abuse  alcohol use, Barretts esophagus   Endo/Other    Renal/GU      Musculoskeletal   Abdominal   Peds  Hematology   Anesthesia Other Findings   Reproductive/Obstetrics                            Anesthesia Physical  Anesthesia Plan  ASA: III  Anesthesia Plan: MAC   Post-op Pain Management:    Induction: Intravenous  PONV Risk Score and Plan: 1 and Ondansetron  Airway Management Planned: Nasal Cannula  Additional Equipment:   Intra-op Plan:   Post-operative Plan:   Informed Consent: I have reviewed the patients History and Physical, chart, labs and discussed the procedure including the risks, benefits and alternatives for the proposed anesthesia with the patient or authorized representative who has indicated his/her understanding and acceptance.     Plan Discussed with: [name] and Surgeon  Anesthesia Plan Comments:         Anesthesia Quick Evaluation

## 2017-08-26 NOTE — Discharge Instructions (Signed)
No driving for 3 days. No lifting over 5 lbs for 1 week. No sexual activity for 1 week. You may return to work in 1 week. Keep procedure site clean & dry. If you notice increased pain, swelling, bleeding or pus, call/return!  You may shower, but no soaking baths/hot tubs/pools for 1 week.     Femoral Site Care Refer to this sheet in the next few weeks. These instructions provide you with information about caring for yourself after your procedure. Your health care provider may also give you more specific instructions. Your treatment has been planned according to current medical practices, but problems sometimes occur. Call your health care provider if you have any problems or questions after your procedure. What can I expect after the procedure? After your procedure, it is typical to have the following:  Bruising at the site that usually fades within 1-2 weeks.  Blood collecting in the tissue (hematoma) that may be painful to the touch. It should usually decrease in size and tenderness within 1-2 weeks.  Follow these instructions at home:  Take medicines only as directed by your health care provider.  You may shower 24-48 hours after the procedure or as directed by your health care provider. Remove the bandage (dressing) and gently wash the site with plain soap and water. Pat the area dry with a clean towel. Do not rub the site, because this may cause bleeding.  Do not take baths, swim, or use a hot tub until your health care provider approves.  Check your insertion site every day for redness, swelling, or drainage.  Do not apply powder or lotion to the site.  Limit use of stairs to twice a day for the first 2-3 days or as directed by your health care provider.  Do not squat for the first 2-3 days or as directed by your health care provider.  Do not lift over 10 lb (4.5 kg) for 5 days after your procedure or as directed by your health care provider.  Ask your health care provider when  it is okay to: ? Return to work or school. ? Resume usual physical activities or sports. ? Resume sexual activity.  Do not drive home if you are discharged the same day as the procedure. Have someone else drive you.  You may drive 24 hours after the procedure unless otherwise instructed by your health care provider.  Do not operate machinery or power tools for 24 hours after the procedure or as directed by your health care provider.  If your procedure was done as an outpatient procedure, which means that you went home the same day as your procedure, a responsible adult should be with you for the first 24 hours after you arrive home.  Keep all follow-up visits as directed by your health care provider. This is important. Contact a health care provider if:  You have a fever.  You have chills.  You have increased bleeding from the site. Hold pressure on the site. Get help right away if:  You have unusual pain at the site.  You have redness, warmth, or swelling at the site.  You have drainage (other than a small amount of blood on the dressing) from the site.  The site is bleeding, and the bleeding does not stop after 30 minutes of holding steady pressure on the site.  Your leg or foot becomes pale, cool, tingly, or numb. This information is not intended to replace advice given to you by your health care  provider. Make sure you discuss any questions you have with your health care provider. Document Released: 05/20/2014 Document Revised: 02/22/2016 Document Reviewed: 04/05/2014 Elsevier Interactive Patient Education  2018 [employer].    Moderate Conscious Sedation, Adult, Care After These instructions provide you with information about caring for yourself after your procedure. Your health care provider may also give you more specific instructions. Your treatment has been planned according to current medical practices, but problems sometimes occur. Call your health care provider if  you have any problems or questions after your procedure. What can I expect after the procedure? After your procedure, it is common:  To feel sleepy for several hours.  To feel clumsy and have poor balance for several hours.  To have poor judgment for several hours.  To vomit if you eat too soon.  Follow these instructions at home: For at least 24 hours after the procedure:   Do not: ? Participate in activities where you could fall or become injured. ? Drive. ? Use heavy machinery. ? Drink alcohol. ? Take sleeping pills or medicines that cause drowsiness. ? Make important decisions or sign legal documents. ? Take care of children on your own.  Rest. Eating and drinking  Follow the diet recommended by your health care provider.  If you vomit: ? Drink water, juice, or soup when you can drink without vomiting. ? Make sure you have little or no nausea before eating solid foods. General instructions  Have a responsible adult stay with you until you are awake and alert.  Take over-the-counter and prescription medicines only as told by your health care provider.  If you smoke, do not smoke without supervision.  Keep all follow-up visits as told by your health care provider. This is important. Contact a health care provider if:  You keep feeling nauseous or you keep vomiting.  You feel light-headed.  You develop a rash.  You have a fever. Get help right away if:  You have trouble breathing. This information is not intended to replace advice given to you by your health care provider. Make sure you discuss any questions you have with your health care provider. Document Released: 07/07/2013 Document Revised: 02/19/2016 Document Reviewed: 01/06/2016 Elsevier Interactive Patient Education  [employer].

## 2017-08-26 NOTE — Anesthesia Procedure Notes (Addendum)
Procedure Name: MAC Date/Time: 08/26/2017 7:57 AM Performed by: Lavell Luster, [name] Pre-anesthesia Checklist: Patient identified, Emergency Drugs available, Suction available, Patient being monitored and Timeout performed Patient Re-evaluated:Patient Re-evaluated prior to induction Oxygen Delivery Method: Circle system utilized Preoxygenation: Pre-oxygenation with 100% oxygen Induction Type: IV induction Placement Confirmation: positive ETCO2 Dental Injury: Teeth and Oropharynx as per pre-operative assessment

## 2017-08-26 NOTE — Progress Notes (Addendum)
Site area: RFV x 3  Site Prior to Removal:  Level 0 Pressure Applied For: 20 min Manual:  yes  Patient Status During Pull:  stable Post Pull Site:  Level 0 Post Pull Instructions Given: yes  Post Pull Pulses Present: palpable Dressing Applied:  tegaderm Bedrest begins @ 1020 till 1620 Comments:

## 2017-08-26 NOTE — Progress Notes (Signed)
Assumed care of pt from Debbie Hedge, RN.  Assessment documented. 

## 2017-08-27 ENCOUNTER — Encounter (HOSPITAL_COMMUNITY): Payer: Self-pay | Admitting: Internal Medicine

## 2017-10-06 ENCOUNTER — Encounter (INDEPENDENT_AMBULATORY_CARE_PROVIDER_SITE_OTHER): Payer: Self-pay

## 2017-10-06 ENCOUNTER — Ambulatory Visit: Payer: Medicare Other | Admitting: Internal Medicine

## 2017-10-06 ENCOUNTER — Encounter: Payer: Self-pay | Admitting: Internal Medicine

## 2017-10-06 VITALS — BP 110/74 | HR 71 | Ht 71.0 in | Wt 189.2 lb

## 2017-10-06 DIAGNOSIS — Z9889 Other specified postprocedural states: Secondary | ICD-10-CM | POA: Diagnosis not present

## 2017-10-06 DIAGNOSIS — Z8679 Personal history of other diseases of the circulatory system: Secondary | ICD-10-CM | POA: Diagnosis not present

## 2017-10-06 DIAGNOSIS — I483 Typical atrial flutter: Secondary | ICD-10-CM

## 2017-10-06 MED ORDER — ASPIRIN EC 81 MG PO TBEC: 81.0000 mg | DELAYED_RELEASE_TABLET | Freq: Every day | ORAL | 3 refills | Status: DC

## 2017-10-06 NOTE — Patient Instructions (Addendum)
Medication Instructions:  Your physician has recommended you make the following change in your medication:  1. STOP Eliquis 2. START Aspirin 81 mg daily  * If you need a refill on your cardiac medications before your next appointment, please call your pharmacy. *  Labwork: None ordered  Testing/Procedures: None ordered  Follow-Up: No follow up is needed at this time with Dr. Rayann Heman.  He will see you on an as needed basis.  Thank you for choosing [facility]!!

## 2017-10-06 NOTE — Progress Notes (Signed)
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  Since his recent atrial flutter ablation, the patient reports doing very well.  he denies procedure related complications and is pleased with the results of the procedure.  Today, he denies symptoms of palpitations, chest pain, shortness of breath,  lower extremity edema, dizziness, presyncope, or syncope.  The patient is otherwise without complaint today.   Past Medical History:  Diagnosis Date  . Barrett esophagus 2002  . Erectile dysfunction   . GERD (gastroesophageal reflux disease)   . Hyperlipidemia   . Hypertension   . Pneumonia 2000   hospitalized  . S/P mitral valve repair 01/06/2013   Complex valvuloplasty including triangular resection of flail segment of posterior leaflet, artificial Gore-tex neocord placement x4 and 32 mm Sorin Memo 3D ring annuloplasty via right mini thoracotomy  . Severe mitral regurgitation    Past Surgical History:  Procedure Laterality Date  . A-FLUTTER ABLATION N/A 08/26/2017   Procedure: A-FLUTTER ABLATION;  Surgeon: James Grayer, MD;  Location: [place] CV LAB;  Service: Cardiovascular;  Laterality: N/A;  . CARDIAC CATHETERIZATION    . CARDIOVERSION N/A 07/01/2017   Procedure: CARDIOVERSION;  Surgeon: James Perla, MD;  Location: [facility] ENDOSCOPY;  Service: Cardiovascular;  Laterality: N/A;  . COLONOSCOPY    . INTRAOPERATIVE TRANSESOPHAGEAL ECHOCARDIOGRAM N/A 01/06/2013   Procedure: INTRAOPERATIVE TRANSESOPHAGEAL ECHOCARDIOGRAM;  Surgeon: James Alberts, MD;  Location: [place];  Service: Open Heart Surgery;  Laterality: N/A;  . MITRAL VALVE REPAIR Right 01/06/2013   Procedure: MINIMALLY INVASIVE MITRAL VALVE REPAIR (MVR);  Surgeon: James Alberts, MD;  Location: [place];  Service: Open Heart Surgery;  Laterality: Right;  . TEE WITHOUT CARDIOVERSION N/A 12/03/2012   Procedure:  TRANSESOPHAGEAL ECHOCARDIOGRAM (TEE);  Surgeon: James Furbish, MD;  Location: [facility] ENDOSCOPY;  Service: Cardiovascular;  Laterality: N/A;  h/p in file cabinet-ja  . TEE WITHOUT CARDIOVERSION N/A 07/01/2017   Procedure: TRANSESOPHAGEAL ECHOCARDIOGRAM (TEE);  Surgeon: James Perla, MD;  Location: [facility] ENDOSCOPY;  Service: Cardiovascular;  Laterality: N/A;    ROS- all systems are personally reviewed and negatives except as per HPI above  Current Outpatient Medications  Medication Sig Dispense Refill  . apixaban (ELIQUIS) 5 MG TABS tablet Take 1 tablet (5 mg total) 2 (two) times daily by mouth. 60 tablet 5  . atorvastatin (LIPITOR) 20 MG tablet Take 20 mg every evening by mouth.     . Coenzyme Q10 (CO Q-10) 300 MG CAPS Take 300 mg daily by mouth.     . fish oil-omega-3 fatty acids 1000 MG capsule Take 1 g by mouth daily.     . fluticasone (FLONASE) 50 MCG/ACT nasal spray Place 2 sprays into both nostrils daily as needed for allergies or rhinitis.     . metoprolol succinate (TOPROL-XL) 50 MG 24 hr tablet TAKE 1 TABLET BY MOUTH TWICE DAILY WITH OR IMMEDIATELY FOLLOWING A MEAL 180 tablet 3  . Multiple Vitamin (MULTIVITAMIN WITH MINERALS) TABS Take 1 tablet by mouth daily.    Marland Kitchen omeprazole (PRILOSEC OTC) 20 MG tablet Take 20 mg every evening by mouth.     . Propylene Glycol-Glycerin (MOISTURE EYES OP) Apply 1 drop daily as needed to eye (dry eyes).    . sildenafil (VIAGRA) 50 MG tablet Take 50 mg by mouth as needed for erectile dysfunction (take as directed).      No current facility-administered medications  for this visit.     Physical Exam: Vitals:   10/06/17 1545  BP: 110/74  Pulse: 71  Weight: 189 lb 3.2 oz (85.8 kg)  Height: 5\' 11"  (1.803 m)    GEN- The patient is well appearing, alert and oriented x 3 today.   Head- normocephalic, atraumatic Eyes-  Sclera clear, conjunctiva pink Ears- hearing intact Oropharynx- clear Lungs- Clear to ausculation bilaterally, normal work of  breathing Heart- Regular rate and rhythm, no murmurs, rubs or gallops, PMI not laterally displaced GI- soft, NT, ND, + BS Extremities- no clubbing, cyanosis, or edema  EKG tracing ordered today is personally reviewed and shows sinus rhythm 71 bpm, PR 202 msec, LAD  Assessment and Plan:  1. Atrial flutter Doing well s/p ablation No afib documented, though the patient has prior mitral valve surgery and severe LA enlargement and is certainly at risk for afib.  chads2vasc score is 3. We discussed long term monitoring with an implantable loop recorder today.  I have advised ILR to see if he has atrial arrhythmias going forward.  He understands my recommendation but would currently prefer to stop eliquis, start ASA 81mg  daily and follow conservatively.  He would like to "monitor (my own) pulse" and "let us know" if he has arrhythmias going forward.  I have explained that there is some increase in stroke risk with approach but will do as he requests.  Stop eliquis Start ASA  Follow-up with [name] as scheduled I will see as needed going forward  James Grayer MD, James [name] 10/06/2017 4:01 PM

## 2017-10-07 ENCOUNTER — Other Ambulatory Visit: Payer: Self-pay | Admitting: Internal Medicine

## 2017-10-07 DIAGNOSIS — K862 Cyst of pancreas: Secondary | ICD-10-CM

## 2017-10-31 ENCOUNTER — Ambulatory Visit
Admission: RE | Admit: 2017-10-31 | Discharge: 2017-10-31 | Disposition: A | Discharge status: Home / Self Care | Payer: Medicare Other | Source: Ambulatory Visit | Attending: Internal Medicine | Admitting: Internal Medicine

## 2017-10-31 DIAGNOSIS — K862 Cyst of pancreas: Secondary | ICD-10-CM

## 2017-11-04 ENCOUNTER — Other Ambulatory Visit: Payer: Medicare Other

## 2017-11-11 ENCOUNTER — Ambulatory Visit
Admission: RE | Admit: 2017-11-11 | Discharge: 2017-11-11 | Disposition: A | Discharge status: Home / Self Care | Payer: Medicare Other | Source: Ambulatory Visit | Attending: Internal Medicine | Admitting: Internal Medicine

## 2017-11-11 ENCOUNTER — Other Ambulatory Visit: Payer: Self-pay | Admitting: Internal Medicine

## 2017-11-11 DIAGNOSIS — K862 Cyst of pancreas: Secondary | ICD-10-CM

## 2017-11-11 MED ORDER — IOPAMIDOL (ISOVUE-300) INJECTION 61%: 100.0000 mL | Freq: Once | INTRAVENOUS | Status: DC | PRN

## 2017-11-11 MED ORDER — IOPAMIDOL (ISOVUE-300) INJECTION 61%
125.0000 mL | Freq: Once | INTRAVENOUS | Status: AC | PRN
  Administered 2017-11-11: 125 mL via INTRAVENOUS

## 2018-04-24 ENCOUNTER — Other Ambulatory Visit: Payer: Self-pay | Admitting: Cardiology

## 2018-07-24 ENCOUNTER — Other Ambulatory Visit: Payer: Self-pay | Admitting: Cardiology

## 2018-08-11 ENCOUNTER — Encounter: Payer: Self-pay | Admitting: Cardiology

## 2018-09-01 ENCOUNTER — Ambulatory Visit: Payer: Medicare Other | Admitting: Cardiology

## 2018-09-01 ENCOUNTER — Encounter: Payer: Self-pay | Admitting: Cardiology

## 2018-09-01 VITALS — BP 116/76 | HR 71 | Ht 71.0 in | Wt 184.1 lb

## 2018-09-01 DIAGNOSIS — Z8679 Personal history of other diseases of the circulatory system: Secondary | ICD-10-CM

## 2018-09-01 DIAGNOSIS — Z9889 Other specified postprocedural states: Secondary | ICD-10-CM | POA: Diagnosis not present

## 2018-09-01 DIAGNOSIS — I483 Typical atrial flutter: Secondary | ICD-10-CM

## 2018-09-01 NOTE — Progress Notes (Signed)
Cardiology Office Note:    Date:  09/01/2018   ID:  James [name], DOB 05-Apr-1946, MRN [id number]  PCP:  Lavone Orn, MD  Cardiologist:  Candee Furbish, MD  Electrophysiologist:  None   Referring MD: Lavone Orn, MD     History of Present Illness:    James [name] is a 72 y.o. male here for follow-up of atrial flutter.  Had ablation done Dr. Rayann Heman.  Doing well.  Prior mitral valve repair in 2014 by Dr. Roxy Manns.  There was no documented atrial fibrillation or flutter post ablation.  Dr. Rayann Heman advised implantable loop recorder to see if he has any atrial arrhythmias going forward.  He would prefer to stop Eliquis and start aspirin.  He will let us know.  Understands that he may have an increased stroke risk.  Past Medical History:  Diagnosis Date  . Barrett esophagus 2002  . Erectile dysfunction   . GERD (gastroesophageal reflux disease)   . Hyperlipidemia   . Hypertension   . Pneumonia 2000   hospitalized  . S/P mitral valve repair 01/06/2013   Complex valvuloplasty including triangular resection of flail segment of posterior leaflet, artificial Gore-tex neocord placement x4 and 32 mm Sorin Memo 3D ring annuloplasty via right mini thoracotomy  . Severe mitral regurgitation     Past Surgical History:  Procedure Laterality Date  . A-FLUTTER ABLATION N/A 08/26/2017   Procedure: A-FLUTTER ABLATION;  Surgeon: Thompson Grayer, MD;  Location: [place] [facility];  Service: Cardiovascular;  Laterality: N/A;  . CARDIAC CATHETERIZATION    . CARDIOVERSION N/A 07/01/2017   Procedure: CARDIOVERSION;  Surgeon: Lelon Perla, MD;  Location: [facility] ENDOSCOPY;  Service: Cardiovascular;  Laterality: N/A;  . COLONOSCOPY    . INTRAOPERATIVE TRANSESOPHAGEAL ECHOCARDIOGRAM N/A 01/06/2013   Procedure: INTRAOPERATIVE TRANSESOPHAGEAL ECHOCARDIOGRAM;  Surgeon: Rexene Alberts, MD;  Location: [place];  Service: Open Heart Surgery;  Laterality: N/A;  . MITRAL VALVE REPAIR Right 01/06/2013   Procedure: MINIMALLY  INVASIVE MITRAL VALVE REPAIR (MVR);  Surgeon: Rexene Alberts, MD;  Location: [place];  Service: Open Heart Surgery;  Laterality: Right;  . TEE WITHOUT CARDIOVERSION N/A 12/03/2012   Procedure: TRANSESOPHAGEAL ECHOCARDIOGRAM (TEE);  Surgeon: Candee Furbish, MD;  Location: [facility] ENDOSCOPY;  Service: Cardiovascular;  Laterality: N/A;  h/p in file cabinet-ja  . TEE WITHOUT CARDIOVERSION N/A 07/01/2017   Procedure: TRANSESOPHAGEAL ECHOCARDIOGRAM (TEE);  Surgeon: Lelon Perla, MD;  Location: [facility] ENDOSCOPY;  Service: Cardiovascular;  Laterality: N/A;    Current Medications: Current Meds  Medication Sig  . aspirin EC 81 MG tablet Take 1 tablet (81 mg total) by mouth daily.  Marland Kitchen atorvastatin (LIPITOR) 20 MG tablet Take 20 mg every evening by mouth.   . Coenzyme Q10 (CO Q-10) 300 MG CAPS Take 300 mg daily by mouth.   . fish oil-omega-3 fatty acids 1000 MG capsule Take 1 g by mouth daily.   . fluticasone (FLONASE) 50 MCG/ACT nasal spray Place 2 sprays into both nostrils daily as needed for allergies or rhinitis.   . metoprolol succinate (TOPROL-XL) 50 MG 24 hr tablet Take 1 tablet (50 mg total) by mouth 2 (two) times daily after a meal. Please make annual appt for future refills. [phone]  . Multiple Vitamin (MULTIVITAMIN WITH MINERALS) TABS Take 1 tablet by mouth daily.  Marland Kitchen omeprazole (PRILOSEC OTC) 20 MG tablet Take 20 mg every evening by mouth.   . Propylene Glycol-Glycerin (MOISTURE EYES OP) Apply 1 drop daily as needed to eye (dry eyes).  Marland Kitchen  sildenafil (VIAGRA) 50 MG tablet Take 50 mg by mouth as needed for erectile dysfunction (take as directed).      Allergies:   Iodinated diagnostic agents   Social History   Socioeconomic History  . Marital status: Married    Spouse name: Not on file  . Number of children: 3  . Years of education: Not on file  . Highest education level: Not on file  Occupational History  . Occupation: retired    Comment: Teacher, English as a foreign language  Social Needs  . Financial  resource strain: Not on file  . Food insecurity:    Worry: Not on file    Inability: Not on file  . Transportation needs:    Medical: Not on file    Non-medical: Not on file  Tobacco Use  . Smoking status: Never Smoker  . Smokeless tobacco: Never Used  Substance and Sexual Activity  . Alcohol use: Yes    Alcohol/week: 21.0 standard drinks    Types: 7 Glasses of wine, 14 Cans of beer per week  . Drug use: No  . Sexual activity: Not on file  Lifestyle  . Physical activity:    Days per week: Not on file    Minutes per session: Not on file  . Stress: Not on file  Relationships  . Social connections:    Talks on phone: Not on file    Gets together: Not on file    Attends religious service: Not on file    Active member of club or organization: Not on file    Attends meetings of clubs or organizations: Not on file    Relationship status: Not on file  Other Topics Concern  . Not on file  Social History Narrative  . Not on file     Family History: The patient's family history includes COPD in his mother; Heart failure in his mother; Hypertension in his mother.  ROS:   Please see the history of present illness.    Denies any fevers chills nausea vomiting syncope all other systems reviewed and are negative.  EKGs/Labs/Other Studies Reviewed:    The following studies were reviewed today: Prior office notes EKG lab work  EKG:  EKG is  ordered today.  The ekg ordered today demonstrates sinus rhythm 71 with no other significant abnormalities personally reviewed and interpreted  Recent Labs: No results found for requested labs within last 8760 hours.  Recent Lipid Panel No results found for: CHOL, TRIG, HDL, CHOLHDL, VLDL, LDLCALC, LDLDIRECT  Physical Exam:    VS:  BP 116/76   Pulse 71   Ht 5\' 11"  (1.803 m)   Wt 184 lb 1.9 oz (83.5 kg)   BMI 25.68 kg/m     Wt Readings from Last 3 Encounters:  09/01/18 184 lb 1.9 oz (83.5 kg)  10/06/17 189 lb 3.2 oz (85.8 kg)    08/26/17 (P) 185 lb (83.9 kg)     GEN:  Well nourished, well developed in no acute distress HEENT: Normal NECK: No JVD; No carotid bruits LYMPHATICS: No lymphadenopathy CARDIAC: RRR, no murmurs, rubs, gallops RESPIRATORY:  Clear to auscultation without rales, wheezing or rhonchi  ABDOMEN: Soft, non-tender, non-distended MUSCULOSKELETAL:  No edema; No deformity  SKIN: Warm and dry NEUROLOGIC:  Alert and oriented x 3 PSYCHIATRIC:  Normal affect   ASSESSMENT:    1. Typical atrial flutter ([place])   2. S/P ablation of atrial flutter   3. S/P mitral valve repair    PLAN:  In order of problems listed above:  Atrial flutter -Status post ablation.  Currently on aspirin.  Has not had any further recurrence.  Understands that this may occur in the future and does put him at a increased stroke risk if he is in atrial fibrillation without anticoagulation.  Mitral valve repair -Continue with dental antibiotics. Dental abx.  Overall doing well.   Medication Adjustments/Labs and Tests Ordered: Current medicines are reviewed at length with the patient today.  Concerns regarding medicines are outlined above.  Orders Placed This Encounter  Procedures  . EKG 12-Lead   No orders of the defined types were placed in this encounter.   Patient Instructions  Medication Instructions:  The current medical regimen is effective;  continue present plan and medications.  If you need a refill on your cardiac medications before your next appointment, please call your pharmacy.   Follow-Up: At [facility], you and your health needs are our priority.  As part of our continuing mission to provide you with exceptional heart care, we have created designated Provider Care Teams.  These Care Teams include your primary Cardiologist (physician) and Advanced Practice Providers (APPs -  Physician Assistants and Nurse Practitioners) who all work together to provide you with the care you need, when you need  it. You will need a follow up appointment in 12 months.  Please call our office 2 months in advance to schedule this appointment.  You may see Candee Furbish, MD or one of the following Advanced Practice Providers on your designated Care Team:   Truitt Merle, NP Cecilie Kicks, NP . Kathyrn Drown, NP  Thank you for choosing [facility]!!         Signed, Candee Furbish, MD  09/01/2018 4:37 PM    [place]

## 2018-09-01 NOTE — Patient Instructions (Signed)
Medication Instructions:  The current medical regimen is effective;  continue present plan and medications.  If you need a refill on your cardiac medications before your next appointment, please call your pharmacy.   Follow-Up: At [facility], you and your health needs are our priority.  As part of our continuing mission to provide you with exceptional heart care, we have created designated Provider Care Teams.  These Care Teams include your primary Cardiologist (physician) and Advanced Practice Providers (APPs -  Physician Assistants and Nurse Practitioners) who all work together to provide you with the care you need, when you need it. You will need a follow up appointment in 12 months.  Please call our office 2 months in advance to schedule this appointment.  You may see Mark Skains, MD or one of the following Advanced Practice Providers on your designated Care Team:   Lori Gerhardt, NP Laura Ingold, NP . Jill McDaniel, NP  Thank you for choosing [facility]!!      

## 2018-11-27 ENCOUNTER — Other Ambulatory Visit: Payer: Self-pay | Admitting: Cardiology

## 2019-09-02 ENCOUNTER — Ambulatory Visit: Payer: Medicare Other | Admitting: Cardiology

## 2019-09-02 ENCOUNTER — Other Ambulatory Visit: Payer: Self-pay

## 2019-09-02 ENCOUNTER — Encounter: Payer: Self-pay | Admitting: Cardiology

## 2019-09-02 VITALS — BP 112/80 | HR 86 | Ht 71.0 in | Wt 186.0 lb

## 2019-09-02 DIAGNOSIS — Z9889 Other specified postprocedural states: Secondary | ICD-10-CM | POA: Diagnosis not present

## 2019-09-02 DIAGNOSIS — Z8679 Personal history of other diseases of the circulatory system: Secondary | ICD-10-CM | POA: Diagnosis not present

## 2019-09-02 NOTE — Progress Notes (Signed)
Cardiology Office Note:    Date:  09/02/2019   ID:  Skylur Bedner, DOB [birth year], MRN [id number]  PCP:  Lavone Orn, MD  Cardiologist:  Candee Furbish, MD  Electrophysiologist:  None   Referring MD: Lavone Orn, MD     History of Present Illness:    James [name] is a 73 y.o. male here for f follow-up of atrial flutter status post ablation as well as mitral valve pair.  Had ablation done Dr. Rayann Heman.   Prior mitral valve repair in 2014 by Dr. Roxy Manns.  There was no documented atrial fibrillation or flutter post ablation.  Dr. Rayann Heman advised implantable loop recorder to see if he has any atrial arrhythmias going forward.  He would prefer to stop Eliquis and start aspirin.  He will let us know.  Understands that he may have an increased stroke risk if atrial flutter were to return.  Thankfully, he has not had any further palpitations.  Continues to be in sinus rhythm.  Denies any fevers chills nausea vomiting syncope bleeding  Past Medical History:  Diagnosis Date  . Barrett esophagus 2002  . Erectile dysfunction   . GERD (gastroesophageal reflux disease)   . Hyperlipidemia   . Hypertension   . Pneumonia 2000   hospitalized  . S/P mitral valve repair 01/06/2013   Complex valvuloplasty including triangular resection of flail segment of posterior leaflet, artificial Gore-tex neocord placement x4 and 32 mm Sorin Memo 3D ring annuloplasty via right mini thoracotomy  . Severe mitral regurgitation     Past Surgical History:  Procedure Laterality Date  . A-FLUTTER ABLATION N/A 08/26/2017   Procedure: A-FLUTTER ABLATION;  Surgeon: Thompson Grayer, MD;  Location: [place] [facility];  Service: Cardiovascular;  Laterality: N/A;  . CARDIAC CATHETERIZATION    . CARDIOVERSION N/A 07/01/2017   Procedure: CARDIOVERSION;  Surgeon: Lelon Perla, MD;  Location: [facility] ENDOSCOPY;  Service: Cardiovascular;  Laterality: N/A;  . COLONOSCOPY    . INTRAOPERATIVE TRANSESOPHAGEAL ECHOCARDIOGRAM N/A 01/06/2013   Procedure: INTRAOPERATIVE TRANSESOPHAGEAL ECHOCARDIOGRAM;  Surgeon: Rexene Alberts, MD;  Location: [place];  Service: Open Heart Surgery;  Laterality: N/A;  . MITRAL VALVE REPAIR Right 01/06/2013   Procedure: MINIMALLY INVASIVE MITRAL VALVE REPAIR (MVR);  Surgeon: Rexene Alberts, MD;  Location: [place];  Service: Open Heart Surgery;  Laterality: Right;  . TEE WITHOUT CARDIOVERSION N/A 12/03/2012   Procedure: TRANSESOPHAGEAL ECHOCARDIOGRAM (TEE);  Surgeon: Candee Furbish, MD;  Location: [facility] ENDOSCOPY;  Service: Cardiovascular;  Laterality: N/A;  h/p in file cabinet-ja  . TEE WITHOUT CARDIOVERSION N/A 07/01/2017   Procedure: TRANSESOPHAGEAL ECHOCARDIOGRAM (TEE);  Surgeon: Lelon Perla, MD;  Location: [facility] ENDOSCOPY;  Service: Cardiovascular;  Laterality: N/A;    Current Medications: Current Meds  Medication Sig  . aspirin EC 81 MG tablet Take 1 tablet (81 mg total) by mouth daily.  Marland Kitchen atorvastatin (LIPITOR) 20 MG tablet Take 20 mg every evening by mouth.   . Coenzyme Q10 (CO Q-10) 300 MG CAPS Take 300 mg daily by mouth.   . fish oil-omega-3 fatty acids 1000 MG capsule Take 1 g by mouth daily.   . fluticasone (FLONASE) 50 MCG/ACT nasal spray Place 2 sprays into both nostrils daily as needed for allergies or rhinitis.   . metoprolol succinate (TOPROL-XL) 50 MG 24 hr tablet TAKE 1 TABLET BY MOUTH TWICE DAILY, AFTER MEALS  . Multiple Vitamin (MULTIVITAMIN WITH MINERALS) TABS Take 1 tablet by mouth daily.  Marland Kitchen omeprazole (PRILOSEC OTC) 20 MG tablet Take 20 mg  every evening by mouth.   . Propylene Glycol-Glycerin (MOISTURE EYES OP) Apply 1 drop daily as needed to eye (dry eyes).  . sildenafil (VIAGRA) 50 MG tablet Take 50 mg by mouth as needed for erectile dysfunction (take as directed).      Allergies:   Iodinated diagnostic agents   Social History   Socioeconomic History  . Marital status: Married    Spouse name: Not on file  . Number of children: 3  . Years of education: Not on file  . Highest  education level: Not on file  Occupational History  . Occupation: retired    Comment: Teacher, English as a foreign language  Social Needs  . Financial resource strain: Not on file  . Food insecurity    Worry: Not on file    Inability: Not on file  . Transportation needs    Medical: Not on file    Non-medical: Not on file  Tobacco Use  . Smoking status: Never Smoker  . Smokeless tobacco: Never Used  Substance and Sexual Activity  . Alcohol use: Yes    Alcohol/week: 21.0 standard drinks    Types: 7 Glasses of wine, 14 Cans of beer per week  . Drug use: No  . Sexual activity: Not on file  Lifestyle  . Physical activity    Days per week: Not on file    Minutes per session: Not on file  . Stress: Not on file  Relationships  . Social Herbalist on phone: Not on file    Gets together: Not on file    Attends religious service: Not on file    Active member of club or organization: Not on file    Attends meetings of clubs or organizations: Not on file    Relationship status: Not on file  Other Topics Concern  . Not on file  Social History Narrative  . Not on file     Family History: The patient's family history includes COPD in his mother; Heart failure in his mother; Hypertension in his mother.  ROS:   Please see the history of present illness.    Denies any fevers chills nausea vomiting syncope all other systems reviewed and are negative.  EKGs/Labs/Other Studies Reviewed:    The following studies were reviewed today: Prior office notes EKG lab work  EKG:  EKG is  ordered today.  The ekg ordered today demonstrates 09/02/2019 NSR NSST changes.  sinus rhythm 71 with no other significant abnormalities personally reviewed and interpreted  Recent Labs: No results found for requested labs within last 8760 hours.  Recent Lipid Panel No results found for: CHOL, TRIG, HDL, CHOLHDL, VLDL, LDLCALC, LDLDIRECT  Physical Exam:    VS:  BP 112/80   Pulse 86   Ht 5\' 11"  (1.803 m)   Wt  186 lb (84.4 kg)   SpO2 98%   BMI 25.94 kg/m     Wt Readings from Last 3 Encounters:  09/02/19 186 lb (84.4 kg)  09/01/18 184 lb 1.9 oz (83.5 kg)  10/06/17 189 lb 3.2 oz (85.8 kg)     GEN: Well nourished, well developed, in no acute distress  HEENT: normal  Neck: no JVD, carotid bruits, or masses Cardiac: RRR; no murmurs, rubs, or gallops,no edema  Respiratory:  clear to auscultation bilaterally, normal work of breathing GI: soft, nontender, nondistended, + BS MS: no deformity or atrophy  Skin: warm and dry, no rash Neuro:  Alert and Oriented x 3, Strength and sensation are  intact Psych: euthymic mood, full affect   ASSESSMENT:    1. S/P ablation of atrial flutter   2. S/P mitral valve repair    PLAN:    In order of problems listed above:  Atrial flutter -Status post ablation.  Currently on aspirin.  Has not had any further recurrence.  Understands that this may occur in the future and does put him at a increased stroke risk if he is in atrial fibrillation without anticoagulation. -Continue with metoprolol succinate 50 twice a day  Mitral valve repair -Continue with dental antibiotics. Dental abx.  Hyperlipidemia -Atorvastatin 20 mg once a day  Overall doing well.   Medication Adjustments/Labs and Tests Ordered: Current medicines are reviewed at length with the patient today.  Concerns regarding medicines are outlined above.  No orders of the defined types were placed in this encounter.  No orders of the defined types were placed in this encounter.   Patient Instructions  Medication Instructions:  Your physician recommends that you continue on your current medications as directed. Please refer to the Current Medication list given to you today.  *If you need a refill on your cardiac medications before your next appointment, please call your pharmacy*  Follow-Up: At [facility], you and your health needs are our priority.  As part of our continuing mission  to provide you with exceptional heart care, we have created designated Provider Care Teams.  These Care Teams include your primary Cardiologist (physician) and Advanced Practice Providers (APPs -  Physician Assistants and Nurse Practitioners) who all work together to provide you with the care you need, when you need it.  Your next appointment:   1 year(s)  The format for your next appointment:   In Person  Provider:   Candee Furbish, MD     Signed, Candee Furbish, MD  09/02/2019 2:07 PM    [place]

## 2019-09-02 NOTE — Patient Instructions (Signed)
Medication Instructions:  Your physician recommends that you continue on your current medications as directed. Please refer to the Current Medication list given to you today.  *If you need a refill on your cardiac medications before your next appointment, please call your pharmacy*  Follow-Up: At [facility], you and your health needs are our priority.  As part of our continuing mission to provide you with exceptional heart care, we have created designated Provider Care Teams.  These Care Teams include your primary Cardiologist (physician) and Advanced Practice Providers (APPs -  Physician Assistants and Nurse Practitioners) who all work together to provide you with the care you need, when you need it.  Your next appointment:   1 year(s)  The format for your next appointment:   In Person  Provider:   Candee Furbish, MD

## 2019-10-26 ENCOUNTER — Ambulatory Visit: Payer: Medicare Other

## 2019-11-04 ENCOUNTER — Ambulatory Visit: Payer: Medicare Other | Attending: Internal Medicine

## 2019-11-04 DIAGNOSIS — Z23 Encounter for immunization: Secondary | ICD-10-CM | POA: Insufficient documentation

## 2019-11-04 NOTE — Progress Notes (Signed)
   Covid-19 Vaccination Clinic  Name:  James [name]    MRN: [id number] DOB: 03/26/1946  11/04/2019  Mr. James [name] was observed post Covid-19 immunization for 15 minutes without incidence. He was provided with Vaccine Information Sheet and instruction to access the V-Safe system.   Mr. James [name] was instructed to call 911 with any severe reactions post vaccine: Marland Kitchen Difficulty breathing  . Swelling of your face and throat  . A fast heartbeat  . A bad rash all over your body  . Dizziness and weakness    Immunizations Administered    Name Date Dose VIS Date Route   Pfizer COVID-19 Vaccine 11/04/2019  3:52 PM 0.3 mL 09/10/2019 Intramuscular   Manufacturer: [place]   Lot: [id number]   [place]: [id number]

## 2019-11-19 ENCOUNTER — Other Ambulatory Visit: Payer: Self-pay | Admitting: Cardiology

## 2019-11-29 ENCOUNTER — Ambulatory Visit: Payer: Medicare Other | Attending: Internal Medicine

## 2019-11-29 DIAGNOSIS — Z23 Encounter for immunization: Secondary | ICD-10-CM | POA: Insufficient documentation

## 2019-11-29 NOTE — Progress Notes (Signed)
   Covid-19 Vaccination Clinic  Name:  James [name]    MRN: [id number] DOB: [birth year]  11/29/2019  James [name] was observed post Covid-19 immunization for 15 minutes without incidence. He was provided with Vaccine Information Sheet and instruction to access the V-Safe system.   James [name] was instructed to call 911 with any severe reactions post vaccine: Marland Kitchen Difficulty breathing  . Swelling of your face and throat  . A fast heartbeat  . A bad rash all over your body  . Dizziness and weakness    Immunizations Administered    Name Date Dose VIS Date Route   Pfizer COVID-19 Vaccine 11/29/2019  4:49 PM 0.3 mL 09/10/2019 Intramuscular   Manufacturer: [place]   Lot: [id number]   [place]: [id number]

## 2020-09-04 ENCOUNTER — Ambulatory Visit: Payer: Medicare Other | Admitting: Cardiology

## 2020-09-05 ENCOUNTER — Encounter: Payer: Self-pay | Admitting: Cardiology

## 2020-09-05 ENCOUNTER — Ambulatory Visit: Payer: Medicare Other | Admitting: Cardiology

## 2020-09-05 ENCOUNTER — Other Ambulatory Visit: Payer: Self-pay

## 2020-09-05 VITALS — BP 134/78 | HR 75 | Ht 71.0 in | Wt 184.0 lb

## 2020-09-05 DIAGNOSIS — Z9889 Other specified postprocedural states: Secondary | ICD-10-CM

## 2020-09-05 DIAGNOSIS — E7800 Pure hypercholesterolemia, unspecified: Secondary | ICD-10-CM

## 2020-09-05 DIAGNOSIS — I4892 Unspecified atrial flutter: Secondary | ICD-10-CM | POA: Diagnosis not present

## 2020-09-05 DIAGNOSIS — I340 Nonrheumatic mitral (valve) insufficiency: Secondary | ICD-10-CM

## 2020-09-05 NOTE — Patient Instructions (Signed)
Medication Instructions:  The current medical regimen is effective;  continue present plan and medications.  *If you need a refill on your cardiac medications before your next appointment, please call your pharmacy*  Testing/Procedures: Your physician has requested that you have an echocardiogram. Echocardiography is a painless test that uses sound waves to create images of your heart. It provides your doctor with information about the size and shape of your heart and how well your heart's chambers and valves are working. This procedure takes approximately one hour. There are no restrictions for this procedure.  Follow-Up: At [facility], you and your health needs are our priority.  As part of our continuing mission to provide you with exceptional heart care, we have created designated Provider Care Teams.  These Care Teams include your primary Cardiologist (physician) and Advanced Practice Providers (APPs -  Physician Assistants and Nurse Practitioners) who all work together to provide you with the care you need, when you need it.  We recommend signing up for the patient portal called "MyChart".  Sign up information is provided on this After Visit Summary.  MyChart is used to connect with patients for Virtual Visits (Telemedicine).  Patients are able to view lab/test results, encounter notes, upcoming appointments, etc.  Non-urgent messages can be sent to your provider as well.   To learn more about what you can do with MyChart, go to [email or web address].    Your next appointment:   12 month(s)  The format for your next appointment:   In Person  Provider:   Mark Skains, MD   Thank you for choosing [facility]!!      

## 2020-09-05 NOTE — Progress Notes (Signed)
Cardiology Office Note:    Date:  09/05/2020   ID:  James [name], DOB August 16, 1946, MRN [id number]  PCP:  Lavone Orn, MD  [facility] Cardiologist:  Candee Furbish, MD  [facility] Electrophysiologist:  None   Referring MD: Lavone Orn, MD     History of Present Illness:    James [name] is a 74 y.o. male here for the follow-up of atrial flutter post ablation as well as mitral valve repair.  Dr. Rayann Heman ablation.  Mitral valve repair 2014 Dr. Roxy Manns.  Post ablation, no documented atrial fibrillation or flutter.  Overall doing quite well no fevers chills nausea vomiting syncope bleeding.  Past Medical History:  Diagnosis Date  . Barrett esophagus 2002  . Erectile dysfunction   . GERD (gastroesophageal reflux disease)   . Hyperlipidemia   . Hypertension   . Pneumonia 2000   hospitalized  . S/P mitral valve repair 01/06/2013   Complex valvuloplasty including triangular resection of flail segment of posterior leaflet, artificial Gore-tex neocord placement x4 and 32 mm Sorin Memo 3D ring annuloplasty via right mini thoracotomy  . Severe mitral regurgitation     Past Surgical History:  Procedure Laterality Date  . A-FLUTTER ABLATION N/A 08/26/2017   Procedure: A-FLUTTER ABLATION;  Surgeon: Thompson Grayer, MD;  Location: [place] [facility];  Service: Cardiovascular;  Laterality: N/A;  . CARDIAC CATHETERIZATION    . CARDIOVERSION N/A 07/01/2017   Procedure: CARDIOVERSION;  Surgeon: Lelon Perla, MD;  Location: [facility] ENDOSCOPY;  Service: Cardiovascular;  Laterality: N/A;  . COLONOSCOPY    . INTRAOPERATIVE TRANSESOPHAGEAL ECHOCARDIOGRAM N/A 01/06/2013   Procedure: INTRAOPERATIVE TRANSESOPHAGEAL ECHOCARDIOGRAM;  Surgeon: Rexene Alberts, MD;  Location: [place];  Service: Open Heart Surgery;  Laterality: N/A;  . MITRAL VALVE REPAIR Right 01/06/2013   Procedure: MINIMALLY INVASIVE MITRAL VALVE REPAIR (MVR);  Surgeon: Rexene Alberts, MD;  Location: [place];  Service: Open Heart Surgery;   Laterality: Right;  . TEE WITHOUT CARDIOVERSION N/A 12/03/2012   Procedure: TRANSESOPHAGEAL ECHOCARDIOGRAM (TEE);  Surgeon: Candee Furbish, MD;  Location: [facility] ENDOSCOPY;  Service: Cardiovascular;  Laterality: N/A;  h/p in file cabinet-ja  . TEE WITHOUT CARDIOVERSION N/A 07/01/2017   Procedure: TRANSESOPHAGEAL ECHOCARDIOGRAM (TEE);  Surgeon: Lelon Perla, MD;  Location: [facility] ENDOSCOPY;  Service: Cardiovascular;  Laterality: N/A;    Current Medications: Current Meds  Medication Sig  . aspirin EC 81 MG tablet Take 1 tablet (81 mg total) by mouth daily.  Marland Kitchen atorvastatin (LIPITOR) 20 MG tablet Take 20 mg every evening by mouth.   . Coenzyme Q10 (CO Q-10) 300 MG CAPS Take 300 mg daily by mouth.   . fish oil-omega-3 fatty acids 1000 MG capsule Take 1 g by mouth daily.   . fluticasone (FLONASE) 50 MCG/ACT nasal spray Place 2 sprays into both nostrils daily as needed for allergies or rhinitis.   . metoprolol succinate (TOPROL-XL) 50 MG 24 hr tablet TAKE 1 TABLET BY MOUTH TWICE DAILY AFTER MEALS  . Multiple Vitamin (MULTIVITAMIN WITH MINERALS) TABS Take 1 tablet by mouth daily.  Marland Kitchen omeprazole (PRILOSEC OTC) 20 MG tablet Take 20 mg every evening by mouth.   . Propylene Glycol-Glycerin (MOISTURE EYES OP) Apply 1 drop daily as needed to eye (dry eyes).  . sildenafil (VIAGRA) 50 MG tablet Take 50 mg by mouth as needed for erectile dysfunction (take as directed).      Allergies:   Iodinated diagnostic agents   Social History   Socioeconomic History  . Marital status: Married  Spouse name: Not on file  . Number of children: 3  . Years of education: Not on file  . Highest education level: Not on file  Occupational History  . Occupation: retired    Comment: Teacher, English as a foreign language  Tobacco Use  . Smoking status: Never Smoker  . Smokeless tobacco: Never Used  Vaping Use  . Vaping Use: Never used  Substance and Sexual Activity  . Alcohol use: Yes    Alcohol/week: 21.0 standard drinks    Types: 7  Glasses of wine, 14 Cans of beer per week  . Drug use: No  . Sexual activity: Not on file  Other Topics Concern  . Not on file  Social History Narrative  . Not on file   Social Determinants of Health   Financial Resource Strain:   . Difficulty of Paying Living Expenses: Not on file  Food Insecurity:   . Worried About Charity fundraiser in the Last Year: Not on file  . Ran Out of Food in the Last Year: Not on file  Transportation Needs:   . Lack of Transportation (Medical): Not on file  . Lack of Transportation (Non-Medical): Not on file  Physical Activity:   . Days of Exercise per Week: Not on file  . Minutes of Exercise per Session: Not on file  Stress:   . Feeling of Stress : Not on file  Social Connections:   . Frequency of Communication with Friends and Family: Not on file  . Frequency of Social Gatherings with Friends and Family: Not on file  . Attends Religious Services: Not on file  . Active Member of Clubs or Organizations: Not on file  . Attends Archivist Meetings: Not on file  . Marital Status: Not on file     Family History: The patient's family history includes COPD in his mother; Heart failure in his mother; Hypertension in his mother.  ROS:   Please see the history of present illness.    No fevers chills nausea vomiting syncope bleeding all other systems reviewed and are negative.  EKGs/Labs/Other Studies Reviewed:    The following studies were reviewed today:  Transesophageal echocardiogram 07/01/2017 - Normal LV function; s/p MV repair with mean gradient 4 mmHg and  mild MR; severe LAE; mild TR.   EKG:  EKG is  ordered today.  The ekg ordered today demonstrates sinus rhythm 75 no other abnormalities on 09/02/2019 EKG showed normal sinus rhythm with nonspecific ST-T wave changes.  Recent Labs: No results found for requested labs within last 8760 hours.  Recent Lipid Panel No results found for: CHOL, TRIG, HDL, CHOLHDL, VLDL, LDLCALC,  LDLDIRECT    Physical Exam:    VS:  BP 134/78 (BP Location: Left Arm, Patient Position: Sitting, Cuff Size: Normal)   Pulse 75   Ht 5\' 11"  (1.803 m)   Wt 184 lb (83.5 kg)   SpO2 98%   BMI 25.66 kg/m     Wt Readings from Last 3 Encounters:  09/05/20 184 lb (83.5 kg)  09/02/19 186 lb (84.4 kg)  09/01/18 184 lb 1.9 oz (83.5 kg)     GEN:  Well nourished, well developed in no acute distress HEENT: Normal NECK: No JVD; No carotid bruits LYMPHATICS: No lymphadenopathy CARDIAC: RRR, no murmurs, rubs, gallops RESPIRATORY:  Clear to auscultation without rales, wheezing or rhonchi  ABDOMEN: Soft, non-tender, non-distended MUSCULOSKELETAL:  No edema; No deformity  SKIN: Warm and dry NEUROLOGIC:  Alert and oriented x 3 PSYCHIATRIC:  Normal affect   ASSESSMENT:    1. Atrial flutter, unspecified type ([place])   2. S/P mitral valve repair   3. Nonrheumatic mitral valve regurgitation   4. Pure hypercholesterolemia    PLAN:    In order of problems listed above:  Flutter ablation, atrial flutter -Post ablation Dr. Rayann Heman.  No further recurrence. -On metoprolol succinate 50 mg twice a day. -Off of anticoagulation.  Of course if this returns, we will need to start.  Mitral valve repair -Dr. Roxy Manns.  Dental antibiotics.  Doing well.  Stable. -It has been a few years since checking echo.  We will go ahead and order. -Continue with low-dose aspirin.  Hyperlipidemia -On atorvastatin 20 mg a day no myalgias.  LDL 73 on 07/06/2020, ALT 13.  Creatinine 1.03.    Shared Decision Making/Informed Consent        Medication Adjustments/Labs and Tests Ordered: Current medicines are reviewed at length with the patient today.  Concerns regarding medicines are outlined above.  Orders Placed This Encounter  Procedures  . EKG 12-Lead  . ECHOCARDIOGRAM COMPLETE   No orders of the defined types were placed in this encounter.   Patient Instructions  Medication Instructions:  The current  medical regimen is effective;  continue present plan and medications.  *If you need a refill on your cardiac medications before your next appointment, please call your pharmacy*  Testing/Procedures: Your physician has requested that you have an echocardiogram. Echocardiography is a painless test that uses sound waves to create images of your heart. It provides your doctor with information about the size and shape of your heart and how well your heart's chambers and valves are working. This procedure takes approximately one hour. There are no restrictions for this procedure.  Follow-Up: At [facility], you and your health needs are our priority.  As part of our continuing mission to provide you with exceptional heart care, we have created designated Provider Care Teams.  These Care Teams include your primary Cardiologist (physician) and Advanced Practice Providers (APPs -  Physician Assistants and Nurse Practitioners) who all work together to provide you with the care you need, when you need it.  We recommend signing up for the patient portal called "MyChart".  Sign up information is provided on this After Visit Summary.  MyChart is used to connect with patients for Virtual Visits (Telemedicine).  Patients are able to view lab/test results, encounter notes, upcoming appointments, etc.  Non-urgent messages can be sent to your provider as well.   To learn more about what you can do with MyChart, go to [email or web address].    Your next appointment:   12 month(s)  The format for your next appointment:   In Person  Provider:   Candee Furbish, MD   Thank you for choosing [facility]!!        Signed, Candee Furbish, MD  09/05/2020 11:34 AM    [place]

## 2020-09-12 ENCOUNTER — Other Ambulatory Visit: Payer: Self-pay

## 2020-09-12 MED ORDER — METOPROLOL SUCCINATE ER 50 MG PO TB24: ORAL_TABLET | ORAL | 3 refills | Status: DC

## 2020-10-04 ENCOUNTER — Ambulatory Visit (HOSPITAL_COMMUNITY): Payer: Medicare Other | Attending: Cardiology

## 2020-10-04 ENCOUNTER — Other Ambulatory Visit: Payer: Self-pay

## 2020-10-04 DIAGNOSIS — Z9889 Other specified postprocedural states: Secondary | ICD-10-CM

## 2020-10-04 DIAGNOSIS — I340 Nonrheumatic mitral (valve) insufficiency: Secondary | ICD-10-CM

## 2020-10-04 DIAGNOSIS — Z954 Presence of other heart-valve replacement: Secondary | ICD-10-CM | POA: Diagnosis not present

## 2020-10-04 LAB — ECHOCARDIOGRAM COMPLETE
Area-P 1/2: 2.27 cm2
S' Lateral: 2.2 cm

## 2020-10-18 DIAGNOSIS — R059 Cough, unspecified: Secondary | ICD-10-CM | POA: Diagnosis not present

## 2021-05-16 DIAGNOSIS — H25013 Cortical age-related cataract, bilateral: Secondary | ICD-10-CM | POA: Diagnosis not present

## 2021-05-16 DIAGNOSIS — H40013 Open angle with borderline findings, low risk, bilateral: Secondary | ICD-10-CM | POA: Diagnosis not present

## 2021-05-16 DIAGNOSIS — H2513 Age-related nuclear cataract, bilateral: Secondary | ICD-10-CM | POA: Diagnosis not present

## 2021-07-10 DIAGNOSIS — K2289 Other specified disease of esophagus: Secondary | ICD-10-CM | POA: Diagnosis not present

## 2021-07-10 DIAGNOSIS — K2270 Barrett's esophagus without dysplasia: Secondary | ICD-10-CM | POA: Diagnosis not present

## 2021-07-10 DIAGNOSIS — K2100 Gastro-esophageal reflux disease with esophagitis, without bleeding: Secondary | ICD-10-CM | POA: Diagnosis not present

## 2021-07-20 DIAGNOSIS — K2100 Gastro-esophageal reflux disease with esophagitis, without bleeding: Secondary | ICD-10-CM | POA: Diagnosis not present

## 2021-08-08 ENCOUNTER — Telehealth: Payer: Self-pay | Admitting: Cardiology

## 2021-08-08 DIAGNOSIS — I10 Essential (primary) hypertension: Secondary | ICD-10-CM | POA: Diagnosis not present

## 2021-08-08 DIAGNOSIS — Z8774 Personal history of (corrected) congenital malformations of heart and circulatory system: Secondary | ICD-10-CM | POA: Diagnosis not present

## 2021-08-08 DIAGNOSIS — Z9889 Other specified postprocedural states: Secondary | ICD-10-CM | POA: Diagnosis not present

## 2021-08-08 DIAGNOSIS — E785 Hyperlipidemia, unspecified: Secondary | ICD-10-CM | POA: Diagnosis not present

## 2021-08-08 DIAGNOSIS — R9431 Abnormal electrocardiogram [ECG] [EKG]: Secondary | ICD-10-CM | POA: Diagnosis not present

## 2021-08-08 DIAGNOSIS — R0789 Other chest pain: Secondary | ICD-10-CM | POA: Diagnosis not present

## 2021-08-08 DIAGNOSIS — Z8719 Personal history of other diseases of the digestive system: Secondary | ICD-10-CM | POA: Diagnosis not present

## 2021-08-08 DIAGNOSIS — R072 Precordial pain: Secondary | ICD-10-CM | POA: Diagnosis not present

## 2021-08-08 NOTE — Telephone Encounter (Signed)
Agree with plan Jaidyn Kuhl, MD  

## 2021-08-08 NOTE — Telephone Encounter (Signed)
Pt c/o of Chest Pain: STAT if CP now or developed within 24 hours  1. Are you having CP right now?  Yes, but patient states it isn't as severe as it was early this morning  2. Are you experiencing any other symptoms (ex. SOB, nausea, vomiting, sweating)?  Chest pressure/tightness  3. How long have you been experiencing CP?  Developed around 1:00 AM, while patient was sleeping. It woke him up out of his sleep  4. Is your CP continuous or coming and going?  Coming and going   5. Have you taken Nitroglycerin?  No, but patient states he took an extra Aspirin this morning  ?

## 2021-08-08 NOTE — Telephone Encounter (Signed)
Pt was in [facility] but left first thing this morning due to CP.  Woke up around 1am with sharp CP that felt like he had a weight sitting on his chest.  Was unable to get back to sleep.  Left early this morning to head back here to be seen.  Pain is coming and going.  Hurts worse on inhalation.  Took four baby ASA this morning prior to leaving.  Still currently having CP but has improved slightly.  Currently almost in [place], [place].  Advised pt to stop at the nearest hospital for evaluation.  Pt agreeable to plan.  Advised to contact our office at discharge so we can arrange appropriate f/u.  Pt appreciative for assistance.

## 2021-08-09 NOTE — Telephone Encounter (Signed)
Left message for pt to c/b or send MyChart message regarding the occurrence yesterday while being seen in another hospital system for CP.

## 2021-08-09 NOTE — Telephone Encounter (Signed)
Patient was calling back to give up day from instruction on yesterday. Please advise

## 2021-08-10 ENCOUNTER — Telehealth: Payer: Self-pay | Admitting: *Deleted

## 2021-08-10 NOTE — Telephone Encounter (Signed)
Glad to hear that the took place in that you did not have any evidence of heart attack.  Excellent.  Normal troponin.  Prior to mitral valve replacement there was no evidence of coronary disease on cardiac catheterization.  Probably musculoskeletal discomfort.  If symptoms return or become more worrisome, please let me know.  For now, I think it is reasonable to continue with his normal follow-up.  Candee Furbish, MD

## 2021-08-10 NOTE — Telephone Encounter (Signed)
Notes from [facility]:  Patient has no exertional symptoms.Heart score indicates low risk for Mace. Patient states complete resolution of his chest pain prior to discharge. Stable for discharge home.  ED Course as of 08/08/21 1645  [facility] Documentation  Wed Aug 08, 2021  1042 Twelve lead EKG was performed reviewed by me and demonstrates normal sinus rhythm with sinus arrhythmia. Ventricular rate of 75 beats per minute. There are no significant ST or T-wave changes to indicate ischemia or infarction per my interpretation.  1043 CBC with differential reveals no leukocytosis and no anemia. Platelets are within normal limits. It is macrocytic normochromic.  1047 D-dimer within normal limits.  1634 Troponin within normal limits. BNP mildly elevated 187.9. Magnesium low 1.7. It lipase within normal limits. D-dimer within normal limits. Comprehensive metabolic panel reveals no abnormalities.  Chest x-ray performed reviewed by me demonstrates no infiltrates effusions or pneumothorax per my interpretation.    Clinical Impressions as of 08/08/21 1645  Nonspecific chest pain  Hypomagnesemia     Will forward to Dr Marlou Porch for review and any new orders.

## 2021-08-10 NOTE — Telephone Encounter (Signed)
Patient returned your call.

## 2021-08-10 NOTE — Telephone Encounter (Signed)
Spoke with pt who called to report [place] approximately 1 am [place].  He had not felt anything like it before. Like a 5 to 10 lb wt on his chest.  Pain was worse on inhalation.  Was able to sleep off and on but at 6 am he and his wife left to come back home.  He called the office at was instructed to go to the closest ED for further evaluation and treatment.  Went to [facility] in [place] and was evaluated (records can be seen in [facility]).  He was advised he was not and did not have an MI but they never determined the cause of his discomfort.  He reports feeling completely fine now.  He reports he usually takes Prilosec daily but had not taken it that evening as normal.  He had also gone a day or so before to shoot guns with a friend.  This is something he has never done before and reports some of the guns were very heavy.

## 2021-08-14 NOTE — Telephone Encounter (Signed)
Spoke with pt who reports he is feeling fine.  Not having any chest pain or discomfort.  He is aware to call back of s/s reoccur.  He has been scheduled for f/u with PA but has also been placed on the wait list for an appt with Derl Barrow should one come available.

## 2021-08-20 ENCOUNTER — Other Ambulatory Visit: Payer: Self-pay | Admitting: Cardiology

## 2021-09-13 DIAGNOSIS — L821 Other seborrheic keratosis: Secondary | ICD-10-CM | POA: Diagnosis not present

## 2021-09-13 DIAGNOSIS — D1801 Hemangioma of skin and subcutaneous tissue: Secondary | ICD-10-CM | POA: Diagnosis not present

## 2021-09-13 DIAGNOSIS — L570 Actinic keratosis: Secondary | ICD-10-CM | POA: Diagnosis not present

## 2021-09-13 DIAGNOSIS — L720 Epidermal cyst: Secondary | ICD-10-CM | POA: Diagnosis not present

## 2021-09-21 DIAGNOSIS — R000 Tachycardia, unspecified: Secondary | ICD-10-CM | POA: Diagnosis not present

## 2021-09-21 DIAGNOSIS — Z0000 Encounter for general adult medical examination without abnormal findings: Secondary | ICD-10-CM | POA: Diagnosis not present

## 2021-09-21 DIAGNOSIS — K219 Gastro-esophageal reflux disease without esophagitis: Secondary | ICD-10-CM | POA: Diagnosis not present

## 2021-09-21 DIAGNOSIS — I10 Essential (primary) hypertension: Secondary | ICD-10-CM | POA: Diagnosis not present

## 2021-09-21 DIAGNOSIS — Z1389 Encounter for screening for other disorder: Secondary | ICD-10-CM | POA: Diagnosis not present

## 2021-09-21 DIAGNOSIS — I483 Typical atrial flutter: Secondary | ICD-10-CM | POA: Diagnosis not present

## 2021-09-21 DIAGNOSIS — E782 Mixed hyperlipidemia: Secondary | ICD-10-CM | POA: Diagnosis not present

## 2021-09-25 ENCOUNTER — Telehealth: Payer: Self-pay | Admitting: Cardiology

## 2021-09-25 NOTE — Telephone Encounter (Signed)
Appt with At [place] 12/28 at 8:30 am AFIB CLINIC INFORMATION: Your appointment is scheduled on: 12/28 at 8:30 am with Roderic Palau. Please arrive 15 minutes early for check-in. The AFib Clinic is located in the Heart and Vascular Specialty Clinics at [facility]. Parking instructions/directions: [address] (off [employer]). When you pull in to Entrance C, there is an underground parking garage to your right. The code to enter the garage is 5555. Take the elevators to the first floor. Follow the signs to the Heart and Vascular Specialty Clinics. You will see registration at the end of the hallway.  Phone number: [phone]  Parking codes for 12/20/2021. Please remove below information before giving to patient: Dec 2022 December 20, 5553    Left message for pt of appt date, time, location and parking code. Requested he c/b with any questions/concerns.

## 2021-09-25 NOTE — Telephone Encounter (Signed)
Reviewed with Dr Rayann Heman who advises pt be seen in the At [facility].

## 2021-09-25 NOTE — Telephone Encounter (Signed)
Spoke with patient who reports Dr Laurann [place] detected [facility] [place] at his yearly physical on Friday.  He was restarted on Eliquis 5 mg BID.  Pt takes Metoprolol Succinate 50 mg BID but admits to missing several evening doses recently.  He has also had a little more alcohol during the recent holidays.   HR today is 130 per pt's report.  Advised I will review with MD and call back with further instructions.

## 2021-09-25 NOTE — Telephone Encounter (Signed)
Patient c/o Palpitations:  High priority if patient c/o lightheadedness, shortness of breath, or chest pain  How long have you had palpitations/irregular HR/ Afib? Are you having the symptoms now? yes  Are you currently experiencing lightheadedness, SOB or CP? No today but did on christmas eve [place] of lightheadedness, CP  Do you have a history of afib (atrial fibrillation) or irregular heart rhythm? yes  Have you checked your BP or HR? (document readings if available): no  Are you experiencing any other symptoms? Felt like having a heart attach   His pcp put in him on Eliquis on Friday. And said he needed to be seen asap

## 2021-09-26 ENCOUNTER — Ambulatory Visit (HOSPITAL_COMMUNITY)
Admission: RE | Admit: 2021-09-26 | Discharge: 2021-09-26 | Disposition: A | Discharge status: Home / Self Care | Payer: Medicare Other | Source: Ambulatory Visit | Attending: Nurse Practitioner | Admitting: Nurse Practitioner

## 2021-09-26 ENCOUNTER — Other Ambulatory Visit: Payer: Self-pay

## 2021-09-26 ENCOUNTER — Encounter (HOSPITAL_COMMUNITY): Payer: Self-pay | Admitting: Nurse Practitioner

## 2021-09-26 VITALS — BP 132/96 | HR 129 | Ht 71.0 in | Wt 186.4 lb

## 2021-09-26 DIAGNOSIS — R400 Somnolence: Secondary | ICD-10-CM | POA: Insufficient documentation

## 2021-09-26 DIAGNOSIS — Z7901 Long term (current) use of anticoagulants: Secondary | ICD-10-CM | POA: Insufficient documentation

## 2021-09-26 DIAGNOSIS — R0683 Snoring: Secondary | ICD-10-CM | POA: Diagnosis not present

## 2021-09-26 DIAGNOSIS — I4892 Unspecified atrial flutter: Secondary | ICD-10-CM

## 2021-09-26 DIAGNOSIS — D6869 Other thrombophilia: Secondary | ICD-10-CM | POA: Diagnosis not present

## 2021-09-26 DIAGNOSIS — I340 Nonrheumatic mitral (valve) insufficiency: Secondary | ICD-10-CM | POA: Diagnosis not present

## 2021-09-26 DIAGNOSIS — Z95818 Presence of other cardiac implants and grafts: Secondary | ICD-10-CM | POA: Diagnosis not present

## 2021-09-26 DIAGNOSIS — Z79899 Other long term (current) drug therapy: Secondary | ICD-10-CM | POA: Insufficient documentation

## 2021-09-26 DIAGNOSIS — I484 Atypical atrial flutter: Secondary | ICD-10-CM | POA: Diagnosis not present

## 2021-09-26 MED ORDER — METOPROLOL SUCCINATE ER 50 MG PO TB24: 75.0000 mg | ORAL_TABLET | Freq: Two times a day (BID) | ORAL | 0 refills | Status: DC

## 2021-09-26 NOTE — Patient Instructions (Signed)
Increase metoprolol to 1 and 1/2 tablets twice a day  Cardioversion scheduled for Tuesday, January 17th  - Arrive at the [facility] and go to admitting at [place] not eat or drink anything after midnight the night prior to your procedure.  - Take all your morning medication (except diabetic medications) with a sip of water prior to arrival.  - You will not be able to drive home after your procedure.  - Do NOT miss any doses of your blood thinner - if you should miss a dose please notify our office immediately.  - If you feel as if you go back into normal rhythm prior to scheduled cardioversion, please notify our office immediately. If your procedure is canceled in the cardioversion suite you will be charged a cancellation fee. Patients will be asked to: to mask in public and hand hygiene (no longer quarantine) in the 3 days prior to surgery, to report if any COVID-19-like illness or household contacts to COVID-19 to determine need for testing

## 2021-09-26 NOTE — Progress Notes (Signed)
Primary Care Physician: Lavone Orn, MD Referring Physician: Dr. Laurann Montana EP: Dr. Lerry Paterson Baby is a 75 y.o. male with a h/o MVR in 2014 and atrial flutter ablation by Dr. Rayann Heman in 2018. He waas noted at his physical with PCP to be out of rhythm and was referred here by Dr. Rayann Heman. He was started on DOAC by PCP. Eliquis 5 mg bid since last Saturday, first full day. CHA2DS2VASc  score of at least 3.  He stated that he had missed some does of metoprolol and had drank more alcohol over Christmas. EKG shows aflutter at 129 bpm. He feels fatigued and has exertional dyspnea. He does admit to snoring, frequent nocturnal voiding's and daytime somnolence.   Today, he denies symptoms of palpitations, chest pain, shortness of breath, orthopnea, PND, lower extremity edema, dizziness, presyncope, syncope, or neurologic sequela. The patient is tolerating medications without difficulties and is otherwise without complaint today.   Past Medical History:  Diagnosis Date   Barrett esophagus 2002   Erectile dysfunction    GERD (gastroesophageal reflux disease)    Hyperlipidemia    Hypertension    Pneumonia 2000   hospitalized   S/P mitral valve repair 01/06/2013   Complex valvuloplasty including triangular resection of flail segment of posterior leaflet, artificial Gore-tex neocord placement x4 and 32 mm Sorin Memo 3D ring annuloplasty via right mini thoracotomy   Severe mitral regurgitation    Past Surgical History:  Procedure Laterality Date   A-FLUTTER ABLATION N/A 08/26/2017   Procedure: A-FLUTTER ABLATION;  Surgeon: Thompson Grayer, MD;  Location: [place] CV LAB;  Service: Cardiovascular;  Laterality: N/A;   CARDIAC CATHETERIZATION     CARDIOVERSION N/A 07/01/2017   Procedure: CARDIOVERSION;  Surgeon: Lelon Perla, MD;  Location: [facility] ENDOSCOPY;  Service: Cardiovascular;  Laterality: N/A;   COLONOSCOPY     INTRAOPERATIVE TRANSESOPHAGEAL ECHOCARDIOGRAM N/A 01/06/2013   Procedure:  INTRAOPERATIVE TRANSESOPHAGEAL ECHOCARDIOGRAM;  Surgeon: Rexene Alberts, MD;  Location: [place];  Service: Open Heart Surgery;  Laterality: N/A;   MITRAL VALVE REPAIR Right 01/06/2013   Procedure: MINIMALLY INVASIVE MITRAL VALVE REPAIR (MVR);  Surgeon: Rexene Alberts, MD;  Location: [place];  Service: Open Heart Surgery;  Laterality: Right;   TEE WITHOUT CARDIOVERSION N/A 12/03/2012   Procedure: TRANSESOPHAGEAL ECHOCARDIOGRAM (TEE);  Surgeon: Candee Furbish, MD;  Location: [facility] ENDOSCOPY;  Service: Cardiovascular;  Laterality: N/A;  h/p in file cabinet-ja   TEE WITHOUT CARDIOVERSION N/A 07/01/2017   Procedure: TRANSESOPHAGEAL ECHOCARDIOGRAM (TEE);  Surgeon: Lelon Perla, MD;  Location: [facility] ENDOSCOPY;  Service: Cardiovascular;  Laterality: N/A;    Current Outpatient Medications  Medication Sig Dispense Refill   apixaban (ELIQUIS) 5 MG TABS tablet Take 5 mg by mouth 2 (two) times daily.     atorvastatin (LIPITOR) 20 MG tablet Take 20 mg every evening by mouth.      Cholecalciferol (VITAMIN D3 PO) Take 1 tablet by mouth as needed.     Coenzyme Q10 (CO Q-10) 300 MG CAPS Take 300 mg daily by mouth.      Cyanocobalamin (VITAMIN B12 PO) Take 1 tablet by mouth as needed.     fish oil-omega-3 fatty acids 1000 MG capsule Take 1 g by mouth daily.      fluticasone (FLONASE) 50 MCG/ACT nasal spray Place 2 sprays into both nostrils daily as needed for allergies or rhinitis.      MAGNESIUM PO Take 1 tablet by mouth as needed.     metoprolol succinate (  TOPROL-XL) 50 MG 24 hr tablet TAKE 1 TABLET BY MOUTH TWICE DAILY AFTER MEALS. Please keep upcoming appt in February 2023 with Cardiologist before anymore refills. Thank you 180 tablet 0   Multiple Vitamin (MULTIVITAMIN WITH MINERALS) TABS Take 1 tablet by mouth daily. Centrum Silver     NON FORMULARY Taking Elderberry-  chews 2 gummies by mouth as needed     NON FORMULARY Zinc- Taking one tablet by mouth twice weekly     omeprazole (PRILOSEC OTC) 20 MG tablet Take 20  mg every evening by mouth.      Propylene Glycol-Glycerin (MOISTURE EYES OP) Apply 1 drop daily as needed to eye (dry eyes).     sildenafil (VIAGRA) 50 MG tablet Take 50 mg by mouth as needed for erectile dysfunction (take as directed).      No current facility-administered medications for this encounter.    Allergies  Allergen Reactions   Iodinated Contrast Media Itching and Swelling    Rash, itching back approx 6 hours after scan Facial sewlling approx 10 hours after scan MD morning after scan gave steroid shot - cleared after 2 hours    Social History   Socioeconomic History   Marital status: Married    Spouse name: Not on file   Number of children: 3   Years of education: Not on file   Highest education level: Not on file  Occupational History   Occupation: retired    Comment: Teacher, English as a foreign language  Tobacco Use   Smoking status: Never   Smokeless tobacco: Never  Vaping Use   Vaping Use: Never used  Substance and Sexual Activity   Alcohol use: Yes    Alcohol/week: 21.0 standard drinks    Types: 7 Glasses of wine, 14 Cans of beer per week   Drug use: No   Sexual activity: Not on file  Other Topics Concern   Not on file  Social History Narrative   Not on file   Social Determinants of Health   Financial Resource Strain: Not on file  Food Insecurity: Not on file  Transportation Needs: Not on file  Physical Activity: Not on file  Stress: Not on file  Social Connections: Not on file  Intimate Partner Violence: Not on file    Family History  Problem Relation Age of Onset   Hypertension Mother    COPD Mother    Heart failure Mother     ROS- All systems are reviewed and negative except as per the HPI above  Physical Exam: Vitals:   09/26/21 0833  Weight: 84.6 kg  Height: 5\' 11"  (1.803 m)   Wt Readings from Last 3 Encounters:  09/26/21 84.6 kg  09/05/20 83.5 kg  09/02/19 84.4 kg    Labs: Lab Results  Component Value Date   NA 142 08/13/2017   K 4.0  08/13/2017   CL 105 08/13/2017   CO2 21 08/13/2017   GLUCOSE 108 (H) 08/13/2017   BUN 14 08/13/2017   CREATININE 1.07 08/13/2017   CALCIUM 9.0 08/13/2017   MG 2.5 01/07/2013   Lab Results  Component Value Date   INR 0.98 01/10/2013   No results found for: CHOL, HDL, LDLCALC, TRIG   GEN- The patient is well appearing, alert and oriented x 3 today.   Head- normocephalic, atraumatic Eyes-  Sclera clear, conjunctiva pink Ears- hearing intact Oropharynx- clear Neck- supple, no JVP Lymph- no cervical lymphadenopathy Lungs- Clear to ausculation bilaterally, normal work of breathing Heart- Fast, Regular rate and rhythm,  no murmurs, rubs or gallops, PMI not laterally displaced GI- soft, NT, ND, + BS Extremities- no clubbing, cyanosis, or edema MS- no significant deformity or atrophy Skin- no rash or lesion Psych- euthymic mood, full affect Neuro- strength and sensation are intact  EKG- atrial flutter at 129 bpm, pr int 128 ms, qrs int 72 ms, qtc 509 ms   Epic records reviewed   Assessment and Plan:  1. Atypical atrial flutter  Martin Majestic out of rhythm last week, possibly due to missed BB doses and extra alcohol with the holidays.  Increase metoprolol to 75 mg bid  Make sure not to miss any anticoagulation as if he does not convert, cardioversion will be needed  I will go ahead and schedule CV 3 weeks out and if he does convert, will cancel procedure. Risk vrs benefit discussed    2. CHA2DS2VASc  of at least 3 Continue eliquis 5 mg bid Bleeding precautions discussed Reminded not to miss any doses   F/u next week, sooner if needed    Butch Penny C. Lamyah Creed, [facility] [address] [place], [place] [address] [phone]

## 2021-09-29 DIAGNOSIS — M109 Gout, unspecified: Secondary | ICD-10-CM | POA: Diagnosis not present

## 2021-10-03 ENCOUNTER — Ambulatory Visit (HOSPITAL_COMMUNITY)
Admission: RE | Admit: 2021-10-03 | Discharge: 2021-10-03 | Disposition: A | Discharge status: Home / Self Care | Payer: Medicare Other | Source: Ambulatory Visit | Attending: Physician Assistant | Admitting: Physician Assistant

## 2021-10-03 ENCOUNTER — Other Ambulatory Visit: Payer: Self-pay

## 2021-10-03 ENCOUNTER — Encounter (HOSPITAL_COMMUNITY): Payer: Self-pay | Admitting: Physician Assistant

## 2021-10-03 VITALS — BP 118/88 | HR 133 | Ht 71.0 in | Wt 183.0 lb

## 2021-10-03 DIAGNOSIS — I484 Atypical atrial flutter: Secondary | ICD-10-CM

## 2021-10-03 DIAGNOSIS — D6869 Other thrombophilia: Secondary | ICD-10-CM

## 2021-10-03 DIAGNOSIS — R0600 Dyspnea, unspecified: Secondary | ICD-10-CM | POA: Insufficient documentation

## 2021-10-03 DIAGNOSIS — R0683 Snoring: Secondary | ICD-10-CM | POA: Insufficient documentation

## 2021-10-03 DIAGNOSIS — Z7901 Long term (current) use of anticoagulants: Secondary | ICD-10-CM | POA: Insufficient documentation

## 2021-10-03 DIAGNOSIS — I4819 Other persistent atrial fibrillation: Secondary | ICD-10-CM | POA: Insufficient documentation

## 2021-10-03 LAB — CBC
HCT: 38.3 % — ABNORMAL LOW (ref 39.0–52.0)
Hemoglobin: 12.8 g/dL — ABNORMAL LOW (ref 13.0–17.0)
MCH: 33.6 pg (ref 26.0–34.0)
MCHC: 33.4 g/dL (ref 30.0–36.0)
MCV: 100.5 fL — ABNORMAL HIGH (ref 80.0–100.0)
Platelets: 488 10*3/uL — ABNORMAL HIGH (ref 150–400)
RBC: 3.81 MIL/uL — ABNORMAL LOW (ref 4.22–5.81)
RDW: 11.6 % (ref 11.5–15.5)
WBC: 8.7 10*3/uL (ref 4.0–10.5)
nRBC: 0 % (ref 0.0–0.2)

## 2021-10-03 LAB — BASIC METABOLIC PANEL
Anion gap: 9 (ref 5–15)
BUN: 21 mg/dL (ref 8–23)
CO2: 23 mmol/L (ref 22–32)
Calcium: 9.5 mg/dL (ref 8.9–10.3)
Chloride: 107 mmol/L (ref 98–111)
Creatinine, Ser: 1.02 mg/dL (ref 0.61–1.24)
GFR, Estimated: >60 mL/min (ref >60)
Glucose, Bld: 99 mg/dL (ref 70–99)
Potassium: 4.1 mmol/L (ref 3.5–5.1)
Sodium: 139 mmol/L (ref 135–145)

## 2021-10-03 MED ORDER — METOPROLOL SUCCINATE ER 50 MG PO TB24: 100.0000 mg | ORAL_TABLET | Freq: Two times a day (BID) | ORAL | 0 refills | Status: DC

## 2021-10-03 NOTE — Patient Instructions (Signed)
Increase metoprolol to 100mg  twice a day until cardioversion then reduce back to 50mg  twice a day

## 2021-10-03 NOTE — Progress Notes (Signed)
Primary Care Physician: Lavone Orn, MD Referring Physician: Dr. Laurann Montana EP: Dr. Lerry Paterson James [name] is a 76 y.o. male with a h/o MVR in 2014 and atrial flutter ablation by Dr. Rayann Heman in 2018. He waas noted at his physical with PCP to be out of rhythm and was referred here by Dr. Rayann Heman. He was started on DOAC by PCP. Eliquis 5 mg bid since last Saturday, first full day. CHA2DS2VASc  score of at least 3.  He stated that he had missed some does of metoprolol and had drank more alcohol over Christmas. EKG shows aflutter at 129 bpm. He feels fatigued and has exertional dyspnea. He does admit to snoring, frequent nocturnal voiding's and daytime somnolence. His brother and sister both have afib.   Follow up in the AF clinic 10/03/21. Patient remains in atrial flutter with rapid rates. He does have symptoms of fatigue. No missed doses of anticoagulation.   Today, he denies symptoms of palpitations, chest pain, shortness of breath, orthopnea, PND, lower extremity edema, dizziness, presyncope, syncope, or neurologic sequela. The patient is tolerating medications without difficulties and is otherwise without complaint today.   Past Medical History:  Diagnosis Date   Barrett esophagus 2002   Erectile dysfunction    GERD (gastroesophageal reflux disease)    Hyperlipidemia    Hypertension    Pneumonia 2000   hospitalized   S/P mitral valve repair 01/06/2013   Complex valvuloplasty including triangular resection of flail segment of posterior leaflet, artificial Gore-tex neocord placement x4 and 32 mm Sorin Memo 3D ring annuloplasty via right mini thoracotomy   Severe mitral regurgitation    Past Surgical History:  Procedure Laterality Date   A-FLUTTER ABLATION N/A 08/26/2017   Procedure: A-FLUTTER ABLATION;  Surgeon: Thompson Grayer, MD;  Location: [place] CV LAB;  Service: Cardiovascular;  Laterality: N/A;   CARDIAC CATHETERIZATION     CARDIOVERSION N/A 07/01/2017   Procedure: CARDIOVERSION;   Surgeon: Lelon Perla, MD;  Location: [facility] ENDOSCOPY;  Service: Cardiovascular;  Laterality: N/A;   COLONOSCOPY     INTRAOPERATIVE TRANSESOPHAGEAL ECHOCARDIOGRAM N/A 01/06/2013   Procedure: INTRAOPERATIVE TRANSESOPHAGEAL ECHOCARDIOGRAM;  Surgeon: Rexene Alberts, MD;  Location: [place];  Service: Open Heart Surgery;  Laterality: N/A;   MITRAL VALVE REPAIR Right 01/06/2013   Procedure: MINIMALLY INVASIVE MITRAL VALVE REPAIR (MVR);  Surgeon: Rexene Alberts, MD;  Location: [place];  Service: Open Heart Surgery;  Laterality: Right;   TEE WITHOUT CARDIOVERSION N/A 12/03/2012   Procedure: TRANSESOPHAGEAL ECHOCARDIOGRAM (TEE);  Surgeon: Candee Furbish, MD;  Location: [facility] ENDOSCOPY;  Service: Cardiovascular;  Laterality: N/A;  h/p in file cabinet-ja   TEE WITHOUT CARDIOVERSION N/A 07/01/2017   Procedure: TRANSESOPHAGEAL ECHOCARDIOGRAM (TEE);  Surgeon: Lelon Perla, MD;  Location: [facility] ENDOSCOPY;  Service: Cardiovascular;  Laterality: N/A;    Current Outpatient Medications  Medication Sig Dispense Refill   apixaban (ELIQUIS) 5 MG TABS tablet Take 5 mg by mouth 2 (two) times daily.     atorvastatin (LIPITOR) 20 MG tablet Take 20 mg every evening by mouth.      Cholecalciferol (VITAMIN D3 PO) Take 1 tablet by mouth as needed.     Coenzyme Q10 (CO Q-10) 300 MG CAPS Take 300 mg daily by mouth.      Cyanocobalamin (VITAMIN B12 PO) Take 1 tablet by mouth as needed.     fish oil-omega-3 fatty acids 1000 MG capsule Take 1 g by mouth daily.      fluticasone (FLONASE) 50 MCG/ACT  nasal spray Place 2 sprays into both nostrils daily as needed for allergies or rhinitis.      MAGNESIUM PO Take 1 tablet by mouth as needed.     Multiple Vitamin (MULTIVITAMIN WITH MINERALS) TABS Take 1 tablet by mouth daily. Centrum Silver     NON FORMULARY Taking Elderberry-  chews 2 gummies by mouth as needed     NON FORMULARY Zinc- Taking one tablet by mouth twice weekly     omeprazole (PRILOSEC OTC) 20 MG tablet Take 20 mg every evening  by mouth.      Propylene Glycol-Glycerin (MOISTURE EYES OP) Apply 1 drop daily as needed to eye (dry eyes).     sildenafil (VIAGRA) 50 MG tablet Take 50 mg by mouth as needed for erectile dysfunction (take as directed).      metoprolol succinate (TOPROL-XL) 50 MG 24 hr tablet Take 2 tablets (100 mg total) by mouth 2 (two) times daily. 180 tablet 0   predniSONE (DELTASONE) 20 MG tablet Take 20 mg by mouth 2 (two) times daily.     No current facility-administered medications for this encounter.    Allergies  Allergen Reactions   Iodinated Contrast Media Itching and Swelling    Rash, itching back approx 6 hours after scan Facial sewlling approx 10 hours after scan MD morning after scan gave steroid shot - cleared after 2 hours    Social History   Socioeconomic History   Marital status: Married    Spouse name: Not on file   Number of children: 3   Years of education: Not on file   Highest education level: Not on file  Occupational History   Occupation: retired    Comment: Teacher, English as a foreign language  Tobacco Use   Smoking status: Never   Smokeless tobacco: Never  Vaping Use   Vaping Use: Never used  Substance and Sexual Activity   Alcohol use: Not Currently    Alcohol/week: 21.0 standard drinks    Types: 7 Glasses of wine, 14 Cans of beer per week    Comment: stopped drinking 09/21/2021   Drug use: No   Sexual activity: Not on file  Other Topics Concern   Not on file  Social History Narrative   Not on file   Social Determinants of Health   Financial Resource Strain: Not on file  Food Insecurity: Not on file  Transportation Needs: Not on file  Physical Activity: Not on file  Stress: Not on file  Social Connections: Not on file  Intimate Partner Violence: Not on file    Family History  Problem Relation Age of Onset   Hypertension Mother    COPD Mother    Heart failure Mother     ROS- All systems are reviewed and negative except as per the HPI above  Physical  Exam: Vitals:   10/03/21 0903  BP: 118/88  Pulse: (!) 133  Weight: 83 kg  Height: 5\' 11"  (1.803 m)    Wt Readings from Last 3 Encounters:  10/03/21 83 kg  09/26/21 84.6 kg  09/05/20 83.5 kg    Labs: Lab Results  Component Value Date   NA 142 08/13/2017   K 4.0 08/13/2017   CL 105 08/13/2017   CO2 21 08/13/2017   GLUCOSE 108 (H) 08/13/2017   BUN 14 08/13/2017   CREATININE 1.07 08/13/2017   CALCIUM 9.0 08/13/2017   MG 2.5 01/07/2013   Lab Results  Component Value Date   INR 0.98 01/10/2013   No results found  for: CHOL, HDL, LDLCALC, TRIG  GEN- The patient is a well appearing elderly male, alert and oriented x 3 today.   HEENT-head normocephalic, atraumatic, sclera clear, conjunctiva pink, hearing intact, trachea midline. Lungs- Clear to ausculation bilaterally, normal work of breathing Heart- Regular rate and rhythm, tachycardia, no murmurs, rubs or gallops  GI- soft, NT, ND, + BS Extremities- no clubbing, cyanosis, or edema MS- no significant deformity or atrophy Skin- no rash or lesion Psych- euthymic mood, full affect Neuro- strength and sensation are intact   EKG- atypical atrial flutter with 2:1 block  Vent. rate 133 BPM PR interval * ms QRS duration 86 ms QT/QTcB 336/500 ms  Epic records reviewed   Assessment and Plan:  1. Atypical atrial flutter  Scheduled for DCCV 10/16/21 Check bmet/cbc today. Increase metoprolol to 100 mg BID. Decrease back to 50 mg BID post DCCV.  Continue Eliquis 5 mg BID  2. CHA2DS2VASc  of at least 3 Continue eliquis 5 mg bid    Follow up in the AF clinic post DCCV.    [facility] [address] [place], [place] [address] [phone]

## 2021-10-03 NOTE — H&P (View-Only) (Signed)
Primary Care Physician: Lavone Orn, MD Referring Physician: Dr. Laurann Montana EP: Dr. Lerry Paterson James [name] is a 76 y.o. male with a h/o MVR in 2014 and atrial flutter ablation by Dr. Rayann Heman in 2018. He waas noted at his physical with PCP to be out of rhythm and was referred here by Dr. Rayann Heman. He was started on DOAC by PCP. Eliquis 5 mg bid since last Saturday, first full day. CHA2DS2VASc  score of at least 3.  He stated that he had missed some does of metoprolol and had drank more alcohol over Christmas. EKG shows aflutter at 129 bpm. He feels fatigued and has exertional dyspnea. He does admit to snoring, frequent nocturnal voiding's and daytime somnolence. His brother and sister both have afib.   Follow up in the AF clinic 10/03/21. Patient remains in atrial flutter with rapid rates. He does have symptoms of fatigue. No missed doses of anticoagulation.   Today, he denies symptoms of palpitations, chest pain, shortness of breath, orthopnea, PND, lower extremity edema, dizziness, presyncope, syncope, or neurologic sequela. The patient is tolerating medications without difficulties and is otherwise without complaint today.   Past Medical History:  Diagnosis Date   Barrett esophagus 2002   Erectile dysfunction    GERD (gastroesophageal reflux disease)    Hyperlipidemia    Hypertension    Pneumonia 2000   hospitalized   S/P mitral valve repair 01/06/2013   Complex valvuloplasty including triangular resection of flail segment of posterior leaflet, artificial Gore-tex neocord placement x4 and 32 mm Sorin Memo 3D ring annuloplasty via right mini thoracotomy   Severe mitral regurgitation    Past Surgical History:  Procedure Laterality Date   A-FLUTTER ABLATION N/A 08/26/2017   Procedure: A-FLUTTER ABLATION;  Surgeon: Thompson Grayer, MD;  Location: [place] CV LAB;  Service: Cardiovascular;  Laterality: N/A;   CARDIAC CATHETERIZATION     CARDIOVERSION N/A 07/01/2017   Procedure: CARDIOVERSION;   Surgeon: Lelon Perla, MD;  Location: [facility] ENDOSCOPY;  Service: Cardiovascular;  Laterality: N/A;   COLONOSCOPY     INTRAOPERATIVE TRANSESOPHAGEAL ECHOCARDIOGRAM N/A 01/06/2013   Procedure: INTRAOPERATIVE TRANSESOPHAGEAL ECHOCARDIOGRAM;  Surgeon: Rexene Alberts, MD;  Location: [place];  Service: Open Heart Surgery;  Laterality: N/A;   MITRAL VALVE REPAIR Right 01/06/2013   Procedure: MINIMALLY INVASIVE MITRAL VALVE REPAIR (MVR);  Surgeon: Rexene Alberts, MD;  Location: [place];  Service: Open Heart Surgery;  Laterality: Right;   TEE WITHOUT CARDIOVERSION N/A 12/03/2012   Procedure: TRANSESOPHAGEAL ECHOCARDIOGRAM (TEE);  Surgeon: Candee Furbish, MD;  Location: [facility] ENDOSCOPY;  Service: Cardiovascular;  Laterality: N/A;  h/p in file cabinet-ja   TEE WITHOUT CARDIOVERSION N/A 07/01/2017   Procedure: TRANSESOPHAGEAL ECHOCARDIOGRAM (TEE);  Surgeon: Lelon Perla, MD;  Location: [facility] ENDOSCOPY;  Service: Cardiovascular;  Laterality: N/A;    Current Outpatient Medications  Medication Sig Dispense Refill   apixaban (ELIQUIS) 5 MG TABS tablet Take 5 mg by mouth 2 (two) times daily.     atorvastatin (LIPITOR) 20 MG tablet Take 20 mg every evening by mouth.      Cholecalciferol (VITAMIN D3 PO) Take 1 tablet by mouth as needed.     Coenzyme Q10 (CO Q-10) 300 MG CAPS Take 300 mg daily by mouth.      Cyanocobalamin (VITAMIN B12 PO) Take 1 tablet by mouth as needed.     fish oil-omega-3 fatty acids 1000 MG capsule Take 1 g by mouth daily.      fluticasone (FLONASE) 50 MCG/ACT  nasal spray Place 2 sprays into both nostrils daily as needed for allergies or rhinitis.      MAGNESIUM PO Take 1 tablet by mouth as needed.     Multiple Vitamin (MULTIVITAMIN WITH MINERALS) TABS Take 1 tablet by mouth daily. Centrum Silver     NON FORMULARY Taking Elderberry-  chews 2 gummies by mouth as needed     NON FORMULARY Zinc- Taking one tablet by mouth twice weekly     omeprazole (PRILOSEC OTC) 20 MG tablet Take 20 mg every evening  by mouth.      Propylene Glycol-Glycerin (MOISTURE EYES OP) Apply 1 drop daily as needed to eye (dry eyes).     sildenafil (VIAGRA) 50 MG tablet Take 50 mg by mouth as needed for erectile dysfunction (take as directed).      metoprolol succinate (TOPROL-XL) 50 MG 24 hr tablet Take 2 tablets (100 mg total) by mouth 2 (two) times daily. 180 tablet 0   predniSONE (DELTASONE) 20 MG tablet Take 20 mg by mouth 2 (two) times daily.     No current facility-administered medications for this encounter.    Allergies  Allergen Reactions   Iodinated Contrast Media Itching and Swelling    Rash, itching back approx 6 hours after scan Facial sewlling approx 10 hours after scan MD morning after scan gave steroid shot - cleared after 2 hours    Social History   Socioeconomic History   Marital status: Married    Spouse name: Not on file   Number of children: 3   Years of education: Not on file   Highest education level: Not on file  Occupational History   Occupation: retired    Comment: Teacher, English as a foreign language  Tobacco Use   Smoking status: Never   Smokeless tobacco: Never  Vaping Use   Vaping Use: Never used  Substance and Sexual Activity   Alcohol use: Not Currently    Alcohol/week: 21.0 standard drinks    Types: 7 Glasses of wine, 14 Cans of beer per week    Comment: stopped drinking 09/21/2021   Drug use: No   Sexual activity: Not on file  Other Topics Concern   Not on file  Social History Narrative   Not on file   Social Determinants of Health   Financial Resource Strain: Not on file  Food Insecurity: Not on file  Transportation Needs: Not on file  Physical Activity: Not on file  Stress: Not on file  Social Connections: Not on file  Intimate Partner Violence: Not on file    Family History  Problem Relation Age of Onset   Hypertension Mother    COPD Mother    Heart failure Mother     ROS- All systems are reviewed and negative except as per the HPI above  Physical  Exam: Vitals:   10/03/21 0903  BP: 118/88  Pulse: (!) 133  Weight: 83 kg  Height: 5\' 11"  (1.803 m)    Wt Readings from Last 3 Encounters:  10/03/21 83 kg  09/26/21 84.6 kg  09/05/20 83.5 kg    Labs: Lab Results  Component Value Date   NA 142 08/13/2017   K 4.0 08/13/2017   CL 105 08/13/2017   CO2 21 08/13/2017   GLUCOSE 108 (H) 08/13/2017   BUN 14 08/13/2017   CREATININE 1.07 08/13/2017   CALCIUM 9.0 08/13/2017   MG 2.5 01/07/2013   Lab Results  Component Value Date   INR 0.98 01/10/2013   No results found  for: CHOL, HDL, LDLCALC, TRIG  GEN- The patient is a well appearing elderly male, alert and oriented x 3 today.   HEENT-head normocephalic, atraumatic, sclera clear, conjunctiva pink, hearing intact, trachea midline. Lungs- Clear to ausculation bilaterally, normal work of breathing Heart- Regular rate and rhythm, tachycardia, no murmurs, rubs or gallops  GI- soft, NT, ND, + BS Extremities- no clubbing, cyanosis, or edema MS- no significant deformity or atrophy Skin- no rash or lesion Psych- euthymic mood, full affect Neuro- strength and sensation are intact   EKG- atypical atrial flutter with 2:1 block  Vent. rate 133 BPM PR interval * ms QRS duration 86 ms QT/QTcB 336/500 ms  Epic records reviewed   Assessment and Plan:  1. Atypical atrial flutter  Scheduled for DCCV 10/16/21 Check bmet/cbc today. Increase metoprolol to 100 mg BID. Decrease back to 50 mg BID post DCCV.  Continue Eliquis 5 mg BID  2. CHA2DS2VASc  of at least 3 Continue eliquis 5 mg bid    Follow up in the AF clinic post DCCV.    [facility] [address] [place], [place] [address] [phone]

## 2021-10-04 NOTE — Telephone Encounter (Signed)
Pt was seen in At Fib clinic 12/28 as scheduled.  Please see that visit note for further information.

## 2021-10-05 ENCOUNTER — Encounter (HOSPITAL_COMMUNITY): Payer: Self-pay | Admitting: Cardiology

## 2021-10-05 NOTE — Progress Notes (Signed)
Attempted to obtain medical history via telephone, unable to reach at this time. I left a voicemail to return pre surgical testing department's phone call.  

## 2021-10-08 ENCOUNTER — Telehealth (HOSPITAL_COMMUNITY): Payer: Self-pay | Admitting: *Deleted

## 2021-10-08 NOTE — Telephone Encounter (Signed)
Pt declined appt with [facility].

## 2021-10-16 ENCOUNTER — Encounter (HOSPITAL_COMMUNITY)
Admission: RE | Disposition: A | Discharge status: Home / Self Care | Payer: Self-pay | Source: Home / Self Care | Attending: Cardiology

## 2021-10-16 ENCOUNTER — Ambulatory Visit (HOSPITAL_COMMUNITY)
Admission: RE | Admit: 2021-10-16 | Discharge: 2021-10-16 | Disposition: A | Discharge status: Home / Self Care | Payer: Medicare Other | Attending: Cardiology | Admitting: Cardiology

## 2021-10-16 ENCOUNTER — Ambulatory Visit (HOSPITAL_COMMUNITY): Payer: Medicare Other | Admitting: Certified Registered Nurse Anesthetist

## 2021-10-16 ENCOUNTER — Other Ambulatory Visit: Payer: Self-pay

## 2021-10-16 ENCOUNTER — Encounter (HOSPITAL_COMMUNITY): Payer: Self-pay | Admitting: Cardiology

## 2021-10-16 DIAGNOSIS — I10 Essential (primary) hypertension: Secondary | ICD-10-CM | POA: Insufficient documentation

## 2021-10-16 DIAGNOSIS — I484 Atypical atrial flutter: Secondary | ICD-10-CM

## 2021-10-16 DIAGNOSIS — Z79899 Other long term (current) drug therapy: Secondary | ICD-10-CM | POA: Insufficient documentation

## 2021-10-16 DIAGNOSIS — K219 Gastro-esophageal reflux disease without esophagitis: Secondary | ICD-10-CM | POA: Insufficient documentation

## 2021-10-16 DIAGNOSIS — I491 Atrial premature depolarization: Secondary | ICD-10-CM | POA: Insufficient documentation

## 2021-10-16 DIAGNOSIS — Z7901 Long term (current) use of anticoagulants: Secondary | ICD-10-CM | POA: Insufficient documentation

## 2021-10-16 DIAGNOSIS — E785 Hyperlipidemia, unspecified: Secondary | ICD-10-CM | POA: Insufficient documentation

## 2021-10-16 DIAGNOSIS — I4892 Unspecified atrial flutter: Secondary | ICD-10-CM | POA: Insufficient documentation

## 2021-10-16 DIAGNOSIS — I4891 Unspecified atrial fibrillation: Secondary | ICD-10-CM | POA: Diagnosis not present

## 2021-10-16 HISTORY — PX: CARDIOVERSION: SHX1299

## 2021-10-16 SURGERY — CARDIOVERSION
Anesthesia: General

## 2021-10-16 MED ORDER — METOPROLOL SUCCINATE ER 50 MG PO TB24: 50.0000 mg | ORAL_TABLET | Freq: Two times a day (BID) | ORAL | 0 refills | Status: DC

## 2021-10-16 MED ORDER — PROPOFOL 10 MG/ML IV BOLUS
INTRAVENOUS | Status: DC | PRN
Start: 2021-10-16 — End: 2021-10-16
  Administered 2021-10-16: 20 mg via INTRAVENOUS
  Administered 2021-10-16: 80 mg via INTRAVENOUS

## 2021-10-16 MED ORDER — PHENYLEPHRINE 40 MCG/ML (10ML) SYRINGE FOR IV PUSH (FOR BLOOD PRESSURE SUPPORT)
PREFILLED_SYRINGE | INTRAVENOUS | Status: DC | PRN
  Administered 2021-10-16 (×3): 80 ug via INTRAVENOUS
  Administered 2021-10-16: 40 ug via INTRAVENOUS

## 2021-10-16 MED ORDER — LIDOCAINE 2% (20 MG/ML) 5 ML SYRINGE
INTRAMUSCULAR | Status: DC | PRN
Start: 2021-10-16 — End: 2021-10-16
  Administered 2021-10-16: 20 mg via INTRAVENOUS

## 2021-10-16 MED ORDER — SODIUM CHLORIDE 0.9 % IV SOLN: INTRAVENOUS | Status: DC

## 2021-10-16 NOTE — CV Procedure (Signed)
° ° °  Electrical Cardioversion Procedure Note James [name] [id number] August 07, 1946  Procedure: Electrical Cardioversion Indications:  Atrial Flutter  Time Out: Verified patient identification, verified procedure,medications/allergies/relevent history reviewed, required imaging and test results available.  Performed  Procedure Details  The patient was NPO after midnight. Anesthesia was administered at the beside  by Dr.Amier Ola Spurr with propofol.  Cardioversion was performed with synchronized biphasic defibrillation via AP pads with 120 joules.  1 attempt(s) were performed.  The patient converted to normal sinus rhythm. The patient tolerated the procedure well   IMPRESSION:  Successful cardioversion of atrial flutter    James [name] 10/16/2021, 10:47 AM

## 2021-10-16 NOTE — Anesthesia Procedure Notes (Signed)
Procedure Name: General with mask airway Date/Time: 10/16/2021 10:42 AM Performed by: Janene Harvey, [name] Pre-anesthesia Checklist: Patient identified, Emergency Drugs available, Suction available and Patient being monitored Patient Re-evaluated:Patient Re-evaluated prior to induction Oxygen Delivery Method: Ambu bag Preoxygenation: Pre-oxygenation with 100% oxygen Induction Type: IV induction Placement Confirmation: positive ETCO2 Dental Injury: Teeth and Oropharynx as per pre-operative assessment

## 2021-10-16 NOTE — Transfer of Care (Signed)
Immediate Anesthesia Transfer of Care Note  Patient: James [name]  Procedure(s) Performed: CARDIOVERSION  Patient Location: Endoscopy Unit  Anesthesia Type:General  Level of Consciousness: awake and alert   Airway & Oxygen Therapy: Patient Spontanous Breathing  Post-op Assessment: Report given to RN and Post -op Vital signs reviewed and stable  Post vital signs: Reviewed and stable  Last Vitals:  Vitals Value Taken Time  BP 103/70 10/16/21 1058  Temp 36.2 C 10/16/21 1054  Pulse 57 10/16/21 1057  Resp 14 10/16/21 1057  SpO2 99 % 10/16/21 1057  Vitals shown include unvalidated device data.  Last Pain:  Vitals:   10/16/21 1054  TempSrc: Temporal  PainSc: 0-No pain         Complications: No notable events documented.

## 2021-10-16 NOTE — Anesthesia Preprocedure Evaluation (Addendum)
Anesthesia Evaluation  Patient identified by MRN, date of birth, ID band Patient awake    Reviewed: Allergy & Precautions, NPO status , Patient's Chart, lab work & pertinent test results  Airway Mallampati: II  TM Distance: >3 FB Neck ROM: Full    Dental   Pulmonary neg pulmonary ROS,    breath sounds clear to auscultation       Cardiovascular hypertension, Pt. on medications and Pt. on home beta blockers + dysrhythmias + Valvular Problems/Murmurs (s/p MV repair)  Rhythm:Irregular Rate:Normal     Neuro/Psych negative neurological ROS     GI/Hepatic Neg liver ROS, GERD  ,  Endo/Other  negative endocrine ROS  Renal/GU negative Renal ROS     Musculoskeletal   Abdominal   Peds  Hematology negative hematology ROS (+)   Anesthesia Other Findings   Reproductive/Obstetrics                            Anesthesia Physical Anesthesia Plan  ASA: 3  Anesthesia Plan: General   Post-op Pain Management: Minimal or no pain anticipated   Induction: Intravenous  PONV Risk Score and Plan: 2 and Treatment may vary due to age or medical condition  Airway Management Planned: Mask and Natural Airway  Additional Equipment: None  Intra-op Plan:   Post-operative Plan:   Informed Consent: I have reviewed the patients History and Physical, chart, labs and discussed the procedure including the risks, benefits and alternatives for the proposed anesthesia with the patient or authorized representative who has indicated his/her understanding and acceptance.       Plan Discussed with:   Anesthesia Plan Comments:         Anesthesia Quick Evaluation

## 2021-10-16 NOTE — Interval H&P Note (Signed)
History and Physical Interval Note:  10/16/2021 10:14 AM  James [name]  has presented today for surgery, with the diagnosis of AFIB.  The various methods of treatment have been discussed with the patient and family. After consideration of risks, benefits and other options for treatment, the patient has consented to  Procedure(s): CARDIOVERSION (N/A) as a surgical intervention.  The patient's history has been reviewed, patient examined, no change in status, stable for surgery.  I have reviewed the patient's chart and labs.  Questions were answered to the patient's satisfaction.     [employer]

## 2021-10-16 NOTE — Anesthesia Postprocedure Evaluation (Signed)
Anesthesia Post Note  Patient: James [name]  Procedure(s) Performed: CARDIOVERSION     Patient location during evaluation: PACU Anesthesia Type: General Level of consciousness: awake and alert Pain management: pain level controlled Vital Signs Assessment: post-procedure vital signs reviewed and stable Respiratory status: spontaneous breathing, nonlabored ventilation, respiratory function stable and patient connected to nasal cannula oxygen Cardiovascular status: blood pressure returned to baseline and stable Postop Assessment: no apparent nausea or vomiting Anesthetic complications: no   No notable events documented.  Last Vitals:  Vitals:   10/16/21 1129 10/16/21 1138  BP: (!) 97/58 103/60  Pulse: (!) 59 60  Resp: 20 (!) 9  Temp:    SpO2: 97% 100%    Last Pain:  Vitals:   10/16/21 1138  TempSrc:   PainSc: 0-No pain                 Tiajuana Amass

## 2021-10-18 ENCOUNTER — Encounter (HOSPITAL_COMMUNITY): Payer: Self-pay | Admitting: Cardiology

## 2021-10-24 ENCOUNTER — Other Ambulatory Visit: Payer: Self-pay

## 2021-10-24 ENCOUNTER — Encounter (HOSPITAL_COMMUNITY): Payer: Self-pay | Admitting: Nurse Practitioner

## 2021-10-24 ENCOUNTER — Ambulatory Visit (HOSPITAL_COMMUNITY)
Admission: RE | Admit: 2021-10-24 | Discharge: 2021-10-24 | Disposition: A | Discharge status: Home / Self Care | Payer: Medicare Other | Source: Ambulatory Visit | Attending: Nurse Practitioner | Admitting: Nurse Practitioner

## 2021-10-24 VITALS — BP 144/90 | HR 69 | Ht 71.0 in | Wt 183.4 lb

## 2021-10-24 DIAGNOSIS — I484 Atypical atrial flutter: Secondary | ICD-10-CM | POA: Diagnosis not present

## 2021-10-24 DIAGNOSIS — D6869 Other thrombophilia: Secondary | ICD-10-CM | POA: Diagnosis not present

## 2021-10-24 NOTE — Progress Notes (Addendum)
Primary Care Physician: Lavone Orn, MD Referring Physician: Dr. Laurann [place] EP: Dr. Rayann Heman  Cardiologist: Dr. Druscilla Brownie Bonfield is a 76 y.o. male with a h/o MVR in 2014 and atrial flutter ablation by Dr. Rayann Heman in 2018. He waas noted at his physical with PCP to be out of rhythm and was referred here by Dr. Rayann Heman. He was started on DOAC by PCP. Eliquis 5 mg bid since last Saturday, first full day. CHA2DS2VASc  score of at least 3.  He stated that he had missed some does of metoprolol and had drank more alcohol over Christmas. EKG shows aflutter at 129 bpm. He feels fatigued and has exertional dyspnea. He does admit to snoring, frequent nocturnal voiding's and daytime somnolence.   F/u afib clinic, 10/24/21. He had a successful cardioversion, 1/17,  and remains in SR today. He does describe to me, Christmas Eve, having the same chest tightness for several hours. The chest discomfort felt just like the discomfort that he had driving home form the beach in November,  which was dx per pt as costochondritis. He took aleve and the symptoms resolved after 3-4 hours. He said it was  a very sharp pain and hurt with deep breathing. He has not had any further episodes since then. He is pending an appointment with Richardson Dopp, [place], 2/15.   Discussed that he needs to avoid alcohol as a big contributer to afib and make sure not to miss doses of BB, both of which happened over the holidays and may have been the trigger for Afib.   Today, he denies symptoms of palpitations, chest pain, shortness of breath, orthopnea, PND, lower extremity edema, dizziness, presyncope, syncope, or neurologic sequela. The patient is tolerating medications without difficulties and is otherwise without complaint today.   Past Medical History:  Diagnosis Date   Barrett esophagus 2002   Erectile dysfunction    GERD (gastroesophageal reflux disease)    Hyperlipidemia    Hypertension    Pneumonia 2000   hospitalized   S/P  mitral valve repair 01/06/2013   Complex valvuloplasty including triangular resection of flail segment of posterior leaflet, artificial Gore-tex neocord placement x4 and 32 mm Sorin Memo 3D ring annuloplasty via right mini thoracotomy   Severe mitral regurgitation    Past Surgical History:  Procedure Laterality Date   A-FLUTTER ABLATION N/A 08/26/2017   Procedure: A-FLUTTER ABLATION;  Surgeon: Thompson Grayer, MD;  Location: [place] CV LAB;  Service: Cardiovascular;  Laterality: N/A;   CARDIAC CATHETERIZATION     CARDIOVERSION N/A 07/01/2017   Procedure: CARDIOVERSION;  Surgeon: Lelon Perla, MD;  Location: [facility] ENDOSCOPY;  Service: Cardiovascular;  Laterality: N/A;   CARDIOVERSION N/A 10/16/2021   Procedure: CARDIOVERSION;  Surgeon: Jerline Pain, MD;  Location: [place];  Service: Cardiovascular;  Laterality: N/A;   COLONOSCOPY     INTRAOPERATIVE TRANSESOPHAGEAL ECHOCARDIOGRAM N/A 01/06/2013   Procedure: INTRAOPERATIVE TRANSESOPHAGEAL ECHOCARDIOGRAM;  Surgeon: Rexene Alberts, MD;  Location: [place];  Service: Open Heart Surgery;  Laterality: N/A;   MITRAL VALVE REPAIR Right 01/06/2013   Procedure: MINIMALLY INVASIVE MITRAL VALVE REPAIR (MVR);  Surgeon: Rexene Alberts, MD;  Location: [place];  Service: Open Heart Surgery;  Laterality: Right;   TEE WITHOUT CARDIOVERSION N/A 12/03/2012   Procedure: TRANSESOPHAGEAL ECHOCARDIOGRAM (TEE);  Surgeon: Candee Furbish, MD;  Location: [facility] ENDOSCOPY;  Service: Cardiovascular;  Laterality: N/A;  h/p in file cabinet-ja   TEE WITHOUT CARDIOVERSION N/A 07/01/2017   Procedure: TRANSESOPHAGEAL ECHOCARDIOGRAM (TEE);  Surgeon: Lelon Perla, MD;  Location: [facility] ENDOSCOPY;  Service: Cardiovascular;  Laterality: N/A;    Current Outpatient Medications  Medication Sig Dispense Refill   apixaban (ELIQUIS) 5 MG TABS tablet Take 5 mg by mouth 2 (two) times daily.     atorvastatin (LIPITOR) 20 MG tablet Take 20 mg by mouth at bedtime.     Cholecalciferol (VITAMIN D3 PO)  Take 1 tablet by mouth 2 (two) times a week.     Coenzyme Q10 (CO Q-10) 100 MG CAPS Take 100 mg by mouth daily.     fish oil-omega-3 fatty acids 1000 MG capsule Take 1 g by mouth daily.      fluticasone (FLONASE) 50 MCG/ACT nasal spray Place 2 sprays into both nostrils daily as needed for allergies or rhinitis.      MAGNESIUM PO Take 1 tablet by mouth 2 (two) times daily.     metoprolol succinate (TOPROL-XL) 50 MG 24 hr tablet Take 1 tablet (50 mg total) by mouth 2 (two) times daily. 180 tablet 0   Multiple Vitamin (MULTIVITAMIN WITH MINERALS) TABS Take 1 tablet by mouth daily. Centrum Silver     Multiple Vitamins-Minerals (ZINC PO) Take 1 tablet by mouth 3 (three) times a week.     NON FORMULARY Take 1 tablet by mouth 3 (three) times a week. Gummies     omeprazole (PRILOSEC OTC) 20 MG tablet Take 20 mg every evening by mouth.      sildenafil (VIAGRA) 50 MG tablet Take 50 mg by mouth as needed for erectile dysfunction (take as directed).      No current facility-administered medications for this encounter.    Allergies  Allergen Reactions   Iodinated Contrast Media Itching and Swelling    Rash, itching back approx 6 hours after scan Facial sewlling approx 10 hours after scan MD morning after scan gave steroid shot - cleared after 2 hours    Social History   Socioeconomic History   Marital status: Married    Spouse name: Not on file   Number of children: 3   Years of education: Not on file   Highest education level: Not on file  Occupational History   Occupation: retired    Comment: Teacher, English as a foreign language  Tobacco Use   Smoking status: Never   Smokeless tobacco: Never  Vaping Use   Vaping Use: Never used  Substance and Sexual Activity   Alcohol use: Not Currently    Alcohol/week: 21.0 standard drinks    Types: 7 Glasses of wine, 14 Cans of beer per week    Comment: stopped drinking 09/21/2021   Drug use: No   Sexual activity: Not on file  Other Topics Concern   Not on file   Social History Narrative   Not on file   Social Determinants of Health   Financial Resource Strain: Not on file  Food Insecurity: Not on file  Transportation Needs: Not on file  Physical Activity: Not on file  Stress: Not on file  Social Connections: Not on file  Intimate Partner Violence: Not on file    Family History  Problem Relation Age of Onset   Hypertension Mother    COPD Mother    Heart failure Mother     ROS- All systems are reviewed and negative except as per the HPI above  Physical Exam: Vitals:   10/24/21 0930  BP: (!) 144/90  Pulse: 69  Weight: 83.2 kg  Height: 5\' 11"  (1.803 m)   Wt Readings from  Last 3 Encounters:  10/24/21 83.2 kg  10/16/21 82.6 kg  10/03/21 83 kg    Labs: Lab Results  Component Value Date   NA 139 10/03/2021   K 4.1 10/03/2021   CL 107 10/03/2021   CO2 23 10/03/2021   GLUCOSE 99 10/03/2021   BUN 21 10/03/2021   CREATININE 1.02 10/03/2021   CALCIUM 9.5 10/03/2021   MG 2.5 01/07/2013   Lab Results  Component Value Date   INR 0.98 01/10/2013   No results found for: CHOL, HDL, LDLCALC, TRIG   GEN- The patient is well appearing, alert and oriented x 3 today.   Head- normocephalic, atraumatic Eyes-  Sclera clear, conjunctiva pink Ears- hearing intact Oropharynx- clear Neck- supple, no JVP Lymph- no cervical lymphadenopathy Lungs- Clear to ausculation bilaterally, normal work of breathing Heart- Fast, Regular rate and rhythm, no murmurs, rubs or gallops, PMI not laterally displaced GI- soft, NT, ND, + BS Extremities- no clubbing, cyanosis, or edema MS- no significant deformity or atrophy Skin- no rash or lesion Psych- euthymic mood, full affect Neuro- strength and sensation are intact  EKG-sinus rhythm with PAC's, pr int 208 ms, qrs int 72 ms, qtc 424 ms   Epic records reviewed   Assessment and Plan:  1. Atypical atrial flutter  Martin Majestic out of rhythm mid December , possibly due to missed BB doses and extra alcohol  with the holidays.  Continue metoprolol  50 mg bid   2. CHA2DS2VASc  of at least 3 Continue eliquis 5 mg bid  3. CHest pain  2 episodes within one month, one episode treated for in [facility]) None since Christmas Eve Pt is blaming on costochondritis  If occurs again, to go to the ER Has appointment pending soon in Cardiology and can be evaluated to see if further cardiac testing is needed   F/u  2/15 with Nicki Reaper and afib clinic as needed    [place]. Rehan Holness, [facility] [address] [place], [place] [address] [phone]

## 2021-11-13 NOTE — Progress Notes (Signed)
Office Visit    Patient Name: James [name] Date of Encounter: 11/14/2021  PCP:  Lavone Orn, [place]  Cardiologist:  Candee Furbish, MD  Advanced Practice Provider:  No care team member to display Electrophysiologist:  None    Chief Complaint    James [name] is a 76 y.o. male with a hx of atrial flutter post ablation as well as MVR by Dr. Roxy Manns in 2014, hypertension, hyperlipidemia presents today for follow-up appointment.  The patient was last seen December 2021 and was overall doing well at that time.  He had no further recurrence of atrial flutter and was on metoprolol succinate 50 mg twice a day.  He was off anticoagulation.  More recently he was seen at the A-fib clinic on October 24, 2021.  He was noted at his physical with his primary care to be out of rhythm and was referred to Dr. Rayann Heman.  He was started on DOAC by his PCP.  Eliquis 5 mg was initiated.  CHA2DS2-VASc score of at least 3.  EKG at that time showed a flutter rate 129 bpm.  He had a successful cardioversion 10/16/2021 and remained in normal sinus rhythm at the time of his appointment.  He had not had any further episodes since over the holidays.  It was discussed that he needs to avoid alcohol as this is a big contributor to atrial fibrillation and that he cannot miss a dose of his beta-blocker.  Today, he is doing well today.  He shares that he had gout in his left foot and was put on some steroids.  This has resolved.  He also had some chest pain back in November.  He describes this as feeling like there was a weight on his chest.  When he went to [facility] ER they checked him out right away and he did not have a heart attack.  They drew labs and did an EKG and everything was negative.  They gave him some pain medication and diagnosed him with chest wall pain.  He had a similar situation occur over Christmas.  He was doing a lot of heavy lifting and went to a shooting range and was  shooting multiple heavy guns.  Again, has chest wall pain and this was relieved with Advil.  He did not go to the ED this time around.  He stays physically active by walking a lot, hiking, and playing pickle ball.  He has never had any symptoms while doing these activities.  He enjoys going to the mountains with his family and they have a house in [place] that they enjoy staying at about half the year.  His atrial fibrillation has been well controlled since his DCCV.  He has not had any further episodes that he is aware of.  Reports no shortness of breath nor dyspnea on exertion. Reports no chest pain, pressure, or tightness. No edema, orthopnea, PND. Reports no palpitations.     Past Medical History    Past Medical History:  Diagnosis Date   Barrett esophagus 2002   Erectile dysfunction    GERD (gastroesophageal reflux disease)    Hyperlipidemia    Hypertension    Pneumonia 2000   hospitalized   S/P mitral valve repair 01/06/2013   Complex valvuloplasty including triangular resection of flail segment of posterior leaflet, artificial Gore-tex neocord placement x4 and 32 mm Sorin Memo 3D ring annuloplasty via right mini thoracotomy   Severe mitral regurgitation  Past Surgical History:  Procedure Laterality Date   A-FLUTTER ABLATION N/A 08/26/2017   Procedure: A-FLUTTER ABLATION;  Surgeon: Thompson Grayer, MD;  Location: [place] CV LAB;  Service: Cardiovascular;  Laterality: N/A;   CARDIAC CATHETERIZATION     CARDIOVERSION N/A 07/01/2017   Procedure: CARDIOVERSION;  Surgeon: Lelon Perla, MD;  Location[facility] ENDOSCOPY;  Service: Cardiovascular;  Laterality: N/A;   CARDIOVERSION N/A 10/16/2021   Procedure: CARDIOVERSION;  Surgeon: Jerline Pain, MD;  Location: [place];  Service: Cardiovascular;  Laterality: N/A;   COLONOSCOPY     INTRAOPERATIVE TRANSESOPHAGEAL ECHOCARDIOGRAM N/A 01/06/2013   Procedure: INTRAOPERATIVE TRANSESOPHAGEAL ECHOCARDIOGRAM;  Surgeon: Rexene Alberts, MD;   Location: [place];  Service: Open Heart Surgery;  Laterality: N/A;   MITRAL VALVE REPAIR Right 01/06/2013   Procedure: MINIMALLY INVASIVE MITRAL VALVE REPAIR (MVR);  Surgeon: Rexene Alberts, MD;  Location: [place];  Service: Open Heart Surgery;  Laterality: Right;   TEE WITHOUT CARDIOVERSION N/A 12/03/2012   Procedure: TRANSESOPHAGEAL ECHOCARDIOGRAM (TEE);  Surgeon: Candee Furbish, MD;  Location: [facility] ENDOSCOPY;  Service: Cardiovascular;  Laterality: N/A;  h/p in file cabinet-ja   TEE WITHOUT CARDIOVERSION N/A 07/01/2017   Procedure: TRANSESOPHAGEAL ECHOCARDIOGRAM (TEE);  Surgeon: Lelon Perla, MD;  Location: [facility] ENDOSCOPY;  Service: Cardiovascular;  Laterality: N/A;    Allergies  Allergies  Allergen Reactions   Iodinated Contrast Media Itching and Swelling    Rash, itching back approx 6 hours after scan Facial sewlling approx 10 hours after scan MD morning after scan gave steroid shot - cleared after 2 hours    EKGs/Labs/Other Studies Reviewed:   The following studies were reviewed today:  Echocardiogram 10/04/2020  IMPRESSIONS     1. Left ventricular ejection fraction, by estimation, is 65 to 70%. The  left ventricle has normal function. The left ventricle has no regional  wall motion abnormalities. Left ventricular diastolic parameters are  consistent with Grade I diastolic  dysfunction (impaired relaxation).   2. Right ventricular systolic function is normal. The right ventricular  size is mildly enlarged.   3. Left atrial size was severely dilated.   4. The mitral valve has been repaired/replaced. Trivial mitral valve  regurgitation. No evidence of mitral stenosis. There is a 32 mm prosthetic  annuloplasty ring present in the mitral position.   5. The aortic valve is tricuspid. Aortic valve regurgitation is not  visualized. Mild aortic valve sclerosis is present, with no evidence of  aortic valve stenosis.   6. Aortic dilatation noted. There is mild dilatation of the ascending   aorta, measuring 41 mm.   7. The inferior vena cava is normal in size with greater than 50%  respiratory variability, suggesting right atrial pressure of 3 mmHg.   EKG:  EKG is  ordered today.  The ekg ordered today demonstrates NSR rate 76 bpm  Recent Labs: 10/03/2021: BUN 21; Creatinine, Ser 1.02; Hemoglobin 12.8; Platelets 488; Potassium 4.1; Sodium 139  Recent Lipid Panel No results found for: CHOL, TRIG, HDL, CHOLHDL, VLDL, LDLCALC, LDLDIRECT  Home Medications   Current Meds  Medication Sig   apixaban (ELIQUIS) 5 MG TABS tablet Take 5 mg by mouth 2 (two) times daily.   atorvastatin (LIPITOR) 20 MG tablet Take 20 mg by mouth at bedtime.   Cholecalciferol (VITAMIN D3 PO) Take 1 tablet by mouth 2 (two) times a week.   Coenzyme Q10 (CO Q-10) 100 MG CAPS Take 100 mg by mouth daily.   fish oil-omega-3 fatty acids 1000 MG  capsule Take 1 g by mouth daily.    fluticasone (FLONASE) 50 MCG/ACT nasal spray Place 2 sprays into both nostrils daily as needed for allergies or rhinitis.    MAGNESIUM PO Take 1 tablet by mouth 2 (two) times daily.   Metoprolol Succinate 100 MG CS24 Take 1 tablet by mouth daily.   Multiple Vitamin (MULTIVITAMIN WITH MINERALS) TABS Take 1 tablet by mouth daily. Centrum Silver   Multiple Vitamins-Minerals (ZINC PO) Take 1 tablet by mouth 3 (three) times a week.   omeprazole (PRILOSEC OTC) 20 MG tablet Take 20 mg every evening by mouth.    sildenafil (VIAGRA) 50 MG tablet Take 50 mg by mouth as needed for erectile dysfunction (take as directed).      Review of Systems      All other systems reviewed and are otherwise negative except as noted above.  Physical Exam    VS:  BP 130/76 (BP Location: Right Arm, Patient Position: Sitting, Cuff Size: Normal)    Pulse 76    Ht 5\' 11"  (1.803 m)    Wt 185 lb 9.6 oz (84.2 kg)    SpO2 97%    BMI 25.89 kg/m  , BMI Body mass index is 25.89 kg/m.  Wt Readings from Last 3 Encounters:  11/14/21 185 lb 9.6 oz (84.2 kg)  10/24/21  183 lb 6.4 oz (83.2 kg)  10/16/21 182 lb (82.6 kg)     GEN: Well nourished, well developed, in no acute distress. HEENT: normal. Neck: Supple, no JVD, carotid bruits, or masses. Cardiac: RRR, no murmurs, rubs, or gallops. No clubbing, cyanosis, edema.  Radials/PT 2+ and equal bilaterally.  Respiratory:  Respirations regular and unlabored, clear to auscultation bilaterally. GI: Soft, nontender, nondistended. MS: No deformity or atrophy. Skin: Warm and dry, no rash. Neuro:  Strength and sensation are intact. Psych: Normal affect.  Assessment & Plan    Atrial flutter -125 bpm before DCCV, asymptomatic -now he is on 100mg  metoprolol succinate daily -76 beats per minute today which is what it usually is at home -He remains on Eliquis without any signs or symptoms of bleeding CHA2DS2-VASc Score = 4  The patient's score is based upon: CHF History: 0 HTN History: 1 Diabetes History: 0 Stroke History: 0 Vascular Disease History: 1 Age Score: 2 Gender Score: 0   Aortic root dilatation 41 mm -echocardiogram ordered today for surveillance   Status post mitral valve repair -No issues -Echocardiogram ordered   Chest pain -Seemed MS in nature -resolved with Ibuprofen and has not reoccurred -Had a full work-up back in Nov which was negative for ischemia  Hyperlipidemia -last lipid panel 12/22 LDL 100, triglycerides 119 -His PCP continues to look after his cholesterol  -Continue Lipitor 20mg  daily   Disposition: Follow up 1 year with Candee Furbish, MD or APP.  Signed, Elgie Collard, PA-C 11/14/2021, 11:30 AM [place]

## 2021-11-14 ENCOUNTER — Encounter: Payer: Self-pay | Admitting: Physician Assistant

## 2021-11-14 ENCOUNTER — Other Ambulatory Visit: Payer: Self-pay

## 2021-11-14 ENCOUNTER — Ambulatory Visit: Payer: Medicare Other | Admitting: Physician Assistant

## 2021-11-14 VITALS — BP 130/76 | HR 76 | Ht 71.0 in | Wt 185.6 lb

## 2021-11-14 DIAGNOSIS — E782 Mixed hyperlipidemia: Secondary | ICD-10-CM | POA: Diagnosis not present

## 2021-11-14 DIAGNOSIS — Z9889 Other specified postprocedural states: Secondary | ICD-10-CM | POA: Diagnosis not present

## 2021-11-14 DIAGNOSIS — I7121 Aneurysm of the ascending aorta, without rupture: Secondary | ICD-10-CM

## 2021-11-14 DIAGNOSIS — R079 Chest pain, unspecified: Secondary | ICD-10-CM | POA: Diagnosis not present

## 2021-11-14 DIAGNOSIS — I484 Atypical atrial flutter: Secondary | ICD-10-CM

## 2021-11-14 NOTE — Patient Instructions (Signed)
Medication Instructions:   Your physician recommends that you continue on your current medications as directed. Please refer to the Current Medication list given to you today.   *If you need a refill on your cardiac medications before your next appointment, please call your pharmacy*   Testing/Procedures:  Your physician has requested that you have an echocardiogram. Echocardiography is a painless test that uses sound waves to create images of your heart. It provides your doctor with information about the size and shape of your heart and how well your hearts chambers and valves are working. This procedure takes approximately one hour. There are no restrictions for this procedure.    Follow-Up: At [facility], you and your health needs are our priority.  As part of our continuing mission to provide you with exceptional heart care, we have created designated Provider Care Teams.  These Care Teams include your primary Cardiologist (physician) and Advanced Practice Providers (APPs -  Physician Assistants and Nurse Practitioners) who all work together to provide you with the care you need, when you need it.  We recommend signing up for the patient portal called "MyChart".  Sign up information is provided on this After Visit Summary.  MyChart is used to connect with patients for Virtual Visits (Telemedicine).  Patients are able to view lab/test results, encounter notes, upcoming appointments, etc.  Non-urgent messages can be sent to your provider as well.   To learn more about what you can do with MyChart, go to [email or web address].    Your next appointment:   1 year(s)  The format for your next appointment:   In Person  Provider:   Candee Furbish, MD     Other Instructions  Your physician wants you to follow-up in: 1 year with Dr.Skains.  You will receive a reminder letter in the mail two months in advance. If you don't receive a letter, please call our office to schedule the  follow-up appointment.

## 2021-11-16 NOTE — Addendum Note (Signed)
Addended by: Janan Halter F on: 11/16/2021 07:43 AM   Modules accepted: Orders

## 2021-11-19 NOTE — Addendum Note (Signed)
Addended by: Briant Cedar on: 11/19/2021 02:46 PM   Modules accepted: Orders

## 2021-11-21 NOTE — Addendum Note (Signed)
Addended by: Briant Cedar on: 11/21/2021 12:16 PM   Modules accepted: Orders

## 2021-11-23 ENCOUNTER — Other Ambulatory Visit: Payer: Self-pay | Admitting: Internal Medicine

## 2021-11-23 ENCOUNTER — Ambulatory Visit
Admission: RE | Admit: 2021-11-23 | Discharge: 2021-11-23 | Disposition: A | Discharge status: Home / Self Care | Payer: Medicare Other | Source: Ambulatory Visit | Attending: Internal Medicine | Admitting: Internal Medicine

## 2021-11-23 DIAGNOSIS — R600 Localized edema: Secondary | ICD-10-CM | POA: Diagnosis not present

## 2021-11-23 DIAGNOSIS — M79672 Pain in left foot: Secondary | ICD-10-CM

## 2021-12-04 ENCOUNTER — Other Ambulatory Visit: Payer: Self-pay

## 2021-12-04 ENCOUNTER — Ambulatory Visit (HOSPITAL_COMMUNITY): Payer: Medicare Other | Attending: Cardiovascular Disease

## 2021-12-04 DIAGNOSIS — I7121 Aneurysm of the ascending aorta, without rupture: Secondary | ICD-10-CM | POA: Diagnosis not present

## 2021-12-04 DIAGNOSIS — I484 Atypical atrial flutter: Secondary | ICD-10-CM | POA: Diagnosis not present

## 2021-12-04 DIAGNOSIS — Z9889 Other specified postprocedural states: Secondary | ICD-10-CM | POA: Insufficient documentation

## 2021-12-04 DIAGNOSIS — R079 Chest pain, unspecified: Secondary | ICD-10-CM | POA: Diagnosis not present

## 2021-12-04 DIAGNOSIS — E782 Mixed hyperlipidemia: Secondary | ICD-10-CM | POA: Diagnosis not present

## 2021-12-04 LAB — ECHOCARDIOGRAM COMPLETE
Area-P 1/2: 3.03 cm2
MV VTI: 2.11 cm2
S' Lateral: 3.4 cm

## 2021-12-05 DIAGNOSIS — M79672 Pain in left foot: Secondary | ICD-10-CM | POA: Diagnosis not present

## 2022-01-09 DIAGNOSIS — M10071 Idiopathic gout, right ankle and foot: Secondary | ICD-10-CM | POA: Diagnosis not present

## 2022-02-20 DIAGNOSIS — M109 Gout, unspecified: Secondary | ICD-10-CM | POA: Diagnosis not present

## 2022-08-09 DIAGNOSIS — H2513 Age-related nuclear cataract, bilateral: Secondary | ICD-10-CM | POA: Diagnosis not present

## 2022-08-09 DIAGNOSIS — H40013 Open angle with borderline findings, low risk, bilateral: Secondary | ICD-10-CM | POA: Diagnosis not present

## 2022-08-09 DIAGNOSIS — H25013 Cortical age-related cataract, bilateral: Secondary | ICD-10-CM | POA: Diagnosis not present

## 2022-08-27 DIAGNOSIS — K219 Gastro-esophageal reflux disease without esophagitis: Secondary | ICD-10-CM | POA: Diagnosis not present

## 2022-09-18 DIAGNOSIS — M109 Gout, unspecified: Secondary | ICD-10-CM | POA: Diagnosis not present

## 2022-09-18 DIAGNOSIS — I483 Typical atrial flutter: Secondary | ICD-10-CM | POA: Diagnosis not present

## 2022-09-18 DIAGNOSIS — I10 Essential (primary) hypertension: Secondary | ICD-10-CM | POA: Diagnosis not present

## 2022-11-07 ENCOUNTER — Encounter (HOSPITAL_COMMUNITY): Payer: Self-pay | Admitting: *Deleted

## 2022-12-16 DIAGNOSIS — R339 Retention of urine, unspecified: Secondary | ICD-10-CM | POA: Diagnosis not present

## 2022-12-16 DIAGNOSIS — M1A9XX0 Chronic gout, unspecified, without tophus (tophi): Secondary | ICD-10-CM | POA: Diagnosis not present

## 2022-12-16 DIAGNOSIS — R1013 Epigastric pain: Secondary | ICD-10-CM | POA: Diagnosis not present

## 2022-12-16 DIAGNOSIS — R1084 Generalized abdominal pain: Secondary | ICD-10-CM | POA: Diagnosis not present

## 2022-12-16 DIAGNOSIS — I10 Essential (primary) hypertension: Secondary | ICD-10-CM | POA: Diagnosis not present

## 2022-12-16 DIAGNOSIS — E7800 Pure hypercholesterolemia, unspecified: Secondary | ICD-10-CM | POA: Diagnosis not present

## 2022-12-16 DIAGNOSIS — K828 Other specified diseases of gallbladder: Secondary | ICD-10-CM | POA: Diagnosis not present

## 2022-12-16 DIAGNOSIS — K219 Gastro-esophageal reflux disease without esophagitis: Secondary | ICD-10-CM | POA: Diagnosis not present

## 2022-12-16 DIAGNOSIS — D539 Nutritional anemia, unspecified: Secondary | ICD-10-CM | POA: Diagnosis not present

## 2022-12-16 DIAGNOSIS — K838 Other specified diseases of biliary tract: Secondary | ICD-10-CM | POA: Diagnosis not present

## 2022-12-16 DIAGNOSIS — K811 Chronic cholecystitis: Secondary | ICD-10-CM | POA: Diagnosis not present

## 2022-12-16 DIAGNOSIS — E860 Dehydration: Secondary | ICD-10-CM | POA: Diagnosis not present

## 2022-12-16 DIAGNOSIS — K851 Biliary acute pancreatitis without necrosis or infection: Secondary | ICD-10-CM | POA: Diagnosis not present

## 2022-12-16 DIAGNOSIS — K5900 Constipation, unspecified: Secondary | ICD-10-CM | POA: Diagnosis not present

## 2022-12-16 DIAGNOSIS — K8010 Calculus of gallbladder with chronic cholecystitis without obstruction: Secondary | ICD-10-CM | POA: Diagnosis not present

## 2022-12-16 DIAGNOSIS — K859 Acute pancreatitis without necrosis or infection, unspecified: Secondary | ICD-10-CM | POA: Diagnosis not present

## 2022-12-16 DIAGNOSIS — I959 Hypotension, unspecified: Secondary | ICD-10-CM | POA: Diagnosis not present

## 2022-12-16 DIAGNOSIS — R140 Abdominal distension (gaseous): Secondary | ICD-10-CM | POA: Diagnosis not present

## 2022-12-16 DIAGNOSIS — Z7901 Long term (current) use of anticoagulants: Secondary | ICD-10-CM | POA: Diagnosis not present

## 2022-12-16 DIAGNOSIS — E861 Hypovolemia: Secondary | ICD-10-CM | POA: Diagnosis not present

## 2022-12-16 DIAGNOSIS — I4892 Unspecified atrial flutter: Secondary | ICD-10-CM | POA: Diagnosis not present

## 2022-12-16 DIAGNOSIS — R109 Unspecified abdominal pain: Secondary | ICD-10-CM | POA: Diagnosis not present

## 2022-12-16 DIAGNOSIS — E785 Hyperlipidemia, unspecified: Secondary | ICD-10-CM | POA: Diagnosis not present

## 2022-12-16 DIAGNOSIS — Z79899 Other long term (current) drug therapy: Secondary | ICD-10-CM | POA: Diagnosis not present

## 2022-12-16 DIAGNOSIS — Z91041 Radiographic dye allergy status: Secondary | ICD-10-CM | POA: Diagnosis not present

## 2022-12-16 DIAGNOSIS — F1090 Alcohol use, unspecified, uncomplicated: Secondary | ICD-10-CM | POA: Diagnosis not present

## 2022-12-16 DIAGNOSIS — Z888 Allergy status to other drugs, medicaments and biological substances status: Secondary | ICD-10-CM | POA: Diagnosis not present

## 2022-12-16 DIAGNOSIS — Z952 Presence of prosthetic heart valve: Secondary | ICD-10-CM | POA: Diagnosis not present

## 2022-12-16 DIAGNOSIS — K2270 Barrett's esophagus without dysplasia: Secondary | ICD-10-CM | POA: Diagnosis not present

## 2022-12-16 DIAGNOSIS — R1110 Vomiting, unspecified: Secondary | ICD-10-CM | POA: Diagnosis not present

## 2022-12-16 DIAGNOSIS — I341 Nonrheumatic mitral (valve) prolapse: Secondary | ICD-10-CM | POA: Diagnosis not present

## 2022-12-16 DIAGNOSIS — Z743 Need for continuous supervision: Secondary | ICD-10-CM | POA: Diagnosis not present

## 2023-01-03 DIAGNOSIS — I484 Atypical atrial flutter: Secondary | ICD-10-CM | POA: Diagnosis not present

## 2023-01-03 DIAGNOSIS — Z8719 Personal history of other diseases of the digestive system: Secondary | ICD-10-CM | POA: Diagnosis not present

## 2023-01-03 DIAGNOSIS — Z9049 Acquired absence of other specified parts of digestive tract: Secondary | ICD-10-CM | POA: Diagnosis not present

## 2023-01-03 DIAGNOSIS — I10 Essential (primary) hypertension: Secondary | ICD-10-CM | POA: Diagnosis not present

## 2023-01-13 ENCOUNTER — Telehealth: Payer: Self-pay | Admitting: Cardiology

## 2023-01-13 ENCOUNTER — Telehealth: Payer: Self-pay | Admitting: *Deleted

## 2023-01-13 NOTE — Telephone Encounter (Signed)
Attempted to contact pt at # in phone number.  Call went to voicemail.  Attempted to leave message to call back.  Call was disconnected during the time the message was being left.  Will attempt to contact again.

## 2023-01-13 NOTE — Telephone Encounter (Signed)
Patient c/o Palpitations:  High priority if patient c/o lightheadedness, shortness of breath, or chest pain  How long have you had palpitations/irregular HR/ Afib? Are you having the symptoms now? Pt is not sure how long he's had the irregular heart beat... he hasn't had any pain. States when he checks his bp with his blood pressure cuff, it'll tell him if his heart beat is irregular or not, and he states he has seen that for over a week now.   Are you currently experiencing lightheadedness, SOB or CP? Lightheaded from time to time. No other pain.   Do you have a history of afib (atrial fibrillation) or irregular heart rhythm? He states yes, since he was put on eliquis.   Have you checked your BP or HR? (document readings if available):   122/77 - today 125/80 - today 130/85 - today (Before Telmisartan) 145/90  Are you experiencing any other symptoms? No  Pt had gallbladder surg 3 wks ago. States he has had an irregular heart beat. He is unsure if normal post surgery. States he saw pcp 1 wk ago, his bp was slightly high. His PCP put him on Telmisartan to bring bp down, and it has. He states his PCP didn't mention irregular heart beat. States he saw an NP over the weekend, and said she isn't sure if its a-fib or a-flutter but that he needed to call his cardiologist.  Scheduled pt with Tereso Newcomer PA on 4/23.  Call transferred to triage.

## 2023-01-13 NOTE — Telephone Encounter (Signed)
Patient was returning call. Please advise ?

## 2023-01-13 NOTE — Telephone Encounter (Signed)
James [name] A7 minutes ago (12:40 PM)   SS Patient was returning call. Please advise       Note   James [name], James "Rob" [phone]  James [name] A8 minutes ago (12:40 PM)   You30 minutes ago (12:18 PM)    Attempted to contact pt at # in phone number.  Call went to voicemail.  Attempted to leave message to call back.  Call was disconnected during the time the message was being left.  Will attempt to contact again.      Note    You routed conversation to You52 minutes ago (11:56 AM)    Jeralyn Ruths M routed conversation to [facility] [address] Triage1 hour ago (11:35 AM)   [name], [name]1 hour ago (11:35 AM)    Patient c/o Palpitations:  High priority if patient c/o lightheadedness, shortness of breath, or chest pain   How long have you had palpitations/irregular HR/ Afib? Are you having the symptoms now? Pt is not sure how long he's had the irregular heart beat... he hasn't had any pain. States when he checks his bp with his blood pressure cuff, it'll tell him if his heart beat is irregular or not, and he states he has seen that for over a week now.    Are you currently experiencing lightheadedness, SOB or CP? Lightheaded from time to time. No other pain.    Do you have a history of afib (atrial fibrillation) or irregular heart rhythm? He states yes, since he was put on eliquis.    Have you checked your BP or HR? (document readings if available):        122/77 - today 125/80 - today 130/85 - today (Before Telmisartan) 145/90   Are you experiencing any other symptoms? No   Pt had gallbladder surg 3 wks ago. States he has had an irregular heart beat. He is unsure if normal post surgery. States he saw pcp 1 wk ago, his bp was slightly high. His PCP put him on Telmisartan to bring bp down, and it has. He states his PCP didn't mention irregular heart beat. States he saw an NP over the weekend, and said she isn't sure if its a-fib or a-flutter but that he needed to call his  cardiologist.   Scheduled pt with Tereso Newcomer PA on 4/23.   Call transferred to triage.       Note   James [name], James "Rob" [phone]  Jeralyn Ruths M     Prior telephone was closed Spoke with pt who is reporting he recently had his gall bladder removed and has been doing well since.  Approximately 10 days ago he saw his PCP (Dr Orson Aloe) and BP was elevated.  It was discovered he had been instructed to d/c his telmisartan at d/c after surgery. Reason this is unknown.   He had been taking 80 mg daily.  His PCP advised him to restart it but at 40 mg daily.  BP has improved per pt's report.  Pt has noticed since checking his BP his monitor has shown an irregular HR.  He reports "feeling fine" and HR has been between 72 to 85 bpm.  Occasionally he may feel a little weakness/fatigue and a few times some lightheadedness.  Nothing currently.  Pt has a known HX: at flutter.  Pt has been taking his Eliquis as ordered. Advised pt to continue to monitor s/s and vital signs.  He is scheduled to see his PCP again in f/u on Friday.  He is scheduled to f/u here with Tereso Newcomer, PA.  01/22/23.  Pt will keep that appt has instructed and call back prior to then if any other questions, needs, or s/s.  He was appreciative of the call back and information.  I will forward this to Dr Anne Fu for his knowledge.

## 2023-01-17 NOTE — Telephone Encounter (Signed)
Let's order Zio-2 weeks, atrial flutter Donato Schultz, MD    Pt has appt with Tereso Newcomer, PA Tuesday 4/23.

## 2023-01-17 NOTE — Telephone Encounter (Signed)
Left message for pt that EKG will be obtained on Tuesday at his appt.

## 2023-01-21 ENCOUNTER — Encounter: Payer: Self-pay | Admitting: Physician Assistant

## 2023-01-21 ENCOUNTER — Ambulatory Visit: Payer: Medicare Other | Attending: Physician Assistant | Admitting: Cardiology

## 2023-01-21 VITALS — BP 100/70 | HR 114 | Ht 71.0 in | Wt 187.2 lb

## 2023-01-21 DIAGNOSIS — Z9889 Other specified postprocedural states: Secondary | ICD-10-CM

## 2023-01-21 DIAGNOSIS — I10 Essential (primary) hypertension: Secondary | ICD-10-CM

## 2023-01-21 DIAGNOSIS — E782 Mixed hyperlipidemia: Secondary | ICD-10-CM | POA: Diagnosis not present

## 2023-01-21 DIAGNOSIS — I480 Paroxysmal atrial fibrillation: Secondary | ICD-10-CM | POA: Insufficient documentation

## 2023-01-21 DIAGNOSIS — E781 Pure hyperglyceridemia: Secondary | ICD-10-CM | POA: Diagnosis not present

## 2023-01-21 DIAGNOSIS — I77810 Thoracic aortic ectasia: Secondary | ICD-10-CM

## 2023-01-21 DIAGNOSIS — I484 Atypical atrial flutter: Secondary | ICD-10-CM

## 2023-01-21 MED ORDER — TELMISARTAN 20 MG PO TABS: 20.0000 mg | ORAL_TABLET | Freq: Every day | ORAL | 3 refills | Status: DC

## 2023-01-21 NOTE — H&P (View-Only) (Signed)
Cardiology Office Note:    Date:  01/21/2023   ID:  James [name], DOB 09/26/1946, MRN [id number]  PCP:  Henderson, Jonathan A, MD   [facility] Providers Cardiologist:  Mark Skains, MD    Referring MD: Griffin, John, MD   Chief Complaint  Patient presents with   Irregular Heart Beat   Follow-up    Annual Follow Up. Aflutter.     History of Present Illness:    James [name] is a 76 y.o. male with a hx of atrial flutter s/p ablation in 2018, history of mitral valve regurgitation s/p mitral valve repair in 2014, GERD, Barrett's esophagus, hyperlipidemia, hypertension.  Patient is followed by Dr. Skains and presents today for their annual follow-up.  In 2014, patient was found to have severe mitral valve regurgitation/prolapse and underwent minimally invasive mitral valve repair on 01/06/13.  Postoperatively, patient did have brief postoperative atrial fibrillation, but patient spontaneously converted back to sinus rhythm.  Later in 2018, patient had an abrupt onset of palpitations.  He was seen by Dr. Klein and was found to be in atrial flutter with 2:1 conduction.  He had a TEE on 07/01/2017 that showed EF 55-60%, no regional wall motion abnormalities, s/p mitral valve repair with mild MR, severe left atrial enlargement.  There were no thrombi noted, and patient underwent successful DCCV with conversion to NSR.  Later underwent atrial flutter ablation on 08/26/17.  Post ablation, patient did very well for a few years until 08/2021 when patient had recurrence of a flutter in the setting of increased alcohol use and missed doses of metoprolol.  Patient had a DCCV on 10/16/2021 with conversion to normal sinus rhythm.  Echocardiogram on 12/04/2021 showed EF 55-60%, no regional wall motion abnormalities, grade 2 diastolic dysfunction, normal RV systolic function, severe left atrial enlargement, mild dilation of the ascending aorta measuring 41 mm.  The mitral valve had been repaired and there  was only trivial regurgitation.  Patient was recently admitted to [facility] f=in 11/2021 for treatment of acute pancreatitis.  He was treated with aggressive IV fluids and supportive care.  Also found to have biliary sludge and underwent cholecystectomy. He did have to hold his eliquis prior to surgery, but has been taking his eliquis uninterrupted since 3/15.   Today, patient reports that he has been doing well since he was discharged from the hospital.  When he was in the hospital, it was noted that his blood pressure was up a bit low.  He was instructed to stop taking his telmisartan.  He was seen by his primary care provider about 2 weeks ago and his blood pressure was elevated at that time.  He was instructed to resume taking telmisartan 40 mg daily.  Since then, he has noticed that his blood pressure has been low at home.  He checks his blood pressure multiple times a day, and his systolic blood pressure has been in the 100s.  He has had occasional episodes of lightheadedness that mostly occur when he goes from sitting to standing.  Denies syncope, near syncope.  When checking his blood pressure, he has noted that his pulse has been irregular.  His pulse has been in the 60s-80s at home, but has been irregular every time he is checked his blood pressure for the past couple of days.  Patient denies any palpitations, shortness of breath, chest pain, ankle edema.  Reports that he has been compliant with his Eliquis and has not missed a dose   since being discharged from the hospital on 12/13/2022.   Past Medical History:  Diagnosis Date   Barrett esophagus 2002   Erectile dysfunction    GERD (gastroesophageal reflux disease)    Hyperlipidemia    Hypertension    Pneumonia 2000   hospitalized   S/P mitral valve repair 01/06/2013   Complex valvuloplasty including triangular resection of flail segment of posterior leaflet, artificial Gore-tex neocord placement x4 and 32 mm Sorin Memo 3D ring  annuloplasty via right mini thoracotomy   Severe mitral regurgitation     Past Surgical History:  Procedure Laterality Date   A-FLUTTER ABLATION N/A 08/26/2017   Procedure: A-FLUTTER ABLATION;  Surgeon: Allred, James, MD;  Location: [facility];  Service: Cardiovascular;  Laterality: N/A;   CARDIAC CATHETERIZATION     CARDIOVERSION N/A 07/01/2017   Procedure: CARDIOVERSION;  Surgeon: Crenshaw, Brian S, MD;  Location: [facility] ENDOSCOPY;  Service: Cardiovascular;  Laterality: N/A;   CARDIOVERSION N/A 10/16/2021   Procedure: CARDIOVERSION;  Surgeon: Skains, Mark C, MD;  Location: [facility] ENDOSCOPY;  Service: Cardiovascular;  Laterality: N/A;   COLONOSCOPY     INTRAOPERATIVE TRANSESOPHAGEAL ECHOCARDIOGRAM N/A 01/06/2013   Procedure: INTRAOPERATIVE TRANSESOPHAGEAL ECHOCARDIOGRAM;  Surgeon: Clarence H Owen, MD;  Location: [facility] OR;  Service: Open Heart Surgery;  Laterality: N/A;   MITRAL VALVE REPAIR Right 01/06/2013   Procedure: MINIMALLY INVASIVE MITRAL VALVE REPAIR (MVR);  Surgeon: Clarence H Owen, MD;  Location: [facility] OR;  Service: Open Heart Surgery;  Laterality: Right;   TEE WITHOUT CARDIOVERSION N/A 12/03/2012   Procedure: TRANSESOPHAGEAL ECHOCARDIOGRAM (TEE);  Surgeon: Mark Skains, MD;  Location: [facility] ENDOS[facility];  Service: Cardiovascular;  Laterality: N/A;  h/p in file cabinet-ja   TEE WITHOUT CARDIOVERSION N/A 07/01/2017   Procedure: TRANSESOPHAGEAL ECHOCARDIOGRAM (TEE);  Surgeon: Crenshaw, Brian S, MD;  Location: [facility] ENDOS[facility];  Service: Cardiovascular;  Laterality: N/A;    Current Medications: Current Meds  Medication Sig   apixaban (ELIQUIS) 5 MG TABS tablet Take 5 mg by mouth 2 (two) times daily.   atorvastatin (LIPITOR) 20 MG tablet Take 20 mg by mouth at bedtime.   Cholecalciferol (VITAMIN D3 PO) Take 1 tablet by mouth 2 (two) times a week.   Coenzyme Q10 (CO Q-10) 100 MG CAPS Take 100 mg by mouth daily.   fish oil-omega-3 fatty acids 1000 MG capsule Take 1 g by mouth daily.    fluticasone  (FLONASE) 50 MCG/ACT nasal spray Place 2 sprays into both nostrils daily as needed for allergies or rhinitis.    MAGNESIUM PO Take 1 tablet by mouth 2 (two) times daily.   Metoprolol Succinate 100 MG CS24 Take 1 tablet by mouth daily.   Multiple Vitamin (MULTIVITAMIN WITH MINERALS) TABS Take 1 tablet by mouth daily. Centrum Silver   Multiple Vitamins-Minerals (ZINC PO) Take 1 tablet by mouth 3 (three) times a week.   omeprazole (PRILOSEC OTC) 20 MG tablet Take 20 mg every evening by mouth.    sildenafil (VIAGRA) 50 MG tablet Take 50 mg by mouth as needed for erectile dysfunction (take as directed).    tamsulosin (FLOMAX) 0.4 MG CAPS capsule Take 1 capsule by mouth daily.   telmisartan (MICARDIS) 20 MG tablet Take 1 tablet (20 mg total) by mouth daily.   [DISCONTINUED] telmisartan (MICARDIS) 40 MG tablet Take 40 mg by mouth at bedtime.     Allergies:   Iodinated contrast media and Iodine   Social History   Socioeconomic History   Marital status: Married    Spouse name: Not   on file   Number of children: 3   Years of education: Not on file   Highest education level: Not on file  Occupational History   Occupation: retired    Comment: sales [employer]  Tobacco Use   Smoking status: Never   Smokeless tobacco: Never  Vaping Use   Vaping Use: Never used  Substance and Sexual Activity   Alcohol use: Not Currently    Alcohol/week: 21.0 standard drinks of alcohol    Types: 7 Glasses of wine, 14 Cans of beer per week    Comment: stopped drinking 09/21/2021   Drug use: No   Sexual activity: Not on file  Other Topics Concern   Not on file  Social History Narrative   Not on file   Social Determinants of Health   Financial Resource Strain: Not on file  Food Insecurity: Not on file  Transportation Needs: Not on file  Physical Activity: Not on file  Stress: Not on file  Social Connections: Not on file     Family History: The patient's family history includes COPD in his mother;  Heart failure in his mother; Hypertension in his mother.  ROS:   Please see the history of present illness.    All other systems reviewed and are negative.  EKGs/Labs/Other Studies Reviewed:    The following studies were reviewed today: Cardiac Studies & Procedures       ECHOCARDIOGRAM  ECHOCARDIOGRAM COMPLETE 12/04/2021  Narrative ECHOCARDIOGRAM REPORT    Patient Name:   James [name] Date of Exam: 12/04/2021 Medical Rec #:  [id number]     Height:       71.0 in Accession #:    [id number]    Weight:       185.6 lb Date of Birth:  11/09/1945    BSA:          2.043 m Patient Age:    75 years      BP:           130/76 mmHg Patient Gender: M             HR:           77 bpm. Exam Location:  [address]  Procedure: 2D Echo, 3D Echo, Cardiac Doppler and Color Doppler  Indications:    Ascending Aortic Aneurysm I71  History:        Patient has prior history of Echocardiogram examinations, most recent 10/04/2020. Risk Factors:Dyslipidemia.  Mitral Valve: 32 mm Sorin Memo 3D ring annuloplasty valve is present in the mitral position. Procedure Date: 01/06/2013.  Sonographer:    Casey Kirkpatrick RDCS Referring Phys: [id number] TESSA N CONTE  IMPRESSIONS   1. Left ventricular ejection fraction, by estimation, is 55 to 60%. Left ventricular ejection fraction by 3D volume is 58 %. The left ventricle has normal function. The left ventricle has no regional wall motion abnormalities. The left ventricular internal cavity size was mildly dilated. Left ventricular diastolic parameters are consistent with Grade II diastolic dysfunction (pseudonormalization). 2. Right ventricular systolic function is normal. The right ventricular size is normal. 3. Left atrial size was severely dilated. 4. The mitral valve has been repaired/replaced. Trivial mitral valve regurgitation. There is a 32 mm Sorin Memo 3D ring annuloplasty present in the mitral position. Procedure Date: 01/06/2013. 5. The aortic valve is  grossly normal. Aortic valve regurgitation is trivial. 6. Aortic dilatation noted. There is mild dilatation of the ascending aorta, measuring 41 mm.  FINDINGS Left Ventricle: Left ventricular ejection fraction,   by estimation, is 55 to 60%. Left ventricular ejection fraction by 3D volume is 58 %. The left ventricle has normal function. The left ventricle has no regional wall motion abnormalities. The left ventricular internal cavity size was mildly dilated. There is no left ventricular hypertrophy. Left ventricular diastolic parameters are consistent with Grade II diastolic dysfunction (pseudonormalization).  Right Ventricle: The right ventricular size is normal. Right vetricular wall thickness was not well visualized. Right ventricular systolic function is normal.  Left Atrium: Left atrial size was severely dilated.  Right Atrium: Right atrial size was normal in size.  Pericardium: There is no evidence of pericardial effusion.  Mitral Valve: The mitral valve has been repaired/replaced. Trivial mitral valve regurgitation. There is a 32 mm Sorin Memo 3D ring annuloplasty present in the mitral position. Procedure Date: 01/06/2013. MV peak gradient, 4.8 mmHg. The mean mitral valve gradient is 2.0 mmHg.  Tricuspid Valve: The tricuspid valve is not well visualized. Tricuspid valve regurgitation is trivial.  Aortic Valve: The aortic valve is grossly normal. Aortic valve regurgitation is trivial.  Pulmonic Valve: The pulmonic valve was not well visualized. Pulmonic valve regurgitation is not visualized.  Aorta: Aortic dilatation noted. There is mild dilatation of the ascending aorta, measuring 41 mm.  IAS/Shunts: The atrial septum is grossly normal.   LEFT VENTRICLE PLAX 2D LVIDd:         5.40 cm         Diastology LVIDs:         3.40 cm         LV e' medial:    6.60 cm/s LV PW:         1.00 cm         LV E/e' medial:  14.7 LV IVS:        1.00 cm         LV e' lateral:   6.75 cm/s LVOT diam:      2.00 cm         LV E/e' lateral: 14.4 LV SV:         65 LV SV Index:   32 LVOT Area:     3.14 cm        3D Volume EF LV 3D EF:    Left ventricul ar ejection fraction by 3D volume is 58 %.  3D Volume EF: 3D EF:        58 % LV EDV:       147 ml LV ESV:       62 ml LV SV:        85 ml  RIGHT VENTRICLE RV S prime:     6.75 cm/s TAPSE (M-mode): 2.4 cm  LEFT ATRIUM              Index        RIGHT ATRIUM           Index LA diam:        3.60 cm  1.76 cm/m   RA Area:     17.40 cm LA Vol (A2C):   114.0 ml 55.81 ml/m  RA Volume:   43.70 ml  21.39 ml/m LA Vol (A4C):   94.7 ml  46.36 ml/m LA Biplane Vol: 105.0 ml 51.40 ml/m AORTIC VALVE LVOT Vmax:   94.30 cm/s LVOT Vmean:  63.400 cm/s LVOT VTI:    0.207 m  AORTA Ao Root diam: 3.90 cm Ao Asc diam:  4.10 cm  MITRAL VALVE MV Area (PHT): 3.03 cm       SHUNTS MV Area VTI:   2.11 cm     Systemic VTI:  0.21 m MV Peak grad:  4.8 mmHg     Systemic Diam: 2.00 cm MV Mean grad:  2.0 mmHg MV Vmax:       1.09 m/s MV Vmean:      74.0 cm/s MV Decel Time: 250 msec MV E velocity: 96.90 cm/s MV A velocity: 108.00 cm/s MV E/A ratio:  0.90  Philip Nahser MD Electronically signed by Philip Nahser MD Signature Date/Time: 12/04/2021/2:33:01 PM    Final   TEE  ECHO TEE 07/01/2017  Narrative *[facility]* * [facility]* [address] [place], [place] [address] [phone]  ------------------------------------------------------------------- Transesophageal Echocardiography  Patient:    [name], James MR #:       [id number] Study Date: 07/01/2017 Gender:     M Age:        70 Height:     188 cm Weight:     123.8 kg BSA:        2.58 m^2 Pt. Status: Room:  ADMITTING    Brian Crenshaw ATTENDING    Brian Crenshaw ORDERING     Brian Crenshaw PERFORMING   Brian Crenshaw REFERRING    Brian Crenshaw SONOGRAPHER  Rachel Foster  cc:  ------------------------------------------------------------------- LV  EF: 55% -   60%  ------------------------------------------------------------------- Indications:      Atrial fibrillation - 427.31.  ------------------------------------------------------------------- Study Conclusions  - Left ventricle: Systolic function was normal. The estimated ejection fraction was in the range of 55% to 60%. Wall motion was normal; there were no regional wall motion abnormalities. - Aortic valve: No evidence of vegetation. - Mitral valve: Prior procedures included surgical repair. There was mild regurgitation. - Left atrium: The atrium was severely dilated. No evidence of thrombus in the atrial cavity or appendage. - Right atrium: No evidence of thrombus in the atrial cavity or appendage. - Atrial septum: No defect or patent foramen ovale was identified. - Tricuspid valve: No evidence of vegetation. - Pulmonic valve: No evidence of vegetation. - Pericardium, extracardiac: A trivial pericardial effusion was identified.  Impressions:  - Normal LV function; s/p MV repair with mean gradient 4 mmHg and mild MR; severe LAE; mild TR.  ------------------------------------------------------------------- Labs, prior tests, procedures, and surgery: Mitral Valve Repair.  ------------------------------------------------------------------- Study data:   Study status:  Routine.  Consent:  The risks, benefits, and alternatives to the procedure were explained to the patient and informed consent was obtained.  Procedure:  The patient reported no pain pre or post test. Initial setup. The patient was brought to the laboratory. Surface ECG leads were monitored. Sedation. Deep sedation was administered by anesthesiology staff. Transesophageal echocardiography. An adult multiplane transesophageal probe was inserted by the attending cardiologistwithout difficulty. Image quality was adequate.  Study completion:  The patient tolerated the procedure well. There were no  complications.          Diagnostic transesophageal echocardiography.  2D and color Doppler.  Birthdate:  Patient birthdate: 11/22/1945.  Age:  Patient is 77 yr old.  Sex:  Gender: male.    BMI: 35.1 kg/m^2.  Blood pressure:     99/61  Patient status:  Outpatient.  Study date:  Study date: 07/01/2017. Study time: 11:04 AM.  Location:  Endoscopy.  -------------------------------------------------------------------  ------------------------------------------------------------------- Left ventricle:  Systolic function was normal. The estimated ejection fraction was in the range of 55% to 60%. Wall motion was normal; there were no regional wall motion abnormalities.  ------------------------------------------------------------------- Aortic valve:     Structurally normal valve. Trileaflet. Cusp separation was normal.  No evidence of vegetation.  Doppler:  There was no regurgitation.  ------------------------------------------------------------------- Aorta:  Descending aorta: The descending aorta had mild diffuse disease.  ------------------------------------------------------------------- Mitral valve:  Prior procedures included surgical repair.  Doppler: There was mild regurgitation.    Mean gradient (D): 4 mm Hg.  ------------------------------------------------------------------- Left atrium:  The atrium was severely dilated.  No evidence of thrombus in the atrial cavity or appendage.  ------------------------------------------------------------------- Atrial septum:  No defect or patent foramen ovale was identified.  ------------------------------------------------------------------- Right ventricle:  The cavity size was normal. Systolic function was normal.  ------------------------------------------------------------------- Pulmonic valve:    Structurally normal valve.   Cusp separation was normal.  No evidence of vegetation.  Doppler:  There was  trivial regurgitation.  ------------------------------------------------------------------- Tricuspid valve:   Structurally normal valve.   Leaflet separation was normal.  No evidence of vegetation.  Doppler:  There was mild regurgitation.  ------------------------------------------------------------------- Right atrium:  The atrium was normal in size.  No evidence of thrombus in the atrial cavity or appendage.  ------------------------------------------------------------------- Pericardium:  A trivial pericardial effusion was identified.  ------------------------------------------------------------------- Measurements  Mitral valve                 Value Mitral mean velocity, D      87.9  cm/s Mitral mean gradient, D      4     mm Hg Mitral annulus VTI, D        13.3  cm  Legend: (L)  and  (H)  mark values outside specified reference range.  ------------------------------------------------------------------- Prepared and Electronically Authenticated by  Brian Crenshaw 2018-10-02T17:49:37             EKG:  EKG is ordered today.  The ekg ordered today demonstrates Atrial Fibrillation, HR 114 BPM.   Recent Labs: No results found for requested labs within last 365 days.  Recent Lipid Panel No results found for: "CHOL", "TRIG", "HDL", "CHOLHDL", "VLDL", "LDLCALC", "LDLDIRECT"   Risk Assessment/Calculations:    CHA2DS2-VASc Score = 3  This indicates a 3.2% annual risk of stroke. The patient's score is based upon: CHF History: 0 HTN History: 1 Diabetes History: 0 Stroke History: 0 Vascular Disease History: 0 Age Score: 2 Gender Score: 0       Physical Exam:    VS:  BP 100/70   Pulse (!) 114   Ht 5' 11" (1.803 m)   Wt 187 lb 3.2 oz (84.9 kg)   SpO2 94%   BMI 26.11 kg/m     Wt Readings from Last 3 Encounters:  01/21/23 187 lb 3.2 oz (84.9 kg)  11/14/21 185 lb 9.6 oz (84.2 kg)  10/24/21 183 lb 6.4 oz (83.2 kg)     GEN: Elderly male, sitting comfortably  on the exam table. Well nourished, well developed in no acute distress HEENT: Normal NECK: No JVD; No carotid bruits  CARDIAC: Irregular rate and rhythm, no murmurs, rubs, gallops. Radial pulses 2+ bilaterally  RESPIRATORY:  Clear to auscultation without rales, wheezing or rhonchi. Normal work of breathing on room air  ABDOMEN: Soft, non-tender, non-distended MUSCULOSKELETAL:  No edema in BLE; No deformity  SKIN: Warm and dry NEUROLOGIC:  Alert and oriented x 3 PSYCHIATRIC:  Normal affect   ASSESSMENT:    1. Atypical atrial flutter   2. Paroxysmal atrial fibrillation   3. S/P mitral valve repair   4. Mild dilation of ascending aorta   5. Primary hypertension   6. Mixed   hyperlipidemia   7. Hypertriglyceridemia    PLAN:    In order of problems listed above:  Atrial Flutter s/p Ablation  Paroxysmal Atrial Fibrillation  -Patient underwent atrial flutter ablation in 2018.  Had recurrence of atrial flutter in 08/2021 in the setting of increased alcohol use and missed doses of metoprolol.  Underwent successful DCCV in 09/2021 and maintained NSR for over a year. - Patient has had an irregular pulse for the past 2 weeks (noted on BP cuff). No palpitations, chest pain, shortness of breath, fatigue, syncope or near syncope.  - Today, EKG showed atrial fibrillation with HR 114 BPM. On my recheck, his HR had decreased to the 80s. His BP/HR log at home shows that his HR has ranged from the 60s-80s  - Continue metoprolol succinate 100 mg daily. Instructed patient to call our office if his HR is sustaining >100. Can consider increasing metoprolol if needed  - Continue Eliquis 5 mg twice daily- patient held eliquis prior to gallbladder surgery in 11/2022, but he has not missed any doses of eliquis since 12/13/22  - Plan for DCCV next week  - Arranged follow up with Afib clinic 1-2 weeks after cardioversion. Will also refer patient to EP to discuss possible ablation vs antiarrhythmic therapies  -  Checking BMP and CBC today prior to DCCV. TSH was 1.95 in 10/2022  - Ordered echocardiogram to be completed after DCCV   S/P MVR - Patient had a minimally invasive mitral valve repair in 2014 - Most recent echocardiogram from 12/04/2021 showed only trivial mitral valve regurgitation - Ordered echocardiogram as above   Mild Dilation of the Ascending Aorta  - Echocardiogram in 09/2020 noted mild dilation of the ascending aorta measuring 41 mm.  Unchanged on most recent echocardiogram from 11/2021  - BP well controlled  - Ordered echocardiogram   Hypertension - BP low normal today in clinic- 100/70  - Patient has had occasional episodes of lightheadedness upon standing at home  - Decrease telmisartan to 20 mg daily  - Continue metoprolol succinate 100 mg daily   Hyperlipidemia Hypertriglyceridemia  - Lipid panel from 10/2022 showed LDL 58, HDL 83, triglycerides 473, total cholesterol 207  - Continue lipitor 20 mg daily, fish-oil-omega-3 1 gram daily  - Discussed lifestyle modifications including limiting intake of simple carbohydrates and decreasing alcohol intake   Shared Decision Making/Informed Consent The risks (stroke, cardiac arrhythmias rarely resulting in the need for a temporary or permanent pacemaker, skin irritation or burns and complications associated with conscious sedation including aspiration, arrhythmia, respiratory failure and death), benefits (restoration of normal sinus rhythm) and alternatives of a direct current cardioversion were explained in detail to Mr. Kimes and he agrees to proceed.      Medication Adjustments/Labs and Tests Ordered: Current medicines are reviewed at length with the patient today.  Concerns regarding medicines are outlined above.  Orders Placed This Encounter  Procedures   Basic metabolic panel   CBC   EKG 12-Lead   ECHOCARDIOGRAM COMPLETE   Meds ordered this encounter  Medications   telmisartan (MICARDIS) 20 MG tablet    Sig: Take 1  tablet (20 mg total) by mouth daily.    Dispense:  90 tablet    Refill:  3    Patient Instructions  Medication Instructions:  Your physician has recommended you make the following change in your medication:   REDUCE the Telmisartan to 20 mg taking 1 daily.  You can cut the 40 mg tablets in 1/2 to use   them up and take 1/2 tablet daily  *If you need a refill on your cardiac medications before your next appointment, please call your pharmacy*   Lab Work: TODAY:  BMET & CBC  If you have labs (blood work) drawn today and your tests are completely normal, you will receive your results only by: MyChart Message (if you have MyChart) OR A paper copy in the mail If you have any lab test that is abnormal or we need to change your treatment, we will call you to review the results.   Testing/Procedures: Your physician has requested that you have an echocardiogram AROUND 5/23 OR AFTER. Echocardiography is a painless test that uses sound waves to create images of your heart. It provides your doctor with information about the size and shape of your heart and how well your heart's chambers and valves are working. This procedure takes approximately one hour. There are no restrictions for this procedure. Please do NOT wear cologne, perfume, aftershave, or lotions (deodorant is allowed). Please arrive 15 minutes prior to your appointment time.    Your physician has recommended that you have a Cardioversion (DCCV). Electrical Cardioversion uses a jolt of electricity to your heart either through paddles or wired patches attached to your chest. This is a controlled, usually prescheduled, procedure. Defibrillation is done under light anesthesia in the hospital, and you usually go home the day of the procedure. This is done to get your heart back into a normal rhythm. You are not awake for the procedure. Please see the instruction sheet given to you BELOW:       Dear James [name]  You are scheduled for a  Cardioversion on Thursday, May 2 with Dr. O'Neal.  Please arrive at the [facility] (Main Entrance A) at [facility]: [address] [place], [place] [address] at 9:00 AM.   DIET:  Nothing to eat or drink after midnight except a sip of water with medications (see medication instructions below)  MEDICATION INSTRUCTIONS: !!IF ANY NEW MEDICATIONS ARE STARTED AFTER TODAY, PLEASE NOTIFY YOUR PROVIDER AS SOON AS POSSIBLE!!  FYI: Medications such as Semaglutide (Ozempic, Wegovy), Tirzepatide (Mounjaro, Zepbound), Dulaglutide (Trulicity), etc ("GLP1 agonists") must be held around the time of a procedure. Talk to your provider if you take one of these.   Continue taking your anticoagulant (blood thinner): Apixaban (Eliquis).  You will need to continue this after your procedure until you are told by your provider that it is safe to stop.    LABS: WILL BE DONE TODAY  FYI:  For your safety, and to allow us to monitor your vital signs accurately during the surgery/procedure we request: If you have artificial nails, gel coating, SNS etc, please have those removed prior to your surgery/procedure. Not having the nail coverings /polish removed may result in cancellation or delay of your surgery/procedure.  You must have a responsible person to drive you home and stay in the waiting area during your procedure. Failure to do so could result in cancellation.  Bring your insurance cards.  *Special Note: Every effort is made to have your procedure done on time. Occasionally there are emergencies that occur at the hospital that may cause delays. Please be patient if a delay does occur.       Follow-Up: [facility] and your health needs are our priority.  As part of our continuing mission to provide you with exceptional heart care, we have created designated Provider Care Teams.  These Care Teams   include your primary Cardiologist (physician) and Advanced Practice Providers (APPs -   Physician Assistants and Nurse Practitioners) who all work together to provide you with the care you need, when you need it.  We recommend signing up for the patient portal called "MyChart".  Sign up information is provided on this After Visit Summary.  MyChart is used to connect with patients for Virtual Visits (Telemedicine).  Patients are able to view lab/test results, encounter notes, upcoming appointments, etc.  Non-urgent messages can be sent to your provider as well.   To learn more about what you can do with MyChart, go to [email or web address].    Your next appointment:   1-2 WEEKS AFTER 5/2   Provider:   [facility]     Other Instructions  If your heart rate is consistently running over 100, PLEASE CALL US   Signed, Gregery Walberg R. Kristain Hu, PA-C  01/21/2023 9:57 AM    [facility]  

## 2023-01-21 NOTE — Progress Notes (Signed)
Cardiology Office Note:    Date:  01/21/2023   ID:  James [name], DOB Feb 25, 1946, MRN [id number]  PCP:  Emilio Aspen, MD   [facility] Providers Cardiologist:  Donato Schultz, MD    Referring MD: Kirby Funk, MD   Chief Complaint  Patient presents with   Irregular Heart Beat   Follow-up    Annual Follow Up. Aflutter.     History of Present Illness:    James [name] is a 77 y.o. male with a hx of atrial flutter s/p ablation in 2018, history of mitral valve regurgitation s/p mitral valve repair in 2014, GERD, Barrett's esophagus, hyperlipidemia, hypertension.  Patient is followed by Dr. Anne Fu and presents today for their annual follow-up.  In 2014, patient was found to have severe mitral valve regurgitation/prolapse and underwent minimally invasive mitral valve repair on 01/06/13.  Postoperatively, patient did have brief postoperative atrial fibrillation, but patient spontaneously converted back to sinus rhythm.  Later in 2018, patient had an abrupt onset of palpitations.  He was seen by Dr. Graciela Husbands and was found to be in atrial flutter with 2:1 conduction.  He had a TEE on 07/01/2017 that showed EF 55-60%, no regional wall motion abnormalities, s/p mitral valve repair with mild MR, severe left atrial enlargement.  There were no thrombi noted, and patient underwent successful DCCV with conversion to NSR.  Later underwent atrial flutter ablation on 08/26/17.  Post ablation, patient did very well for a few years until 08/2021 when patient had recurrence of a flutter in the setting of increased alcohol use and missed doses of metoprolol.  Patient had a DCCV on 10/16/2021 with conversion to normal sinus rhythm.  Echocardiogram on 12/04/2021 showed EF 55-60%, no regional wall motion abnormalities, grade 2 diastolic dysfunction, normal RV systolic function, severe left atrial enlargement, mild dilation of the ascending aorta measuring 41 mm.  The mitral valve had been repaired and there  was only trivial regurgitation.  Patient was recently admitted to [facility] f=in 11/2021 for treatment of acute pancreatitis.  He was treated with aggressive IV fluids and supportive care.  Also found to have biliary sludge and underwent cholecystectomy. He did have to hold his eliquis prior to surgery, but has been taking his eliquis uninterrupted since 3/15.   Today, patient reports that he has been doing well since he was discharged from the hospital.  When he was in the hospital, it was noted that his blood pressure was up a bit low.  He was instructed to stop taking his telmisartan.  He was seen by his primary care provider about 2 weeks ago and his blood pressure was elevated at that time.  He was instructed to resume taking telmisartan 40 mg daily.  Since then, he has noticed that his blood pressure has been low at home.  He checks his blood pressure multiple times a day, and his systolic blood pressure has been in the 100s.  He has had occasional episodes of lightheadedness that mostly occur when he goes from sitting to standing.  Denies syncope, near syncope.  When checking his blood pressure, he has noted that his pulse has been irregular.  His pulse has been in the 60s-80s at home, but has been irregular every time he is checked his blood pressure for the past couple of days.  Patient denies any palpitations, shortness of breath, chest pain, ankle edema.  Reports that he has been compliant with his Eliquis and has not missed a dose  since being discharged from the hospital on 12/13/2022.   Past Medical History:  Diagnosis Date   Barrett esophagus 2002   Erectile dysfunction    GERD (gastroesophageal reflux disease)    Hyperlipidemia    Hypertension    Pneumonia 2000   hospitalized   S/P mitral valve repair 01/06/2013   Complex valvuloplasty including triangular resection of flail segment of posterior leaflet, artificial Gore-tex neocord placement x4 and 32 mm Sorin Memo 3D ring  annuloplasty via right mini thoracotomy   Severe mitral regurgitation     Past Surgical History:  Procedure Laterality Date   A-FLUTTER ABLATION N/A 08/26/2017   Procedure: A-FLUTTER ABLATION;  Surgeon: Hillis Range, MD;  Location: [facility] INVASIVE CV LAB;  Service: Cardiovascular;  Laterality: N/A;   CARDIAC CATHETERIZATION     CARDIOVERSION N/A 07/01/2017   Procedure: CARDIOVERSION;  Surgeon: Lewayne Bunting, MD;  Location: [facility] ENDOSCOPY;  Service: Cardiovascular;  Laterality: N/A;   CARDIOVERSION N/A 10/16/2021   Procedure: CARDIOVERSION;  Surgeon: Jake Bathe, MD;  Location: [facility] ENDOSCOPY;  Service: Cardiovascular;  Laterality: N/A;   COLONOSCOPY     INTRAOPERATIVE TRANSESOPHAGEAL ECHOCARDIOGRAM N/A 01/06/2013   Procedure: INTRAOPERATIVE TRANSESOPHAGEAL ECHOCARDIOGRAM;  Surgeon: Purcell Nails, MD;  Location: [facility] OR;  Service: Open Heart Surgery;  Laterality: N/A;   MITRAL VALVE REPAIR Right 01/06/2013   Procedure: MINIMALLY INVASIVE MITRAL VALVE REPAIR (MVR);  Surgeon: Purcell Nails, MD;  Location: [facility] OR;  Service: Open Heart Surgery;  Laterality: Right;   TEE WITHOUT CARDIOVERSION N/A 12/03/2012   Procedure: TRANSESOPHAGEAL ECHOCARDIOGRAM (TEE);  Surgeon: Donato Schultz, MD;  Location: [facility] ENDOSCOPY;  Service: Cardiovascular;  Laterality: N/A;  h/p in file cabinet-ja   TEE WITHOUT CARDIOVERSION N/A 07/01/2017   Procedure: TRANSESOPHAGEAL ECHOCARDIOGRAM (TEE);  Surgeon: Lewayne Bunting, MD;  Location: [facility] ENDOSCOPY;  Service: Cardiovascular;  Laterality: N/A;    Current Medications: Current Meds  Medication Sig   apixaban (ELIQUIS) 5 MG TABS tablet Take 5 mg by mouth 2 (two) times daily.   atorvastatin (LIPITOR) 20 MG tablet Take 20 mg by mouth at bedtime.   Cholecalciferol (VITAMIN D3 PO) Take 1 tablet by mouth 2 (two) times a week.   Coenzyme Q10 (CO Q-10) 100 MG CAPS Take 100 mg by mouth daily.   fish oil-omega-3 fatty acids 1000 MG capsule Take 1 g by mouth daily.    fluticasone  (FLONASE) 50 MCG/ACT nasal spray Place 2 sprays into both nostrils daily as needed for allergies or rhinitis.    MAGNESIUM PO Take 1 tablet by mouth 2 (two) times daily.   Metoprolol Succinate 100 MG CS24 Take 1 tablet by mouth daily.   Multiple Vitamin (MULTIVITAMIN WITH MINERALS) TABS Take 1 tablet by mouth daily. Centrum Silver   Multiple Vitamins-Minerals (ZINC PO) Take 1 tablet by mouth 3 (three) times a week.   omeprazole (PRILOSEC OTC) 20 MG tablet Take 20 mg every evening by mouth.    sildenafil (VIAGRA) 50 MG tablet Take 50 mg by mouth as needed for erectile dysfunction (take as directed).    tamsulosin (FLOMAX) 0.4 MG CAPS capsule Take 1 capsule by mouth daily.   telmisartan (MICARDIS) 20 MG tablet Take 1 tablet (20 mg total) by mouth daily.   [DISCONTINUED] telmisartan (MICARDIS) 40 MG tablet Take 40 mg by mouth at bedtime.     Allergies:   Iodinated contrast media and Iodine   Social History   Socioeconomic History   Marital status: Married    Spouse name: Not  on file   Number of children: 3   Years of education: Not on file   Highest education level: Not on file  Occupational History   Occupation: retired    Comment: Publishing copy  Tobacco Use   Smoking status: Never   Smokeless tobacco: Never  Vaping Use   Vaping Use: Never used  Substance and Sexual Activity   Alcohol use: Not Currently    Alcohol/week: 21.0 standard drinks of alcohol    Types: 7 Glasses of wine, 14 Cans of beer per week    Comment: stopped drinking 09/21/2021   Drug use: No   Sexual activity: Not on file  Other Topics Concern   Not on file  Social History Narrative   Not on file   Social Determinants of Health   Financial Resource Strain: Not on file  Food Insecurity: Not on file  Transportation Needs: Not on file  Physical Activity: Not on file  Stress: Not on file  Social Connections: Not on file     Family History: The patient's family history includes COPD in his mother;  Heart failure in his mother; Hypertension in his mother.  ROS:   Please see the history of present illness.    All other systems reviewed and are negative.  EKGs/Labs/Other Studies Reviewed:    The following studies were reviewed today: Cardiac Studies & Procedures       ECHOCARDIOGRAM  ECHOCARDIOGRAM COMPLETE 12/04/2021  Narrative ECHOCARDIOGRAM REPORT    Patient Name:   BRYAR DAHMS Date of Exam: 12/04/2021 Medical Rec #:  [id number]     Height:       71.0 in Accession #:    [id number]    Weight:       185.6 lb Date of Birth:  [birth year]    BSA:          2.043 m Patient Age:    75 years      BP:           130/76 mmHg Patient Gender: M             HR:           77 bpm. Exam Location:  [address]  Procedure: 2D Echo, 3D Echo, Cardiac Doppler and Color Doppler  Indications:    Ascending Aortic Aneurysm I71  History:        Patient has prior history of Echocardiogram examinations, most recent 10/04/2020. Risk Factors:Dyslipidemia.  Mitral Valve: 32 mm Sorin Memo 3D ring annuloplasty valve is present in the mitral position. Procedure Date: 01/06/2013.  Sonographer:    Thurman Coyer RDCS Referring Phys: [id number] TESSA N CONTE  IMPRESSIONS   1. Left ventricular ejection fraction, by estimation, is 55 to 60%. Left ventricular ejection fraction by 3D volume is 58 %. The left ventricle has normal function. The left ventricle has no regional wall motion abnormalities. The left ventricular internal cavity size was mildly dilated. Left ventricular diastolic parameters are consistent with Grade II diastolic dysfunction (pseudonormalization). 2. Right ventricular systolic function is normal. The right ventricular size is normal. 3. Left atrial size was severely dilated. 4. The mitral valve has been repaired/replaced. Trivial mitral valve regurgitation. There is a 32 mm Sorin Memo 3D ring annuloplasty present in the mitral position. Procedure Date: 01/06/2013. 5. The aortic valve is  grossly normal. Aortic valve regurgitation is trivial. 6. Aortic dilatation noted. There is mild dilatation of the ascending aorta, measuring 41 mm.  FINDINGS Left Ventricle: Left ventricular ejection fraction,  by estimation, is 55 to 60%. Left ventricular ejection fraction by 3D volume is 58 %. The left ventricle has normal function. The left ventricle has no regional wall motion abnormalities. The left ventricular internal cavity size was mildly dilated. There is no left ventricular hypertrophy. Left ventricular diastolic parameters are consistent with Grade II diastolic dysfunction (pseudonormalization).  Right Ventricle: The right ventricular size is normal. Right vetricular wall thickness was not well visualized. Right ventricular systolic function is normal.  Left Atrium: Left atrial size was severely dilated.  Right Atrium: Right atrial size was normal in size.  Pericardium: There is no evidence of pericardial effusion.  Mitral Valve: The mitral valve has been repaired/replaced. Trivial mitral valve regurgitation. There is a 32 mm Sorin Memo 3D ring annuloplasty present in the mitral position. Procedure Date: 01/06/2013. MV peak gradient, 4.8 mmHg. The mean mitral valve gradient is 2.0 mmHg.  Tricuspid Valve: The tricuspid valve is not well visualized. Tricuspid valve regurgitation is trivial.  Aortic Valve: The aortic valve is grossly normal. Aortic valve regurgitation is trivial.  Pulmonic Valve: The pulmonic valve was not well visualized. Pulmonic valve regurgitation is not visualized.  Aorta: Aortic dilatation noted. There is mild dilatation of the ascending aorta, measuring 41 mm.  IAS/Shunts: The atrial septum is grossly normal.   LEFT VENTRICLE PLAX 2D LVIDd:         5.40 cm         Diastology LVIDs:         3.40 cm         LV e' medial:    6.60 cm/s LV PW:         1.00 cm         LV E/e' medial:  14.7 LV IVS:        1.00 cm         LV e' lateral:   6.75 cm/s LVOT diam:      2.00 cm         LV E/e' lateral: 14.4 LV SV:         65 LV SV Index:   32 LVOT Area:     3.14 cm        3D Volume EF LV 3D EF:    Left ventricul ar ejection fraction by 3D volume is 58 %.  3D Volume EF: 3D EF:        58 % LV EDV:       147 ml LV ESV:       62 ml LV SV:        85 ml  RIGHT VENTRICLE RV S prime:     6.75 cm/s TAPSE (M-mode): 2.4 cm  LEFT ATRIUM              Index        RIGHT ATRIUM           Index LA diam:        3.60 cm  1.76 cm/m   RA Area:     17.40 cm LA Vol (A2C):   114.0 ml 55.81 ml/m  RA Volume:   43.70 ml  21.39 ml/m LA Vol (A4C):   94.7 ml  46.36 ml/m LA Biplane Vol: 105.0 ml 51.40 ml/m AORTIC VALVE LVOT Vmax:   94.30 cm/s LVOT Vmean:  63.400 cm/s LVOT VTI:    0.207 m  AORTA Ao Root diam: 3.90 cm Ao Asc diam:  4.10 cm  MITRAL VALVE MV Area (PHT): 3.03 cm  SHUNTS MV Area VTI:   2.11 cm     Systemic VTI:  0.21 m MV Peak grad:  4.8 mmHg     Systemic Diam: 2.00 cm MV Mean grad:  2.0 mmHg MV Vmax:       1.09 m/s MV Vmean:      74.0 cm/s MV Decel Time: 250 msec MV E velocity: 96.90 cm/s MV A velocity: 108.00 cm/s MV E/A ratio:  0.90  Kristeen Miss MD Electronically signed by Kristeen Miss MD Signature Date/Time: 12/04/2021/2:33:01 PM    Final   TEE  ECHO TEE 07/01/2017  Narrative *[place]* *[facility]* [address] [place], [place] [address] [phone]  ------------------------------------------------------------------- Transesophageal Echocardiography  Patient:    Chrystian, Cupples MR #:       [id number] Study Date: 07/01/2017 Gender:     M Age:        70 Height:     188 cm Weight:     123.8 kg BSA:        2.58 m^2 Pt. Status: Room:  ADMITTING    Olga Millers ATTENDING    Olga Millers ORDERING     Olga Millers PERFORMING   Olga Millers REFERRING    Olga Millers SONOGRAPHER  Arvil Chaco  cc:  ------------------------------------------------------------------- LV  EF: 55% -   60%  ------------------------------------------------------------------- Indications:      Atrial fibrillation - 427.31.  ------------------------------------------------------------------- Study Conclusions  - Left ventricle: Systolic function was normal. The estimated ejection fraction was in the range of 55% to 60%. Wall motion was normal; there were no regional wall motion abnormalities. - Aortic valve: No evidence of vegetation. - Mitral valve: Prior procedures included surgical repair. There was mild regurgitation. - Left atrium: The atrium was severely dilated. No evidence of thrombus in the atrial cavity or appendage. - Right atrium: No evidence of thrombus in the atrial cavity or appendage. - Atrial septum: No defect or patent foramen ovale was identified. - Tricuspid valve: No evidence of vegetation. - Pulmonic valve: No evidence of vegetation. - Pericardium, extracardiac: A trivial pericardial effusion was identified.  Impressions:  - Normal LV function; s/p MV repair with mean gradient 4 mmHg and mild MR; severe LAE; mild TR.  ------------------------------------------------------------------- Labs, prior tests, procedures, and surgery: Mitral Valve Repair.  ------------------------------------------------------------------- Study data:   Study status:  Routine.  Consent:  The risks, benefits, and alternatives to the procedure were explained to the patient and informed consent was obtained.  Procedure:  The patient reported no pain pre or post test. Initial setup. The patient was brought to the laboratory. Surface ECG leads were monitored. Sedation. Deep sedation was administered by anesthesiology staff. Transesophageal echocardiography. An adult multiplane transesophageal probe was inserted by the attending cardiologistwithout difficulty. Image quality was adequate.  Study completion:  The patient tolerated the procedure well. There were no  complications.          Diagnostic transesophageal echocardiography.  2D and color Doppler.  Birthdate:  Patient birthdate: 05-01-1946.  Age:  Patient is 77 yr old.  Sex:  Gender: male.    BMI: 35.1 kg/m^2.  Blood pressure:     99/61  Patient status:  Outpatient.  Study date:  Study date: 07/01/2017. Study time: 11:04 AM.  Location:  Endoscopy.  -------------------------------------------------------------------  ------------------------------------------------------------------- Left ventricle:  Systolic function was normal. The estimated ejection fraction was in the range of 55% to 60%. Wall motion was normal; there were no regional wall motion abnormalities.  ------------------------------------------------------------------- Aortic valve:  Structurally normal valve. Trileaflet. Cusp separation was normal.  No evidence of vegetation.  Doppler:  There was no regurgitation.  ------------------------------------------------------------------- Aorta:  Descending aorta: The descending aorta had mild diffuse disease.  ------------------------------------------------------------------- Mitral valve:  Prior procedures included surgical repair.  Doppler: There was mild regurgitation.    Mean gradient (D): 4 mm Hg.  ------------------------------------------------------------------- Left atrium:  The atrium was severely dilated.  No evidence of thrombus in the atrial cavity or appendage.  ------------------------------------------------------------------- Atrial septum:  No defect or patent foramen ovale was identified.  ------------------------------------------------------------------- Right ventricle:  The cavity size was normal. Systolic function was normal.  ------------------------------------------------------------------- Pulmonic valve:    Structurally normal valve.   Cusp separation was normal.  No evidence of vegetation.  Doppler:  There was  trivial regurgitation.  ------------------------------------------------------------------- Tricuspid valve:   Structurally normal valve.   Leaflet separation was normal.  No evidence of vegetation.  Doppler:  There was mild regurgitation.  ------------------------------------------------------------------- Right atrium:  The atrium was normal in size.  No evidence of thrombus in the atrial cavity or appendage.  ------------------------------------------------------------------- Pericardium:  A trivial pericardial effusion was identified.  ------------------------------------------------------------------- Measurements  Mitral valve                 Value Mitral mean velocity, D      87.9  cm/s Mitral mean gradient, D      4     mm Hg Mitral annulus VTI, D        13.3  cm  Legend: (L)  and  (H)  mark values outside specified reference range.  ------------------------------------------------------------------- Prepared and Electronically Authenticated by  Olga Millers 2018-10-02T17:49:37             EKG:  EKG is ordered today.  The ekg ordered today demonstrates Atrial Fibrillation, HR 114 BPM.   Recent Labs: No results found for requested labs within last 365 days.  Recent Lipid Panel No results found for: "CHOL", "TRIG", "HDL", "CHOLHDL", "VLDL", "LDLCALC", "LDLDIRECT"   Risk Assessment/Calculations:    CHA2DS2-VASc Score = 3  This indicates a 3.2% annual risk of stroke. The patient's score is based upon: CHF History: 0 HTN History: 1 Diabetes History: 0 Stroke History: 0 Vascular Disease History: 0 Age Score: 2 Gender Score: 0       Physical Exam:    VS:  BP 100/70   Pulse (!) 114   Ht 5\' 11"  (1.803 m)   Wt 187 lb 3.2 oz (84.9 kg)   SpO2 94%   BMI 26.11 kg/m     Wt Readings from Last 3 Encounters:  01/21/23 187 lb 3.2 oz (84.9 kg)  11/14/21 185 lb 9.6 oz (84.2 kg)  10/24/21 183 lb 6.4 oz (83.2 kg)     GEN: Elderly male, sitting comfortably  on the exam table. Well nourished, well developed in no acute distress HEENT: Normal NECK: No JVD; No carotid bruits  CARDIAC: Irregular rate and rhythm, no murmurs, rubs, gallops. Radial pulses 2+ bilaterally  RESPIRATORY:  Clear to auscultation without rales, wheezing or rhonchi. Normal work of breathing on room air  ABDOMEN: Soft, non-tender, non-distended MUSCULOSKELETAL:  No edema in BLE; No deformity  SKIN: Warm and dry NEUROLOGIC:  Alert and oriented x 3 PSYCHIATRIC:  Normal affect   ASSESSMENT:    1. Atypical atrial flutter   2. Paroxysmal atrial fibrillation   3. S/P mitral valve repair   4. Mild dilation of ascending aorta   5. Primary hypertension   6. Mixed  hyperlipidemia   7. Hypertriglyceridemia    PLAN:    In order of problems listed above:  Atrial Flutter s/p Ablation  Paroxysmal Atrial Fibrillation  -Patient underwent atrial flutter ablation in 2018.  Had recurrence of atrial flutter in 08/2021 in the setting of increased alcohol use and missed doses of metoprolol.  Underwent successful DCCV in 09/2021 and maintained NSR for over a year. - Patient has had an irregular pulse for the past 2 weeks (noted on BP cuff). No palpitations, chest pain, shortness of breath, fatigue, syncope or near syncope.  - Today, EKG showed atrial fibrillation with HR 114 BPM. On my recheck, his HR had decreased to the 80s. His BP/HR log at home shows that his HR has ranged from the 60s-80s  - Continue metoprolol succinate 100 mg daily. Instructed patient to call our office if his HR is sustaining >100. Can consider increasing metoprolol if needed  - Continue Eliquis 5 mg twice daily- patient held eliquis prior to gallbladder surgery in 11/2022, but he has not missed any doses of eliquis since 12/13/22  - Plan for DCCV next week  - Arranged follow up with Afib clinic 1-2 weeks after cardioversion. Will also refer patient to EP to discuss possible ablation vs antiarrhythmic therapies  -  Checking BMP and CBC today prior to DCCV. TSH was 1.95 in 10/2022  - Ordered echocardiogram to be completed after DCCV   S/P MVR - Patient had a minimally invasive mitral valve repair in 2014 - Most recent echocardiogram from 12/04/2021 showed only trivial mitral valve regurgitation - Ordered echocardiogram as above   Mild Dilation of the Ascending Aorta  - Echocardiogram in 09/2020 noted mild dilation of the ascending aorta measuring 41 mm.  Unchanged on most recent echocardiogram from 11/2021  - BP well controlled  - Ordered echocardiogram   Hypertension - BP low normal today in clinic- 100/70  - Patient has had occasional episodes of lightheadedness upon standing at home  - Decrease telmisartan to 20 mg daily  - Continue metoprolol succinate 100 mg daily   Hyperlipidemia Hypertriglyceridemia  - Lipid panel from 10/2022 showed LDL 58, HDL 83, triglycerides 473, total cholesterol 207  - Continue lipitor 20 mg daily, fish-oil-omega-3 1 gram daily  - Discussed lifestyle modifications including limiting intake of simple carbohydrates and decreasing alcohol intake   Shared Decision Making/Informed Consent The risks (stroke, cardiac arrhythmias rarely resulting in the need for a temporary or permanent pacemaker, skin irritation or burns and complications associated with conscious sedation including aspiration, arrhythmia, respiratory failure and death), benefits (restoration of normal sinus rhythm) and alternatives of a direct current cardioversion were explained in detail to Mr. Gardella and he agrees to proceed.      Medication Adjustments/Labs and Tests Ordered: Current medicines are reviewed at length with the patient today.  Concerns regarding medicines are outlined above.  Orders Placed This Encounter  Procedures   Basic metabolic panel   CBC   EKG 12-Lead   ECHOCARDIOGRAM COMPLETE   Meds ordered this encounter  Medications   telmisartan (MICARDIS) 20 MG tablet    Sig: Take 1  tablet (20 mg total) by mouth daily.    Dispense:  90 tablet    Refill:  3    Patient Instructions  Medication Instructions:  Your physician has recommended you make the following change in your medication:   REDUCE the Telmisartan to 20 mg taking 1 daily.  You can cut the 40 mg tablets in 1/2 to use  them up and take 1/2 tablet daily  *If you need a refill on your cardiac medications before your next appointment, please call your pharmacy*   Lab Work: TODAY:  BMET & CBC  If you have labs (blood work) drawn today and your tests are completely normal, you will receive your results only by: MyChart Message (if you have MyChart) OR A paper copy in the mail If you have any lab test that is abnormal or we need to change your treatment, we will call you to review the results.   Testing/Procedures: Your physician has requested that you have an echocardiogram AROUND 5/23 OR AFTER. Echocardiography is a painless test that uses sound waves to create images of your heart. It provides your doctor with information about the size and shape of your heart and how well your heart's chambers and valves are working. This procedure takes approximately one hour. There are no restrictions for this procedure. Please do NOT wear cologne, perfume, aftershave, or lotions (deodorant is allowed). Please arrive 15 minutes prior to your appointment time.    Your physician has recommended that you have a Cardioversion (DCCV). Electrical Cardioversion uses a jolt of electricity to your heart either through paddles or wired patches attached to your chest. This is a controlled, usually prescheduled, procedure. Defibrillation is done under light anesthesia in the hospital, and you usually go home the day of the procedure. This is done to get your heart back into a normal rhythm. You are not awake for the procedure. Please see the instruction sheet given to you BELOW:       Dear Alfonzo Feller  You are scheduled for a  Cardioversion on Thursday, May 2 with Dr. Flora Lipps.  Please arrive at the [facility] (Main Entrance A) at [facility]: [address] [place], [place] [address] at 9:00 AM.   DIET:  Nothing to eat or drink after midnight except a sip of water with medications (see medication instructions below)  MEDICATION INSTRUCTIONS: !!IF ANY NEW MEDICATIONS ARE STARTED AFTER TODAY, PLEASE NOTIFY YOUR PROVIDER AS SOON AS POSSIBLE!!  FYI: Medications such as Semaglutide (Ozempic, [place]), Tirzepatide (Mounjaro, Zepbound), Dulaglutide (Trulicity), etc ("GLP1 agonists") must be held around the time of a procedure. Talk to your provider if you take one of these.   Continue taking your anticoagulant (blood thinner): Apixaban (Eliquis).  You will need to continue this after your procedure until you are told by your provider that it is safe to stop.    LABS: WILL BE DONE TODAY  FYI:  For your safety, and to allow [place] to monitor your vital signs accurately during the surgery/procedure we request: If you have artificial nails, gel coating, SNS etc, please have those removed prior to your surgery/procedure. Not having the nail coverings /polish removed may result in cancellation or delay of your surgery/procedure.  You must have a responsible person to drive you home and stay in the waiting area during your procedure. Failure to do so could result in cancellation.  Bring your insurance cards.  *Special Note: Every effort is made to have your procedure done on time. Occasionally there are emergencies that occur at the hospital that may cause delays. Please be patient if a delay does occur.       Follow-Up: At [facility], you and your health needs are our priority.  As part of our continuing mission to provide you with exceptional heart care, we have created designated Provider Care Teams.  These Care Teams  include your primary Cardiologist (physician) and Advanced Practice Providers (APPs -   Physician Assistants and Nurse Practitioners) who all work together to provide you with the care you need, when you need it.  We recommend signing up for the patient portal called "MyChart".  Sign up information is provided on this After Visit Summary.  MyChart is used to connect with patients for Virtual Visits (Telemedicine).  Patients are able to view lab/test results, encounter notes, upcoming appointments, etc.  Non-urgent messages can be sent to your provider as well.   To learn more about what you can do with MyChart, go to [email or web address].    Your next appointment:   1-2 WEEKS AFTER 5/2   Provider:   [facility]     Other Instructions  If your heart rate is consistently running over 100, PLEASE CALL us   Signed, Jonita Albee, PA-C  01/21/2023 9:57 AM    [facility]

## 2023-01-21 NOTE — Patient Instructions (Signed)
Medication Instructions:  Your physician has recommended you make the following change in your medication:   REDUCE the Telmisartan to 20 mg taking 1 daily.  You can cut the 40 mg tablets in 1/2 to use them up and take 1/2 tablet daily  *If you need a refill on your cardiac medications before your next appointment, please call your pharmacy*   Lab Work: TODAY:  BMET & CBC  If you have labs (blood work) drawn today and your tests are completely normal, you will receive your results only by: MyChart Message (if you have MyChart) OR A paper copy in the mail If you have any lab test that is abnormal or we need to change your treatment, we will call you to review the results.   Testing/Procedures: Your physician has requested that you have an echocardiogram AROUND 5/23 OR AFTER. Echocardiography is a painless test that uses sound waves to create images of your heart. It provides your doctor with information about the size and shape of your heart and how well your heart's chambers and valves are working. This procedure takes approximately one hour. There are no restrictions for this procedure. Please do NOT wear cologne, perfume, aftershave, or lotions (deodorant is allowed). Please arrive 15 minutes prior to your appointment time.    Your physician has recommended that you have a Cardioversion (DCCV). Electrical Cardioversion uses a jolt of electricity to your heart either through paddles or wired patches attached to your chest. This is a controlled, usually prescheduled, procedure. Defibrillation is done under light anesthesia in the hospital, and you usually go home the day of the procedure. This is done to get your heart back into a normal rhythm. You are not awake for the procedure. Please see the instruction sheet given to you BELOW:       Dear James [name]  You are scheduled for a Cardioversion on Thursday, May 2 with Dr. Flora Lipps.  Please arrive at the [facility] (Main Entrance A) at  [facility]: [address] [place], [place] [address] at 9:00 AM.   DIET:  Nothing to eat or drink after midnight except a sip of water with medications (see medication instructions below)  MEDICATION INSTRUCTIONS: !!IF ANY NEW MEDICATIONS ARE STARTED AFTER TODAY, PLEASE NOTIFY YOUR PROVIDER AS SOON AS POSSIBLE!!  FYI: Medications such as Semaglutide (Ozempic, [place]), Tirzepatide (Mounjaro, Zepbound), Dulaglutide (Trulicity), etc ("GLP1 agonists") must be held around the time of a procedure. Talk to your provider if you take one of these.   Continue taking your anticoagulant (blood thinner): Apixaban (Eliquis).  You will need to continue this after your procedure until you are told by your provider that it is safe to stop.    LABS: WILL BE DONE TODAY  FYI:  For your safety, and to allow [place] to monitor your vital signs accurately during the surgery/procedure we request: If you have artificial nails, gel coating, SNS etc, please have those removed prior to your surgery/procedure. Not having the nail coverings /polish removed may result in cancellation or delay of your surgery/procedure.  You must have a responsible person to drive you home and stay in the waiting area during your procedure. Failure to do so could result in cancellation.  Bring your insurance cards.  *Special Note: Every effort is made to have your procedure done on time. Occasionally there are emergencies that occur at the hospital that may cause delays. Please be patient if a delay does occur.  Follow-Up: At [facility], you and your health needs are our priority.  As part of our continuing mission to provide you with exceptional heart care, we have created designated Provider Care Teams.  These Care Teams include your primary Cardiologist (physician) and Advanced Practice Providers (APPs -  Physician Assistants and Nurse Practitioners) who all work together to provide you with the care you need,  when you need it.  We recommend signing up for the patient portal called "MyChart".  Sign up information is provided on this After Visit Summary.  MyChart is used to connect with patients for Virtual Visits (Telemedicine).  Patients are able to view lab/test results, encounter notes, upcoming appointments, etc.  Non-urgent messages can be sent to your provider as well.   To learn more about what you can do with MyChart, go to [email or web address].    Your next appointment:   1-2 WEEKS AFTER 5/2   Provider:   [facility]     Other Instructions  If your heart rate is consistently running over 100, PLEASE CALL us

## 2023-01-22 LAB — BASIC METABOLIC PANEL
BUN/Creatinine Ratio: 11 (ref 10–24)
BUN: 10 mg/dL (ref 8–27)
CO2: 19 mmol/L — ABNORMAL LOW (ref 20–29)
Calcium: 9.4 mg/dL (ref 8.6–10.2)
Chloride: 102 mmol/L (ref 96–106)
Creatinine, Ser: 0.95 mg/dL (ref 0.76–1.27)
Glucose: 108 mg/dL — ABNORMAL HIGH (ref 70–99)
Potassium: 4.6 mmol/L (ref 3.5–5.2)
Sodium: 139 mmol/L (ref 134–144)
eGFR: 83 mL/min/{1.73_m2} (ref >59)

## 2023-01-22 LAB — CBC
Hematocrit: 37.7 % (ref 37.5–51.0)
Hemoglobin: 12.7 g/dL — ABNORMAL LOW (ref 13.0–17.7)
MCH: 33.1 pg — ABNORMAL HIGH (ref 26.6–33.0)
MCHC: 33.7 g/dL (ref 31.5–35.7)
MCV: 98 fL — ABNORMAL HIGH (ref 79–97)
Platelets: 221 10*3/uL (ref 150–450)
RBC: 3.84 x10E6/uL — ABNORMAL LOW (ref 4.14–5.80)
RDW: 13 % (ref 11.6–15.4)
WBC: 5.5 10*3/uL (ref 3.4–10.8)

## 2023-01-22 NOTE — Telephone Encounter (Signed)
Pt has been seen in the office and is scheduled for outpt cardioversion.

## 2023-01-29 NOTE — Progress Notes (Signed)
Spoke with patient, Procedure scheduled for 1000, Please arrive at the hospital at 0900, NPO after midnight on Wednesday, May take meds with sips of water in the AM, please have transportation for home post procedure, and someone to stay with pt for approximately 24 hours after

## 2023-01-30 ENCOUNTER — Encounter (HOSPITAL_COMMUNITY): Payer: Self-pay | Admitting: Cardiovascular Disease

## 2023-01-30 ENCOUNTER — Ambulatory Visit (HOSPITAL_COMMUNITY)
Admission: RE | Admit: 2023-01-30 | Discharge: 2023-01-30 | Disposition: A | Discharge status: Home / Self Care | Payer: Medicare Other | Attending: Cardiovascular Disease | Admitting: Cardiovascular Disease

## 2023-01-30 ENCOUNTER — Ambulatory Visit (HOSPITAL_COMMUNITY): Payer: Medicare Other | Admitting: Anesthesiology

## 2023-01-30 ENCOUNTER — Other Ambulatory Visit: Payer: Self-pay

## 2023-01-30 ENCOUNTER — Ambulatory Visit (HOSPITAL_BASED_OUTPATIENT_CLINIC_OR_DEPARTMENT_OTHER): Payer: Medicare Other | Admitting: Anesthesiology

## 2023-01-30 ENCOUNTER — Encounter (HOSPITAL_COMMUNITY)
Admission: RE | Disposition: A | Discharge status: Home / Self Care | Payer: Self-pay | Source: Home / Self Care | Attending: Cardiovascular Disease

## 2023-01-30 DIAGNOSIS — I480 Paroxysmal atrial fibrillation: Secondary | ICD-10-CM

## 2023-01-30 DIAGNOSIS — I10 Essential (primary) hypertension: Secondary | ICD-10-CM

## 2023-01-30 DIAGNOSIS — E782 Mixed hyperlipidemia: Secondary | ICD-10-CM | POA: Insufficient documentation

## 2023-01-30 DIAGNOSIS — Z79899 Other long term (current) drug therapy: Secondary | ICD-10-CM | POA: Insufficient documentation

## 2023-01-30 DIAGNOSIS — I4891 Unspecified atrial fibrillation: Secondary | ICD-10-CM

## 2023-01-30 DIAGNOSIS — I484 Atypical atrial flutter: Secondary | ICD-10-CM | POA: Diagnosis not present

## 2023-01-30 DIAGNOSIS — I083 Combined rheumatic disorders of mitral, aortic and tricuspid valves: Secondary | ICD-10-CM | POA: Diagnosis not present

## 2023-01-30 DIAGNOSIS — E781 Pure hyperglyceridemia: Secondary | ICD-10-CM | POA: Diagnosis not present

## 2023-01-30 HISTORY — PX: CARDIOVERSION: SHX1299

## 2023-01-30 SURGERY — CARDIOVERSION
Anesthesia: General

## 2023-01-30 MED ORDER — LIDOCAINE 2% (20 MG/ML) 5 ML SYRINGE
INTRAMUSCULAR | Status: DC | PRN
  Administered 2023-01-30: 60 mg via INTRAVENOUS

## 2023-01-30 MED ORDER — SODIUM CHLORIDE 0.9 % IV SOLN: INTRAVENOUS | Status: DC

## 2023-01-30 MED ORDER — PROPOFOL 10 MG/ML IV BOLUS
INTRAVENOUS | Status: DC | PRN
  Administered 2023-01-30: 30 mg via INTRAVENOUS
  Administered 2023-01-30: 70 mg via INTRAVENOUS
  Administered 2023-01-30: 40 mg via INTRAVENOUS

## 2023-01-30 SURGICAL SUPPLY — 1 items: ELECT DEFIB PAD ADLT CADENCE (PAD) ×1 IMPLANT

## 2023-01-30 NOTE — Discharge Instructions (Signed)

## 2023-01-30 NOTE — Anesthesia Preprocedure Evaluation (Signed)
Anesthesia Evaluation  Patient identified by MRN, date of birth, ID band Patient awake    Reviewed: Allergy & Precautions, H&P , NPO status , Patient's Chart, lab work & pertinent test results  Airway Mallampati: II  TM Distance: >3 FB Neck ROM: Full    Dental no notable dental hx.    Pulmonary neg pulmonary ROS   Pulmonary exam normal breath sounds clear to auscultation       Cardiovascular hypertension, Pt. on medications and Pt. on home beta blockers + dysrhythmias Atrial Fibrillation  Rhythm:Irregular Rate:Normal + Systolic murmurs Left Ventricle: Left ventricular ejection fraction, by estimation, is 55  to 60%. Left ventricular ejection fraction by 3D volume is 58 %. The left  ventricle has normal function. The left ventricle has no regional wall  motion abnormalities. The left  ventricular internal cavity size was mildly dilated. There is no left  ventricular hypertrophy. Left ventricular diastolic parameters are  consistent with Grade II diastolic dysfunction (pseudonormalization).   Right Ventricle: The right ventricular size is normal. Right vetricular  wall thickness was not well visualized. Right ventricular systolic  function is normal.   Left Atrium: Left atrial size was severely dilated.   Right Atrium: Right atrial size was normal in size.   Pericardium: There is no evidence of pericardial effusion.   Mitral Valve: The mitral valve has been repaired/replaced. Trivial mitral  valve regurgitation. There is a 32 mm Sorin Memo 3D ring annuloplasty  present in the mitral position. Procedure Date: 01/06/2013. MV peak  gradient, 4.8 mmHg. The mean mitral valve  gradient is 2.0 mmHg.   Tricuspid Valve: The tricuspid valve is not well visualized. Tricuspid  valve regurgitation is trivial.   Aortic Valve: The aortic valve is grossly normal. Aortic valve  regurgitation is trivial.   Pulmonic Valve: The pulmonic valve  was not well visualized. Pulmonic valve  regurgitation is not visualized.   Aorta: Aortic dilatation noted. There is mild dilatation of the ascending  aorta, measuring 41 mm.   IAS/Shunts: The atrial septum is grossly normal.      Neuro/Psych negative neurological ROS  negative psych ROS   GI/Hepatic Neg liver ROS,GERD  ,,  Endo/Other  negative endocrine ROS    Renal/GU negative Renal ROS  negative genitourinary   Musculoskeletal negative musculoskeletal ROS (+)    Abdominal   Peds negative pediatric ROS (+)  Hematology negative hematology ROS (+)   Anesthesia Other Findings   Reproductive/Obstetrics negative OB ROS                             Anesthesia Physical Anesthesia Plan  ASA: 3  Anesthesia Plan: General   Post-op Pain Management: Minimal or no pain anticipated   Induction: Intravenous  PONV Risk Score and Plan: Treatment may vary due to age or medical condition  Airway Management Planned: Mask  Additional Equipment:   Intra-op Plan:   Post-operative Plan:   Informed Consent: I have reviewed the patients History and Physical, chart, labs and discussed the procedure including the risks, benefits and alternatives for the proposed anesthesia with the patient or authorized representative who has indicated his/her understanding and acceptance.     Dental advisory given  Plan Discussed with: [name] and Surgeon  Anesthesia Plan Comments:        Anesthesia Quick Evaluation

## 2023-01-30 NOTE — Anesthesia Procedure Notes (Signed)
Procedure Name: General with mask airway Date/Time: 01/30/2023 8:20 AM  Performed by: Katina Degree, CRNAPre-anesthesia Checklist: Patient identified, Emergency Drugs available, Suction available and Patient being monitored Patient Re-evaluated:Patient Re-evaluated prior to induction Oxygen Delivery Method: Nasal cannula Preoxygenation: Pre-oxygenation with 100% oxygen Induction Type: IV induction Dental Injury: Teeth and Oropharynx as per pre-operative assessment

## 2023-01-30 NOTE — Anesthesia Postprocedure Evaluation (Signed)
Anesthesia Post Note  Patient: James [name]  Procedure(s) Performed: CARDIOVERSION     Patient location during evaluation: PACU Anesthesia Type: General Level of consciousness: awake and alert Pain management: pain level controlled Vital Signs Assessment: post-procedure vital signs reviewed and stable Respiratory status: spontaneous breathing, nonlabored ventilation, respiratory function stable and patient connected to nasal cannula oxygen Cardiovascular status: blood pressure returned to baseline and stable Postop Assessment: no apparent nausea or vomiting Anesthetic complications: no  No notable events documented.  Last Vitals:  Vitals:   01/30/23 0850 01/30/23 0855  BP: 99/66 104/68  Pulse: 71 71  Resp: 18 18  Temp:    SpO2: 97% 99%    Last Pain:  Vitals:   01/30/23 0837  TempSrc: Temporal  PainSc: 0-No pain                 Juvon Teater S

## 2023-01-30 NOTE — Interval H&P Note (Signed)
History and Physical Interval Note:  01/30/2023 8:23 AM  James [name]  has presented today for surgery, with the diagnosis of afib.  The various methods of treatment have been discussed with the patient and family. After consideration of risks, benefits and other options for treatment, the patient has consented to  Procedure(s): CARDIOVERSION (N/A) as a surgical intervention.  The patient's history has been reviewed, patient examined, no change in status, stable for surgery.  I have reviewed the patient's chart and labs.  Questions were answered to the patient's satisfaction.    DCCV for Afib. On eliquis > 3 weeks. No missed doses.   Gerri Spore T. Flora Lipps, MD, [facility]  [facility]  [address] [place], [place] [address] [phone]  8:23 AM

## 2023-01-30 NOTE — Transfer of Care (Signed)
Immediate Anesthesia Transfer of Care Note  Patient: James [name]  Procedure(s) Performed: CARDIOVERSION  Patient Location: Short Stay  Anesthesia Type:MAC  Level of Consciousness: drowsy  Airway & Oxygen Therapy: Patient Spontanous Breathing and Patient connected to nasal cannula oxygen  Post-op Assessment: Report given to RN and Post -op Vital signs reviewed and stable  Post vital signs: Reviewed and stable  Last Vitals:  Vitals Value Taken Time  BP 92/62   Temp    Pulse 75   Resp 15   SpO2 98     Last Pain:  Vitals:   01/30/23 0814  TempSrc:   PainSc: 0-No pain         Complications: No notable events documented.

## 2023-01-30 NOTE — CV Procedure (Signed)
   DIRECT CURRENT CARDIOVERSION  NAME:  James [name]    MRN: [id number] DOB:  06-05-1946    ADMIT DATE: 01/30/2023  Indication:  Symptomatic atrial fibrillation   Procedure Note:  The patient signed informed consent.  They have had had therapeutic anticoagulation with eliquis greater than 3 weeks.  Anesthesia was administered by Dr. Okey Dupre.  Adequate airway was maintained throughout and vital followed per protocol.  They were cardioverted x 1 with 200J of biphasic synchronized energy.  They converted to NSR.  There were no apparent complications.  The patient had normal neuro status and respiratory status post procedure with vitals stable as recorded elsewhere.    Follow up: They will continue on current medical therapy and follow up with cardiology as scheduled.  Gerri Spore T. Flora Lipps, MD, [facility]  [facility]  [address], [address] [place], [place] [address] [phone]  8:32 AM

## 2023-01-31 ENCOUNTER — Encounter (HOSPITAL_COMMUNITY): Payer: Self-pay | Admitting: Cardiovascular Disease

## 2023-02-12 ENCOUNTER — Ambulatory Visit (HOSPITAL_COMMUNITY)
Admission: RE | Admit: 2023-02-12 | Discharge: 2023-02-12 | Disposition: A | Discharge status: Home / Self Care | Payer: Medicare Other | Source: Ambulatory Visit | Attending: Physician Assistant | Admitting: Physician Assistant

## 2023-02-12 VITALS — BP 140/92 | HR 77 | Ht 71.0 in | Wt 187.0 lb

## 2023-02-12 DIAGNOSIS — Z7901 Long term (current) use of anticoagulants: Secondary | ICD-10-CM | POA: Insufficient documentation

## 2023-02-12 DIAGNOSIS — D6869 Other thrombophilia: Secondary | ICD-10-CM | POA: Diagnosis not present

## 2023-02-12 DIAGNOSIS — I4892 Unspecified atrial flutter: Secondary | ICD-10-CM | POA: Diagnosis not present

## 2023-02-12 DIAGNOSIS — I440 Atrioventricular block, first degree: Secondary | ICD-10-CM | POA: Insufficient documentation

## 2023-02-12 DIAGNOSIS — I10 Essential (primary) hypertension: Secondary | ICD-10-CM | POA: Insufficient documentation

## 2023-02-12 DIAGNOSIS — I4819 Other persistent atrial fibrillation: Secondary | ICD-10-CM

## 2023-02-12 NOTE — Progress Notes (Addendum)
Primary Care Physician: Emilio Aspen, MD Referring Physician: Dr. Valentina Lucks EP: Dr. Nelly Laurence  Cardiologist: Dr. Learta Codding James [name] is a 77 y.o. male with a h/o MVR in 2014 and atrial flutter ablation by Dr. Johney Frame in 2018. He waas noted at his physical with PCP to be out of rhythm and was referred here by Dr. Johney Frame. He was started on DOAC by PCP. Eliquis 5 mg bid since last Saturday, first full day. CHA2DS2VASc  score of at least 3.  He stated that he had missed some does of metoprolol and had drank more alcohol over Christmas. EKG shows aflutter at 129 bpm. He feels fatigued and has exertional dyspnea. He does admit to snoring, frequent nocturnal voiding's and daytime somnolence.   F/u afib clinic, 10/24/21. He had a successful cardioversion, 1/17,  and remains in SR today. He does describe to me, Christmas Eve, having the same chest tightness for several hours. The chest discomfort felt just like the discomfort that he had driving home form the beach in November,  which was dx per pt as costochondritis. He took aleve and the symptoms resolved after 3-4 hours. He said it was  a very sharp pain and hurt with deep breathing. He has not had any further episodes since then. He is pending an appointment with Tereso Newcomer, PA, 2/15.   Follow up in the AF clinic 02/12/23. Patient was found to be in persistent atrial fibrillation post gallbladder surgery and underwent DCCV on 01/30/23. This was his first episode of afib since 09/2021. He remains in SR today and is feeling well. He has recovered nicely from his surgery. No bleeding issues on anticoagulation.   Today, he denies symptoms of palpitations, chest pain, shortness of breath, orthopnea, PND, lower extremity edema, dizziness, presyncope, syncope, or neurologic sequela. The patient is tolerating medications without difficulties and is otherwise without complaint today.   Past Medical History:  Diagnosis Date   Barrett esophagus 2002    Erectile dysfunction    GERD (gastroesophageal reflux disease)    Hyperlipidemia    Hypertension    Pneumonia 2000   hospitalized   S/P mitral valve repair 01/06/2013   Complex valvuloplasty including triangular resection of flail segment of posterior leaflet, artificial Gore-tex neocord placement x4 and 32 mm Sorin Memo 3D ring annuloplasty via right mini thoracotomy   Severe mitral regurgitation    Past Surgical History:  Procedure Laterality Date   A-FLUTTER ABLATION N/A 08/26/2017   Procedure: A-FLUTTER ABLATION;  Surgeon: Hillis Range, MD;  Location: [facility] INVASIVE CV LAB;  Service: Cardiovascular;  Laterality: N/A;   CARDIAC CATHETERIZATION     CARDIOVERSION N/A 07/01/2017   Procedure: CARDIOVERSION;  Surgeon: Lewayne Bunting, MD;  Location: [facility] ENDOSCOPY;  Service: Cardiovascular;  Laterality: N/A;   CARDIOVERSION N/A 10/16/2021   Procedure: CARDIOVERSION;  Surgeon: Jake Bathe, MD;  Location: [facility] ENDOSCOPY;  Service: Cardiovascular;  Laterality: N/A;   CARDIOVERSION N/A 01/30/2023   Procedure: CARDIOVERSION;  Surgeon: Sande Rives, MD;  Location: [facility] INVASIVE CV LAB;  Service: Cardiovascular;  Laterality: N/A;   COLONOSCOPY     INTRAOPERATIVE TRANSESOPHAGEAL ECHOCARDIOGRAM N/A 01/06/2013   Procedure: INTRAOPERATIVE TRANSESOPHAGEAL ECHOCARDIOGRAM;  Surgeon: Purcell Nails, MD;  Location: [facility] OR;  Service: Open Heart Surgery;  Laterality: N/A;   MITRAL VALVE REPAIR Right 01/06/2013   Procedure: MINIMALLY INVASIVE MITRAL VALVE REPAIR (MVR);  Surgeon: Purcell Nails, MD;  Location: [facility] OR;  Service: Open Heart Surgery;  Laterality: Right;  TEE WITHOUT CARDIOVERSION N/A 12/03/2012   Procedure: TRANSESOPHAGEAL ECHOCARDIOGRAM (TEE);  Surgeon: Donato Schultz, MD;  Location: [facility] ENDOSCOPY;  Service: Cardiovascular;  Laterality: N/A;  h/p in file cabinet-ja   TEE WITHOUT CARDIOVERSION N/A 07/01/2017   Procedure: TRANSESOPHAGEAL ECHOCARDIOGRAM (TEE);  Surgeon: Lewayne Bunting, MD;  Location: [facility]  ENDOSCOPY;  Service: Cardiovascular;  Laterality: N/A;    Current Outpatient Medications  Medication Sig Dispense Refill   allopurinol (ZYLOPRIM) 100 MG tablet Take 100 mg by mouth 2 (two) times daily.     apixaban (ELIQUIS) 5 MG TABS tablet Take 5 mg by mouth 2 (two) times daily.     atorvastatin (LIPITOR) 20 MG tablet Take 20 mg by mouth at bedtime.     Cholecalciferol (VITAMIN D3 PO) Take 1 tablet by mouth once a week.     Coenzyme Q10 (CO Q-10) 100 MG CAPS Take 100 mg by mouth daily.     ELDERBERRY PO Take 1 capsule by mouth once a week.     fish oil-omega-3 fatty acids 1000 MG capsule Take 1 g by mouth daily.      fluticasone (FLONASE) 50 MCG/ACT nasal spray Place 2 sprays into both nostrils daily as needed for allergies or rhinitis.      MAGNESIUM PO Take 1 tablet by mouth once a week.     Melatonin 5 MG CAPS Take 5 mg by mouth at bedtime as needed (sleep).     metoprolol succinate (TOPROL-XL) 100 MG 24 hr tablet Take 100 mg by mouth daily.     Multiple Vitamin (MULTIVITAMIN WITH MINERALS) TABS Take 1 tablet by mouth daily. Centrum Silver     Multiple Vitamins-Minerals (ZINC PO) Take 1 tablet by mouth once a week.     omeprazole (PRILOSEC OTC) 20 MG tablet Take 20 mg every evening by mouth.      OVER THE COUNTER MEDICATION Take 1 tablet by mouth daily. Super beta prostate     sildenafil (REVATIO) 20 MG tablet Take 40-60 mg by mouth daily as needed (ED).     telmisartan (MICARDIS) 20 MG tablet Take 1 tablet (20 mg total) by mouth daily. 90 tablet 3   No current facility-administered medications for this encounter.    Allergies  Allergen Reactions   Iodinated Contrast Media Itching and Swelling    Rash, itching back approx 6 hours after scan Facial sewlling approx 10 hours after scan MD morning after scan gave steroid shot - cleared after 2 hours   Iodine Itching, Swelling and Other (See Comments)    Dye from a CT scan.  Had reaction. Lips and face swelling    Social History    Socioeconomic History   Marital status: Married    Spouse name: Not on file   Number of children: 3   Years of education: Not on file   Highest education level: Not on file  Occupational History   Occupation: retired    Comment: Publishing copy  Tobacco Use   Smoking status: Never   Smokeless tobacco: Never  Vaping Use   Vaping Use: Never used  Substance and Sexual Activity   Alcohol use: Not Currently    Alcohol/week: 21.0 standard drinks of alcohol    Types: 7 Glasses of wine, 14 Cans of beer per week    Comment: stopped drinking 09/21/2021   Drug use: No   Sexual activity: Not on file  Other Topics Concern   Not on file  Social History Narrative   Not on file  Social Determinants of Health   Financial Resource Strain: Not on file  Food Insecurity: Not on file  Transportation Needs: Not on file  Physical Activity: Not on file  Stress: Not on file  Social Connections: Not on file  Intimate Partner Violence: Not on file    Family History  Problem Relation Age of Onset   Hypertension Mother    COPD Mother    Heart failure Mother     ROS- All systems are reviewed and negative except as per the HPI above  Physical Exam: Vitals:   02/12/23 1005  BP: (!) 140/92  Pulse: 77  Weight: 84.8 kg  Height: 5\' 11"  (1.803 m)    Wt Readings from Last 3 Encounters:  02/12/23 84.8 kg  01/30/23 83.9 kg  01/21/23 84.9 kg    Labs: Lab Results  Component Value Date   NA 139 01/21/2023   K 4.6 01/21/2023   CL 102 01/21/2023   CO2 19 (L) 01/21/2023   GLUCOSE 108 (H) 01/21/2023   BUN 10 01/21/2023   CREATININE 0.95 01/21/2023   CALCIUM 9.4 01/21/2023   MG 2.5 01/07/2013   Lab Results  Component Value Date   INR 0.98 01/10/2013    GEN- The patient is a well appearing elderly male, alert and oriented x 3 today.   HEENT-head normocephalic, atraumatic, sclera clear, conjunctiva pink, hearing intact, trachea midline. Lungs- Clear to ausculation bilaterally,  normal work of breathing Heart- Regular rate and rhythm, no murmurs, rubs or gallops  GI- soft, NT, ND, + BS Extremities- no clubbing, cyanosis, or edema MS- no significant deformity or atrophy Skin- no rash or lesion Psych- euthymic mood, full affect Neuro- strength and sensation are intact   EKG today demonstrates SR, 1st degree AV block, LAFB Vent. rate 77 BPM PR interval 204 ms QRS duration 74 ms QT/QTcB 392/443 ms   CHA2DS2-VASc Score = 3  The patient's score is based upon: CHF History: 0 HTN History: 1 Diabetes History: 0 Stroke History: 0 Vascular Disease History: 0 Age Score: 2 Gender Score: 0       ASSESSMENT AND PLAN: 1. Persistent Atrial Fibrillation/atrial flutter The patient's CHA2DS2-VASc score is 3, indicating a 3.2% annual risk of stroke.   S/p flutter ablation 2018 with Dr Johney Frame. S/p DCCV 01/30/23 Patient appears to be maintaining SR. He has a visit with Dr Nelly Laurence 5/28 to discuss rhythm options. Given that this latest episode may have been triggered by surgery and the episode before that over one year ago could have been related to alcohol use and missed BB doses, patient may opt for watchful waiting.  Continue Eliquis 5 mg BID Continue Toprol 100 mg daily  2. Secondary Hypercoagulable State (ICD10:  D68.69) The patient is at significant risk for stroke/thromboembolism based upon his CHA2DS2-VASc Score of 3.  Continue Apixaban (Eliquis).   3. VHD S/p minimally invasive MVR 2014  4. HTN Stable, no changes today.   Follow up with Dr Nelly Laurence as scheduled.    Jorja Loa PA-C Afib Clinic [facility] [address] [place], [place] [address] [phone]

## 2023-02-18 ENCOUNTER — Ambulatory Visit (HOSPITAL_COMMUNITY): Payer: Medicare Other | Attending: Cardiology

## 2023-02-18 DIAGNOSIS — I484 Atypical atrial flutter: Secondary | ICD-10-CM | POA: Diagnosis not present

## 2023-02-18 DIAGNOSIS — E782 Mixed hyperlipidemia: Secondary | ICD-10-CM | POA: Diagnosis not present

## 2023-02-18 LAB — ECHOCARDIOGRAM COMPLETE
Area-P 1/2: 2.51 cm2
MV VTI: 2.33 cm2
P 1/2 time: 573 msec
S' Lateral: 2.8 cm

## 2023-02-20 ENCOUNTER — Telehealth: Payer: Self-pay

## 2023-02-20 NOTE — Telephone Encounter (Signed)
-----   Message from Jonita Albee, [place] sent at 02/20/2023  3:20 PM EDT ----- Please tell patient that his echocardiogram showed his EF (pumping function of his heart) was within normal limits at 60-65%. The heart walls were a little thick, which can happen with age and high blood pressures. Echo showed normal structure and function of the mitral valve prosthesis, and there is mild tightening of the mitral valve. The aortic root is mildly dilated. He should continue his current medications and we will continue to repeat echos in the future.   Thanks! KJ

## 2023-02-20 NOTE — Telephone Encounter (Signed)
Left voicemail to return call to office.

## 2023-02-21 ENCOUNTER — Telehealth: Payer: Self-pay | Admitting: Cardiology

## 2023-02-21 NOTE — Telephone Encounter (Signed)
Spoke to the patient , explained APP recommendations:   Please tell patient that his echocardiogram showed his EF (pumping function of his heart) was within normal limits at 60-65%. The heart walls were a little thick, which can happen with age and high blood pressures. Echo showed normal structure and function of the mitral valve prosthesis, and there is mild tightening of the mitral valve. The aortic root is mildly dilated. He should continue his current medications and we will continue to repeat echos in the future.    Patient voiced understanding.

## 2023-02-21 NOTE — Telephone Encounter (Signed)
Follow Up:      Patient is returning a call from yesterday. 

## 2023-02-25 ENCOUNTER — Ambulatory Visit: Payer: Medicare Other | Admitting: Cardiovascular Disease

## 2023-03-11 ENCOUNTER — Ambulatory Visit: Payer: Medicare Other | Attending: Cardiovascular Disease | Admitting: Cardiovascular Disease

## 2023-03-11 ENCOUNTER — Encounter: Payer: Self-pay | Admitting: Cardiovascular Disease

## 2023-03-11 VITALS — BP 150/82 | HR 74 | Ht 71.0 in | Wt 188.0 lb

## 2023-03-11 DIAGNOSIS — I4819 Other persistent atrial fibrillation: Secondary | ICD-10-CM

## 2023-03-11 NOTE — Progress Notes (Signed)
Electrophysiology Office Note:    Date:  03/11/2023   ID:  James [name], DOB 09/21/1946, MRN [id number]  PCP:  Emilio Aspen, MD   [facility] Providers Cardiologist:  Donato Schultz, MD Electrophysiologist:  Maurice Small, MD     Referring MD: Emilio Aspen, *   History of Present Illness:    James [name] is a 77 y.o. male with a hx of Mitral valve repair in 2014significant for atrial fibrillation, referred for arrhythmia management.  He has been followed by Dr. Johney Frame in the past and underwent atrial flutter ablation in 2018.  He was noted by his PCP to have an irregular rhythm and referred back for management of atrial flutter.  He has been managed in atrial fibrillation clinic.  He underwent a cardioversion in January.  In follow-up in May 2024 he was noted to be in atrial flutter and underwent DC cardioversion subsequently.  He has not had any symptoms with these atrial fibrillation.   Current Medications: Current Meds  Medication Sig   allopurinol (ZYLOPRIM) 100 MG tablet Take 100 mg by mouth 2 (two) times daily.   apixaban (ELIQUIS) 5 MG TABS tablet Take 5 mg by mouth 2 (two) times daily.   atorvastatin (LIPITOR) 20 MG tablet Take 20 mg by mouth at bedtime.   Cholecalciferol (VITAMIN D3 PO) Take 1 tablet by mouth once a week.   Coenzyme Q10 (CO Q-10) 100 MG CAPS Take 100 mg by mouth daily.   ELDERBERRY PO Take 1 capsule by mouth once a week.   fish oil-omega-3 fatty acids 1000 MG capsule Take 1 g by mouth daily.    fluticasone (FLONASE) 50 MCG/ACT nasal spray Place 2 sprays into both nostrils daily as needed for allergies or rhinitis.    MAGNESIUM PO Take 1 tablet by mouth once a week.   Melatonin 5 MG CAPS Take 5 mg by mouth at bedtime as needed (sleep).   metoprolol succinate (TOPROL-XL) 100 MG 24 hr tablet Take 100 mg by mouth daily.   Multiple Vitamin (MULTIVITAMIN WITH MINERALS) TABS Take 1 tablet by mouth daily. Centrum Silver   Multiple  Vitamins-Minerals (ZINC PO) Take 1 tablet by mouth once a week.   omeprazole (PRILOSEC OTC) 20 MG tablet Take 20 mg every evening by mouth.    OVER THE COUNTER MEDICATION Take 1 tablet by mouth daily. Super beta prostate   sildenafil (REVATIO) 20 MG tablet Take 40-60 mg by mouth daily as needed (ED).   tamsulosin (FLOMAX) 0.4 MG CAPS capsule Take 0.4 mg by mouth daily.   telmisartan (MICARDIS) 20 MG tablet Take 1 tablet (20 mg total) by mouth daily.      EKGs/Labs/Other Studies Reviewed Today:    Echocardiogram:  TTE 02/18/2023 LVEF 60-65%, moderate LVH. Severely dilated left atrium. S/p MVR with mild mitral stenosis   Monitors:   Stress testing:   Advanced imaging:   Cardiac catherization   EKG:  Last EKG results: today - sinus rhythm with first degree AV block    Physical Exam:    VS:  BP (!) 150/82   Pulse 74   Ht 5\' 11"  (1.803 m)   Wt 188 lb (85.3 kg)   SpO2 97%   BMI 26.22 kg/m     Wt Readings from Last 3 Encounters:  03/11/23 188 lb (85.3 kg)  02/12/23 187 lb (84.8 kg)  01/30/23 185 lb (83.9 kg)     GEN: Well nourished, well developed in no acute distress CARDIAC: RRR,  no murmurs, rubs, gallops RESPIRATORY:  Normal work of breathing MUSCULOSKELETAL: no edema    ASSESSMENT & PLAN:    Persistent atrial fibrillation Mild, if any symptoms Has had two persistent episodes, one precipitated possibly by excessive alcohol and missing medications, the other by gall bladder surgery I do not see an urgent indication for ablation or rhythm control presently I expect he has paroxysms of AF that he is not aware of I advised him to get a [facility] device to monitor his AF burden. Given the history of MS, if he has a clinically significant AF burden, may need to discuss warfarin   Secondary hypercoagulable state Continue apixaban 5 mg  S/P mitral valve repair for mitral stenosis Mild MS      Signed, Maurice Small, MD  03/11/2023 4:19 PM    [facility]

## 2023-03-11 NOTE — Patient Instructions (Signed)
Medication Instructions:  Your physician recommends that you continue on your current medications as directed. Please refer to the Current Medication list given to you today.  *If you need a refill on your cardiac medications before your next appointment, please call your pharmacy*  Follow-Up: At  HeartCare, you and your health needs are our priority.  As part of our continuing mission to provide you with exceptional heart care, we have created designated Provider Care Teams.  These Care Teams include your primary Cardiologist (physician) and Advanced Practice Providers (APPs -  Physician Assistants and Nurse Practitioners) who all work together to provide you with the care you need, when you need it.  Your next appointment:   6 month(s)  Provider:   You may see Augustus E Mealor, MD or one of the following Advanced Practice Providers on your designated Care Team:   Renee Ursuy, PA-C Michael "Andy" Tillery, PA-C Suzann Riddle, NP   

## 2023-07-21 ENCOUNTER — Telehealth: Payer: Self-pay | Admitting: Cardiovascular Disease

## 2023-07-21 NOTE — Telephone Encounter (Signed)
Patient c/o Palpitations:  STAT if patient reporting lightheadedness, shortness of breath, or chest pain  How long have you had palpitations/irregular HR/ Afib? Are you having the symptoms now? Patient is in Afib  at this time- been in Afib since last week  Are you currently experiencing lightheadedness, SOB or CP?  Lightheaded sometimes  Do you have a history of afib (atrial fibrillation) or irregular heart rhythm? yes  Have you checked your BP or HR? (document readings if available): 90 to 135 heart rate  Are you experiencing any other symptoms?

## 2023-07-21 NOTE — Telephone Encounter (Signed)
Took call direct from [facility] team. Pt states he is in Afib per apple watch for several days. Pt denies symptoms over than some dizziness when standing at times. Pt stated his HR has ran up to 135 during this time. Pt has been taking eliquis as directed. Pt will be going out of town this week and will not be back for 2 weeks. Set appointment with Canary Brim, NP for 08/11/23. Told pt we would let him know if we could work him in this week before he leaves and to stay hydrated and make slow transitions when standing. Gave 911/ED precautions incase of increased symptoms.   Called pt back. Afib clinic has opening 07/22/23. Pt took appt and was thankful.

## 2023-07-22 ENCOUNTER — Encounter (HOSPITAL_COMMUNITY): Payer: Self-pay | Admitting: Physician Assistant

## 2023-07-22 ENCOUNTER — Ambulatory Visit (HOSPITAL_COMMUNITY)
Admission: RE | Admit: 2023-07-22 | Discharge: 2023-07-22 | Disposition: A | Discharge status: Home / Self Care | Payer: Medicare Other | Source: Ambulatory Visit | Attending: Physician Assistant | Admitting: Physician Assistant

## 2023-07-22 VITALS — BP 132/82 | HR 130 | Ht 71.0 in | Wt 197.6 lb

## 2023-07-22 DIAGNOSIS — I10 Essential (primary) hypertension: Secondary | ICD-10-CM | POA: Insufficient documentation

## 2023-07-22 DIAGNOSIS — I480 Paroxysmal atrial fibrillation: Secondary | ICD-10-CM | POA: Diagnosis present

## 2023-07-22 DIAGNOSIS — Z7901 Long term (current) use of anticoagulants: Secondary | ICD-10-CM | POA: Insufficient documentation

## 2023-07-22 DIAGNOSIS — I4819 Other persistent atrial fibrillation: Secondary | ICD-10-CM | POA: Insufficient documentation

## 2023-07-22 DIAGNOSIS — R9431 Abnormal electrocardiogram [ECG] [EKG]: Secondary | ICD-10-CM | POA: Diagnosis not present

## 2023-07-22 DIAGNOSIS — Z952 Presence of prosthetic heart valve: Secondary | ICD-10-CM | POA: Insufficient documentation

## 2023-07-22 DIAGNOSIS — I4892 Unspecified atrial flutter: Secondary | ICD-10-CM | POA: Diagnosis present

## 2023-07-22 DIAGNOSIS — D6869 Other thrombophilia: Secondary | ICD-10-CM | POA: Insufficient documentation

## 2023-07-22 LAB — CBC
HCT: 34.4 % — ABNORMAL LOW (ref 39.0–52.0)
Hemoglobin: 11.8 g/dL — ABNORMAL LOW (ref 13.0–17.0)
MCH: 35 pg — ABNORMAL HIGH (ref 26.0–34.0)
MCHC: 34.3 g/dL (ref 30.0–36.0)
MCV: 102.1 fL — ABNORMAL HIGH (ref 80.0–100.0)
Platelets: 207 10*3/uL (ref 150–400)
RBC: 3.37 MIL/uL — ABNORMAL LOW (ref 4.22–5.81)
RDW: 12.6 % (ref 11.5–15.5)
WBC: 7.2 10*3/uL (ref 4.0–10.5)
nRBC: 0 % (ref 0.0–0.2)

## 2023-07-22 LAB — BASIC METABOLIC PANEL
Anion gap: 12 (ref 5–15)
BUN: 15 mg/dL (ref 8–23)
CO2: 19 mmol/L — ABNORMAL LOW (ref 22–32)
Calcium: 9 mg/dL (ref 8.9–10.3)
Chloride: 103 mmol/L (ref 98–111)
Creatinine, Ser: 0.87 mg/dL (ref 0.61–1.24)
GFR, Estimated: >60 mL/min (ref >60)
Glucose, Bld: 132 mg/dL — ABNORMAL HIGH (ref 70–99)
Potassium: 3.9 mmol/L (ref 3.5–5.1)
Sodium: 134 mmol/L — ABNORMAL LOW (ref 135–145)

## 2023-07-22 NOTE — OR Nursing (Signed)
Called patient with pre-procedure instructions for tomorrow.   Patient informed of:   Time to arrive for procedure. 1230 Remain NPO past midnight.  Must have a ride home and a responsible adult to remain with them for 24 ours post procedure.  Confirmed blood thinner. Confirmed no breaks in taking blood thinner for 3+ weeks prior to procedure. Confirmed patient stopped all GLP-1s and GLP-2s for at least one week before procedure.   Spoke with patient about above information.
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
## 2023-07-22 NOTE — Patient Instructions (Addendum)
Cardioversion scheduled for: Wednesday, October 23rd   - Arrive at the [facility] and go to admitting at [facility]   - Do not eat or drink anything after midnight the night prior to your procedure.   - Take all your morning medication (except diabetic medications) with a sip of water prior to arrival.  - You will not be able to drive home after your procedure.    - Do NOT miss any doses of your blood thinner - if you should miss a dose please notify our office immediately.   - If you feel as if you go back into normal rhythm prior to scheduled cardioversion, please notify our office immediately.   If your procedure is canceled in the cardioversion suite you will be charged a cancellation fee.
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
## 2023-07-23 ENCOUNTER — Ambulatory Visit (HOSPITAL_COMMUNITY)
Admission: RE | Admit: 2023-07-23 | Discharge: 2023-07-23 | Disposition: A | Discharge status: Home / Self Care | Payer: Medicare Other | Attending: Cardiology | Admitting: Cardiology

## 2023-07-23 ENCOUNTER — Ambulatory Visit (HOSPITAL_COMMUNITY): Payer: Medicare Other | Admitting: Anesthesiology

## 2023-07-23 ENCOUNTER — Other Ambulatory Visit: Payer: Self-pay

## 2023-07-23 ENCOUNTER — Encounter (HOSPITAL_COMMUNITY)
Admission: RE | Disposition: A | Discharge status: Home / Self Care | Payer: Self-pay | Source: Home / Self Care | Attending: Cardiology

## 2023-07-23 DIAGNOSIS — D6869 Other thrombophilia: Secondary | ICD-10-CM | POA: Insufficient documentation

## 2023-07-23 DIAGNOSIS — Z79899 Other long term (current) drug therapy: Secondary | ICD-10-CM | POA: Diagnosis not present

## 2023-07-23 DIAGNOSIS — Z7901 Long term (current) use of anticoagulants: Secondary | ICD-10-CM | POA: Insufficient documentation

## 2023-07-23 DIAGNOSIS — I10 Essential (primary) hypertension: Secondary | ICD-10-CM | POA: Diagnosis not present

## 2023-07-23 DIAGNOSIS — I4819 Other persistent atrial fibrillation: Secondary | ICD-10-CM | POA: Insufficient documentation

## 2023-07-23 DIAGNOSIS — I340 Nonrheumatic mitral (valve) insufficiency: Secondary | ICD-10-CM | POA: Insufficient documentation

## 2023-07-23 DIAGNOSIS — I4891 Unspecified atrial fibrillation: Secondary | ICD-10-CM | POA: Diagnosis not present

## 2023-07-23 DIAGNOSIS — I4892 Unspecified atrial flutter: Secondary | ICD-10-CM | POA: Insufficient documentation

## 2023-07-23 HISTORY — PX: CARDIOVERSION: SHX1299

## 2023-07-23 SURGERY — CARDIOVERSION
Anesthesia: Monitor Anesthesia Care

## 2023-07-23 MED ORDER — LIDOCAINE 2% (20 MG/ML) 5 ML SYRINGE
INTRAMUSCULAR | Status: DC | PRN
  Administered 2023-07-23: 60 mg via INTRAVENOUS

## 2023-07-23 MED ORDER — DEXTROSE-SODIUM CHLORIDE 5-0.45 % IV SOLN: INTRAVENOUS | Status: DC

## 2023-07-23 MED ORDER — PROPOFOL 10 MG/ML IV BOLUS
INTRAVENOUS | Status: DC | PRN
  Administered 2023-07-23: 20 mg via INTRAVENOUS
  Administered 2023-07-23: 50 mg via INTRAVENOUS

## 2023-07-23 SURGICAL SUPPLY — 1 items: PAD DEFIB RADIO PHYSIO CONN (PAD) ×1 IMPLANT

## 2023-07-23 NOTE — Transfer of Care (Signed)
Immediate Anesthesia Transfer of Care Note  Patient: James [name]  Procedure(s) Performed: CARDIOVERSION  Patient Location: Cath Lab  Anesthesia Type:MAC  Level of Consciousness: awake  Airway & Oxygen Therapy: Patient Spontanous Breathing and Patient connected to nasal cannula oxygen  Post-op Assessment: Report given to RN and Post -op Vital signs reviewed and stable  Post vital signs: Reviewed and stable  Last Vitals:  Vitals Value Taken Time  BP 117/80 07/23/23 1115  Temp    Pulse 81 07/23/23 1116  Resp 34 07/23/23 1116  SpO2 96 % 07/23/23 1116  Vitals shown include unfiled device data.  Last Pain:  Vitals:   07/23/23 0952  TempSrc: Temporal         Complications: No notable events documented.

## 2023-07-23 NOTE — CV Procedure (Signed)
    Electrical Cardioversion Procedure Note James [name] [id number] [birth year]  Procedure: Electrical Cardioversion Indications:  Atrial Fibrillation  Time Out: Verified patient identification, verified procedure,medications/allergies/relevent history reviewed, required imaging and test results available.  Performed  Procedure Details  The patient was NPO after midnight. Anesthesia was administered at the beside  by [facility] with 80mg  of propofol and 60mg  of Lidocaine.  Cardioversion was done with synchronized biphasic defibrillation with AP pads with 200watts.  The patient converted to normal sinus rhythm. The patient tolerated the procedure well   IMPRESSION:  Successful cardioversion of atrial fibrillation    James [name] 07/23/2023, 9:52 AM

## 2023-07-23 NOTE — Interval H&P Note (Signed)
History and Physical Interval Note:  07/23/2023 9:52 AM  James [name]  has presented today for surgery, with the diagnosis of afib.  The various methods of treatment have been discussed with the patient and family. After consideration of risks, benefits and other options for treatment, the patient has consented to  Procedure(s): CARDIOVERSION (N/A) as a surgical intervention.  The patient's history has been reviewed, patient examined, no change in status, stable for surgery.  I have reviewed the patient's chart and labs.  Questions were answered to the patient's satisfaction.     [name]

## 2023-07-23 NOTE — Discharge Instructions (Signed)
Electrical Cardioversion °Electrical cardioversion is the delivery of a jolt of electricity to restore a normal rhythm to the heart. A rhythm that is too fast or is not regular keeps the heart from pumping well. In this procedure, sticky patches or metal paddles are placed on the chest to deliver electricity to the heart from a device. °This procedure may be done in an emergency if: °There is low or no blood pressure as a result of the heart rhythm. °Normal rhythm must be restored as fast as possible to protect the brain and heart from further damage. °It may save a life. °This may also be a scheduled procedure for irregular or fast heart rhythms that are not immediately life-threatening. ° °What can I expect after the procedure? °Your blood pressure, heart rate, breathing rate, and blood oxygen level will be monitored until you leave the hospital or clinic. °Your heart rhythm will be watched to make sure it does not change. °You may have some redness on the skin where the shocks were given. Over the counter cortizone cream may be helpful.  °Follow these instructions at home: °Do not drive for 24 hours if you were given a sedative during your procedure. °Take over-the-counter and prescription medicines only as told by your health care provider. °Ask your health care provider how to check your pulse. Check it often. °Rest for 48 hours after the procedure or as told by your health care provider. °Avoid or limit your caffeine use as told by your health care provider. °Keep all follow-up visits as told by your health care provider. This is important. °Contact a health care provider if: °You feel like your heart is beating too quickly or your pulse is not regular. °You have a serious muscle cramp that does not go away. °Get help right away if: °You have discomfort in your chest. °You are dizzy or you feel faint. °You have trouble breathing or you are short of breath. °Your speech is slurred. °You have trouble moving an  arm or leg on one side of your body. °Your fingers or toes turn cold or blue. °Summary °Electrical cardioversion is the delivery of a jolt of electricity to restore a normal rhythm to the heart. °This procedure may be done right away in an emergency or may be a scheduled procedure if the condition is not an emergency. °Generally, this is a safe procedure. °After the procedure, check your pulse often as told by your health care provider. °This information is not intended to replace advice given to you by your health care provider. Make sure you discuss any questions you have with your health care provider. °Document Revised: 04/19/2019 Document Reviewed: 04/19/2019 °Elsevier Patient Education © 2020 [employer]  °

## 2023-07-23 NOTE — Anesthesia Preprocedure Evaluation (Signed)
Anesthesia Evaluation  Patient identified by MRN, date of birth, ID band Patient awake    Reviewed: Allergy & Precautions, NPO status , Patient's Chart, lab work & pertinent test results, reviewed documented beta blocker date and time   History of Anesthesia Complications Negative for: history of anesthetic complications  Airway Mallampati: III  TM Distance: >3 FB     Dental no notable dental hx.    Pulmonary neg shortness of breath, pneumonia, resolved, neg COPD, neg recent URI   breath sounds clear to auscultation       Cardiovascular hypertension, (-) angina (-) CAD, (-) Past MI and (-) Cardiac Stents + Valvular Problems/Murmurs (s/p MVr)  Rhythm:Irregular Rate:Tachycardia     Neuro/Psych neg Seizures    GI/Hepatic ,GERD  ,,(+) neg Cirrhosis        Endo/Other    Renal/GU Renal disease     Musculoskeletal   Abdominal   Peds  Hematology   Anesthesia Other Findings   Reproductive/Obstetrics                             Anesthesia Physical Anesthesia Plan  ASA: 3  Anesthesia Plan: MAC   Post-op Pain Management:    Induction: Intravenous  PONV Risk Score and Plan: 1 and Ondansetron  Airway Management Planned:   Additional Equipment:   Intra-op Plan:   Post-operative Plan:   Informed Consent: I have reviewed the patients History and Physical, chart, labs and discussed the procedure including the risks, benefits and alternatives for the proposed anesthesia with the patient or authorized representative who has indicated his/her understanding and acceptance.     Dental advisory given  Plan Discussed with: [name]  Anesthesia Plan Comments:        Anesthesia Quick Evaluation

## 2023-07-23 NOTE — Anesthesia Postprocedure Evaluation (Signed)
Anesthesia Post Note  Patient: James [name]  Procedure(s) Performed: CARDIOVERSION     Patient location during evaluation: PACU Anesthesia Type: MAC Level of consciousness: awake and alert Pain management: pain level controlled Vital Signs Assessment: post-procedure vital signs reviewed and stable Respiratory status: spontaneous breathing, nonlabored ventilation, respiratory function stable and patient connected to nasal cannula oxygen Cardiovascular status: stable and blood pressure returned to baseline Postop Assessment: no apparent nausea or vomiting Anesthetic complications: no   No notable events documented.  Last Vitals:  Vitals:   07/23/23 1000 07/23/23 1118  BP: 120/84 121/75  Pulse: 96 80  Resp: (!) 22 (!) 28  Temp:  36.7 C  SpO2: 99% 98%    Last Pain:  Vitals:   07/23/23 1118  TempSrc: Temporal  PainSc: 0-No pain                 Mariann Barter

## 2023-07-24 ENCOUNTER — Encounter (HOSPITAL_COMMUNITY): Payer: Self-pay | Admitting: Cardiology

## 2023-07-28 ENCOUNTER — Telehealth: Payer: Self-pay | Admitting: Cardiovascular Disease

## 2023-07-28 NOTE — Telephone Encounter (Signed)
Patient had cardioversion on Wednesday. Patient stated he was doing fine until Friday. Patient stated he started going back into A. FIB on Friday with his  HR 95 to 125. [place] the beach [place], and would like to know what he should do. Patient stated he would come back if he needs to. Patient stated he usually does not feel when he is in A. FIB, and the only reason he know he is in A. FIB is through his Apple Watch. Will forward to Dr. Nelly Laurence and his nurse for advisement.

## 2023-07-28 NOTE — Telephone Encounter (Signed)
Patient c/o Palpitations:  STAT if patient reporting lightheadedness, shortness of breath, or chest pain  How long have you had palpitations/irregular HR/ Afib? Are you having the symptoms now? Patient is in Afib at this time- he had a Cardioversion on last Wednesday- he said he went back in Afib on Friday,been in Afib every since  Are you currently experiencing lightheadedness, SOB or CP? no  Do you have a history of afib (atrial fibrillation) or irregular heart rhythm?   Have you checked your BP or HR? (document readings if available):   Are you experiencing any other symptoms? no

## 2023-07-28 NOTE — Telephone Encounter (Signed)
Spoke with patient, currently in AF according to watch. Currently asymptomatic and heart rate ~95. Patient states he was only aware he went back into AF from his watch alerting him. Confirmed patient still taking his eliquis and metoprolol as prescribed. Appointment with Canary Brim already scheduled for 11/11 as a follow up from the DCCV. No needs at this time
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
## 2023-08-11 ENCOUNTER — Telehealth: Payer: Self-pay | Admitting: Cardiology

## 2023-08-11 ENCOUNTER — Ambulatory Visit: Payer: Medicare Other | Attending: Pulmonary Disease | Admitting: Pulmonary Disease

## 2023-08-11 ENCOUNTER — Encounter: Payer: Self-pay | Admitting: Pulmonary Disease

## 2023-08-11 ENCOUNTER — Encounter: Payer: Self-pay | Admitting: *Deleted

## 2023-08-11 VITALS — BP 120/78 | HR 109 | Ht 71.0 in | Wt 190.6 lb

## 2023-08-11 DIAGNOSIS — I10 Essential (primary) hypertension: Secondary | ICD-10-CM

## 2023-08-11 DIAGNOSIS — Z9889 Other specified postprocedural states: Secondary | ICD-10-CM | POA: Diagnosis not present

## 2023-08-11 DIAGNOSIS — D6869 Other thrombophilia: Secondary | ICD-10-CM

## 2023-08-11 DIAGNOSIS — I4819 Other persistent atrial fibrillation: Secondary | ICD-10-CM

## 2023-08-11 DIAGNOSIS — Z09 Encounter for follow-up examination after completed treatment for conditions other than malignant neoplasm: Secondary | ICD-10-CM

## 2023-08-11 MED ORDER — AMIODARONE HCL 200 MG PO TABS: ORAL_TABLET | ORAL | 0 refills | Status: DC

## 2023-08-11 NOTE — Patient Instructions (Addendum)
Medication Instructions:  Start Amiodrone (200 mg tablets) Take 400 mg (2 tablets) twice daily for 14 days Take 200 mg (1 tablet) twice daily for 14 days Take 200 mg (1 tablet) daily thereafter *If you need a refill on your cardiac medications before your next appointment, please call your pharmacy*  Lab Work: CMET, CBC,TSH-TODAY If you have labs (blood work) drawn today and your tests are completely normal, you will receive your results only by: MyChart Message (if you have MyChart) OR A paper copy in the mail If you have any lab test that is abnormal or we need to change your treatment, we will call you to review the results.  Testing/Procedures: Your provider has recommended that you have a Cardioversion (DCCV). Electrical Cardioversion uses a jolt of electricity to your heart either through paddles or wired patches attached to your chest. This is a controlled, usually prescheduled, procedure. Defibrillation is done under light anesthesia in the hospital, and you usually go home the day of the procedure. This is done to get your heart back into a normal rhythm. You are not awake for the procedure. Please see the instruction sheet given to you today.    Follow-Up: At [facility], you and your health needs are our priority.  As part of our continuing mission to provide you with exceptional heart care, we have created designated Provider Care Teams.  These Care Teams include your primary Cardiologist (physician) and Advanced Practice Providers (APPs -  Physician Assistants and Nurse Practitioners) who all work together to provide you with the care you need, when you need it.  Your next appointment:   Keep appointment with Dr Nelly Laurence as scheduled to discuss possible ablation

## 2023-08-11 NOTE — Telephone Encounter (Signed)
Pt c/o medication issue:  1. Name of Medication: amiodarone (PACERONE) 200 MG tablet   2. How are you currently taking this medication (dosage and times per day)?    3. Are you having a reaction (difficulty breathing--STAT)? no  4. What is your medication issue? Patient calling to see if he suppose to continue the medication after the 45 days. Please advise

## 2023-08-12 LAB — CBC
Hematocrit: 40.9 % (ref 37.5–51.0)
Hemoglobin: 14 g/dL (ref 13.0–17.7)
MCH: 35.3 pg — ABNORMAL HIGH (ref 26.6–33.0)
MCHC: 34.2 g/dL (ref 31.5–35.7)
MCV: 103 fL — ABNORMAL HIGH (ref 79–97)
Platelets: 237 10*3/uL (ref 150–450)
RBC: 3.97 x10E6/uL — ABNORMAL LOW (ref 4.14–5.80)
RDW: 12.1 % (ref 11.6–15.4)
WBC: 5.9 10*3/uL (ref 3.4–10.8)

## 2023-08-12 LAB — TSH: TSH: 1.74 u[IU]/mL (ref 0.450–4.500)

## 2023-08-12 LAB — COMPREHENSIVE METABOLIC PANEL
ALT: 18 [IU]/L (ref 0–44)
AST: 17 IU/L (ref 0–40)
Albumin: 4.4 g/dL (ref 3.8–4.8)
Alkaline Phosphatase: 100 [IU]/L (ref 44–121)
BUN/Creatinine Ratio: 10 (ref 10–24)
BUN: 11 mg/dL (ref 8–27)
Bilirubin Total: 0.5 mg/dL (ref 0.0–1.2)
CO2: 23 mmol/L (ref 20–29)
Calcium: 9.6 mg/dL (ref 8.6–10.2)
Chloride: 103 mmol/L (ref 96–106)
Creatinine, Ser: 1.08 mg/dL (ref 0.76–1.27)
Globulin, Total: 2.7 g/dL (ref 1.5–4.5)
Glucose: 90 mg/dL (ref 70–99)
Potassium: 5.3 mmol/L — ABNORMAL HIGH (ref 3.5–5.2)
Sodium: 140 mmol/L (ref 134–144)
Total Protein: 7.1 g/dL (ref 6.0–8.5)
eGFR: 71 mL/min/{1.73_m2} (ref >59)

## 2023-08-12 NOTE — Telephone Encounter (Signed)
Spoke with pt who states he was only prescribed 45 tablets of Amiodarone at his OV yesterday and he will be short tablets to complete his loading dose.  Pt advised to contact office via MyChart a few days before running out of medication so we can send the rest of his loading dose to the pharmacy.  Pt advised he will continue Amiodarone 200mg  - 1 tablet daily after completing his loading dose.  Pt verbalizes understanding and agrees with current plan.

## 2023-08-15 MED ORDER — AMIODARONE HCL 200 MG PO TABS: 200.0000 mg | ORAL_TABLET | Freq: Every day | ORAL | 0 refills | Status: DC

## 2023-08-15 MED ORDER — AMIODARONE HCL 200 MG PO TABS: ORAL_TABLET | ORAL | 0 refills | Status: DC

## 2023-08-15 NOTE — Telephone Encounter (Signed)
Spoke with patient, amiodarone prescription that was sent in on 08/11/23 was only for 45 tablets - not enough to complete loading doses. Confirmed with patient, he stated taking this on 08/11/23 and was able to get 2 doses (4 tablets) that day.  Should be Amiodarone 200 mg: 2 tablet twice daily for 14 days, 1 tablet twice daily for 14 days,  Then decrease to 1 tablet daily thereafter  Will send in 2 prescriptions, one for only 39 tablets to finish out his loading dose and another one for his once daily dosing. Canary Brim, NP made aware in clinic

## 2023-09-01 NOTE — Progress Notes (Signed)
 Spoke to pt and instructed them to come at 0700 and to be NPO after 0000.  Confirmed no missed doses of AC and instructed to take in AM with a small sip of water.  Confirmed that pt will have a ride home and someone to stay with them for 24 hours after the procedure. Instructed patient to not wear any jewelry or lotion.

## 2023-09-02 ENCOUNTER — Ambulatory Visit (HOSPITAL_BASED_OUTPATIENT_CLINIC_OR_DEPARTMENT_OTHER): Payer: Medicare Other | Admitting: Anesthesiology

## 2023-09-02 ENCOUNTER — Other Ambulatory Visit: Payer: Self-pay

## 2023-09-02 ENCOUNTER — Encounter (HOSPITAL_COMMUNITY)
Admission: RE | Disposition: A | Discharge status: Home / Self Care | Payer: Self-pay | Source: Home / Self Care | Attending: Cardiology

## 2023-09-02 ENCOUNTER — Ambulatory Visit (HOSPITAL_COMMUNITY)
Admission: RE | Admit: 2023-09-02 | Discharge: 2023-09-02 | Disposition: A | Discharge status: Home / Self Care | Payer: Medicare Other | Attending: Cardiology | Admitting: Cardiology

## 2023-09-02 ENCOUNTER — Ambulatory Visit (HOSPITAL_COMMUNITY): Payer: Medicare Other | Admitting: Anesthesiology

## 2023-09-02 DIAGNOSIS — I4891 Unspecified atrial fibrillation: Secondary | ICD-10-CM

## 2023-09-02 DIAGNOSIS — I050 Rheumatic mitral stenosis: Secondary | ICD-10-CM | POA: Insufficient documentation

## 2023-09-02 DIAGNOSIS — I4819 Other persistent atrial fibrillation: Secondary | ICD-10-CM | POA: Insufficient documentation

## 2023-09-02 DIAGNOSIS — Z7901 Long term (current) use of anticoagulants: Secondary | ICD-10-CM | POA: Insufficient documentation

## 2023-09-02 DIAGNOSIS — I10 Essential (primary) hypertension: Secondary | ICD-10-CM | POA: Insufficient documentation

## 2023-09-02 DIAGNOSIS — D6869 Other thrombophilia: Secondary | ICD-10-CM | POA: Insufficient documentation

## 2023-09-02 HISTORY — PX: CARDIOVERSION: EP1203

## 2023-09-02 SURGERY — CARDIOVERSION (CATH LAB)
Anesthesia: Monitor Anesthesia Care

## 2023-09-02 MED ORDER — PROPOFOL 10 MG/ML IV BOLUS
INTRAVENOUS | Status: DC | PRN
  Administered 2023-09-02: 20 mg via INTRAVENOUS
  Administered 2023-09-02: 70 mg via INTRAVENOUS

## 2023-09-02 MED ORDER — ATROPINE SULFATE 1 MG/10ML IJ SOSY
PREFILLED_SYRINGE | INTRAMUSCULAR | Status: AC
  Filled 2023-09-02: qty 10

## 2023-09-02 MED ORDER — SODIUM CHLORIDE 0.9 % IV SOLN: INTRAVENOUS | Status: DC

## 2023-09-02 MED ORDER — ATROPINE SULFATE 1 MG/10ML IJ SOSY
0.5000 mg | PREFILLED_SYRINGE | Freq: Once | INTRAMUSCULAR | Status: AC
  Administered 2023-09-02: 0.5 mg via INTRAVENOUS

## 2023-09-02 MED ORDER — LIDOCAINE 2% (20 MG/ML) 5 ML SYRINGE
INTRAMUSCULAR | Status: DC | PRN
  Administered 2023-09-02: 60 mg via INTRAVENOUS

## 2023-09-02 SURGICAL SUPPLY — 1 items: PAD DEFIB RADIO PHYSIO CONN (PAD) ×1 IMPLANT

## 2023-09-02 NOTE — Transfer of Care (Signed)
Immediate Anesthesia Transfer of Care Note  Patient: James [name]  Procedure(s) Performed: CARDIOVERSION (CATH LAB)  Patient Location: PACU  Anesthesia Type:MAC  Level of Consciousness: awake, alert , and oriented  Airway & Oxygen Therapy: Patient Spontanous Breathing  Post-op Assessment: Report given to RN  Post vital signs: Reviewed and stable  Last Vitals:  Vitals Value Taken Time  BP    Temp    Pulse    Resp    SpO2      Last Pain:  Vitals:   09/02/23 0730  TempSrc: Temporal         Complications: No notable events documented.

## 2023-09-02 NOTE — Progress Notes (Signed)
Pt given a bolus of fluids for soft BP (see flowsheets) and BP came up to 113/65, no c/o of dizziness. Pt ambulated in room while I was present and had no fluctuations in BP or HR and no symptoms of hypotension or bradycardia. Pt states "I feel fine and good to go home". Pt's wife Drenda Freeze called and instructed to keep a close eye on pt and to call 911 or return to the ED if anything abnormal such as dizziness, nausea or sweating occurred. Both pt and wife verbally stated understanding.

## 2023-09-02 NOTE — Anesthesia Postprocedure Evaluation (Signed)
Anesthesia Post Note  Patient: James [name]  Procedure(s) Performed: CARDIOVERSION (CATH LAB)     Patient location during evaluation: PACU Anesthesia Type: MAC Level of consciousness: awake and alert Pain management: pain level controlled Vital Signs Assessment: post-procedure vital signs reviewed and stable Respiratory status: spontaneous breathing, nonlabored ventilation, respiratory function stable and patient connected to nasal cannula oxygen Cardiovascular status: stable and blood pressure returned to baseline Postop Assessment: no apparent nausea or vomiting Anesthetic complications: no   No notable events documented.  Last Vitals:  Vitals:   09/02/23 1030 09/02/23 1040  BP: 94/60 (!) 97/52  Pulse: (!) 37 (!) 42  Resp: 20 (!) 22  Temp:    SpO2: 96% 96%    Last Pain:  Vitals:   09/02/23 0945  TempSrc: Temporal  PainSc: 0-No pain                 Mariann Barter

## 2023-09-02 NOTE — CV Procedure (Signed)
   Electrical Cardioversion Procedure Note James [name] [id number] [birth year]  Procedure: Electrical Cardioversion Indications:  atrial fibrillation   Time Out: Verified patient identification, verified procedure,medications/allergies/relevent history reviewed, required imaging and test results available. performed  Procedure Details  The patient signed informed consent.   The patient was NPO past midnight. Has had therapeutic anticoagulation with Eliquis greater than 3 weeks. The patient denies any interruption of anticoagulation.  Anesthesia was administered by Dr. Ace Gins.  Adequate airway was maintained throughout and vital followed per protocol.  He was cardioverted x 1 with 200J of biphasic synchronized energy.  He converted to NSR.  There were no apparent complications.  The patient tolerated the procedure well and had normal neuro status and respiratory status post procedure with vitals stable as recorded elsewhere.     IMPRESSION:  Successful cardioversion of atrial fibrillation   Follow up:  We will arrange follow up with primary cardiologist.  He will continue on current medical therapy.  The patient advised to continue anticoagulation.  James [name] 09/02/2023, 8:36 AM

## 2023-09-02 NOTE — Progress Notes (Signed)
Pt HR 28, pt feeling lightheaded. Dr. Servando Salina notified and 0.5mg  of Atropine ordered. Given per order. Pt HR now 41 and no c/o lightheadedness.

## 2023-09-02 NOTE — Interval H&P Note (Signed)
History and Physical Interval Note:  09/02/2023 8:28 AM  James [name]  has presented today for surgery, with the diagnosis of AFIB.  The various methods of treatment have been discussed with the patient and family. After consideration of risks, benefits and other options for treatment, the patient has consented to  Procedure(s): CARDIOVERSION (CATH LAB) (N/A) as a surgical intervention.  The patient's history has been reviewed, patient examined, no change in status, stable for surgery.  I have reviewed the patient's chart and labs.  Questions were answered to the patient's satisfaction.     [name]

## 2023-09-02 NOTE — Anesthesia Preprocedure Evaluation (Signed)
Anesthesia Evaluation  Patient identified by MRN, date of birth, ID band Patient awake    Reviewed: Allergy & Precautions, NPO status , Patient's Chart, lab work & pertinent test results, reviewed documented beta blocker date and time   History of Anesthesia Complications Negative for: history of anesthetic complications  Airway Mallampati: III  TM Distance: >3 FB   Mouth opening: Limited Mouth Opening  Dental no notable dental hx.    Pulmonary neg sleep apnea, pneumonia   breath sounds clear to auscultation       Cardiovascular hypertension, (-) CAD, (-) Past MI and (-) Cardiac Stents + dysrhythmias Atrial Fibrillation + Valvular Problems/Murmurs (s/p mvr)  Rhythm:Irregular Rate:Normal     Neuro/Psych neg Seizures    GI/Hepatic ,GERD  ,,(+) neg Cirrhosis        Endo/Other    Renal/GU Renal disease     Musculoskeletal   Abdominal   Peds  Hematology   Anesthesia Other Findings   Reproductive/Obstetrics                              Anesthesia Physical Anesthesia Plan  ASA: 3  Anesthesia Plan: MAC   Post-op Pain Management:    Induction: Intravenous  PONV Risk Score and Plan: 2 and Propofol infusion  Airway Management Planned:   Additional Equipment:   Intra-op Plan:   Post-operative Plan:   Informed Consent: I have reviewed the patients History and Physical, chart, labs and discussed the procedure including the risks, benefits and alternatives for the proposed anesthesia with the patient or authorized representative who has indicated his/her understanding and acceptance.     Dental advisory given  Plan Discussed with: [name]  Anesthesia Plan Comments:          Anesthesia Quick Evaluation

## 2023-09-03 ENCOUNTER — Telehealth: Payer: Self-pay | Admitting: Cardiovascular Disease

## 2023-09-03 ENCOUNTER — Encounter (HOSPITAL_COMMUNITY): Payer: Self-pay | Admitting: Cardiology

## 2023-09-03 NOTE — Telephone Encounter (Signed)
Pt advised to STOP Toprol per Dr. Nelly Laurence. Advised to continue monitoring his HRs. Advised to call office if HRs do not improve or they become elevated (greater than 100 bpm) and/or lightheadedness worsens or other symptoms arise. Patient verbalized understanding and agreeable to plan.

## 2023-09-03 NOTE — Telephone Encounter (Signed)
STAT if HR is under 50 or over 120 (normal HR is 60-100 beats per minute)  What is your heart rate? 43  Do you have a log of your heart rate readings (document readings)? 35-45  Do you have any other symptoms? Lightheaded   Patient had a cardioversion yesterday.

## 2023-09-03 NOTE — Telephone Encounter (Signed)
Pt reports bradycardia since DCCV yesterday.  He was instructed by MD there to only take 1/2 of his Toprol today (he takes this medication in the morning).  Patient has not taken it yet and wanted to call to get guidance since HRs still so low. Some light headedness associated with low HRs. BP currently:  120/80 Pt aware I am going to speak to Dr. Nelly Laurence and will call him back w/ recommendation/s. Pt agreeable to plan.

## 2023-09-11 ENCOUNTER — Ambulatory Visit: Payer: Medicare Other | Admitting: Cardiovascular Disease

## 2023-09-15 ENCOUNTER — Encounter: Payer: Self-pay | Admitting: Cardiovascular Disease

## 2023-09-15 ENCOUNTER — Ambulatory Visit: Payer: Medicare Other | Attending: Cardiovascular Disease | Admitting: Cardiovascular Disease

## 2023-09-15 VITALS — BP 126/80 | HR 109 | Ht 71.0 in | Wt 195.2 lb

## 2023-09-15 DIAGNOSIS — I4819 Other persistent atrial fibrillation: Secondary | ICD-10-CM

## 2023-09-15 DIAGNOSIS — I484 Atypical atrial flutter: Secondary | ICD-10-CM

## 2023-09-15 MED ORDER — AMIODARONE HCL 200 MG PO TABS: ORAL_TABLET | ORAL | 3 refills | Status: DC

## 2023-09-15 NOTE — Progress Notes (Signed)
Electrophysiology Office Note:    Date:  09/15/2023   ID:  James [name], DOB December 27, 1945, MRN [id number]  PCP:  Emilio Aspen, MD   [facility] Providers Cardiologist:  Donato Schultz, MD Electrophysiologist:  Maurice Small, MD     Referring MD: Emilio Aspen, *   History of Present Illness:    James [name] is a 77 y.o. male with a hx of Mitral valve repair in 2014significant for atrial fibrillation, referred for arrhythmia management.  He has been followed by Dr. Johney Frame in the past and underwent atrial flutter ablation in 2018.  He was noted by his PCP to have an irregular rhythm and referred back for management of atrial flutter.  He has been managed in atrial fibrillation clinic.  He underwent a cardioversion in January.  In follow-up in May 2024 he was noted to be in atrial flutter and underwent DC cardioversion subsequently.  He has not had any symptoms with these atrial fibrillation episodes.  He underwent DC cardioversions in October 2024 and again in December 2024.  He has had recurrence of atrial fibrillation within a short time.  He was given amiodarone prior to the most recent cardioversion.     Current Medications: Current Meds  Medication Sig   alfuzosin (UROXATRAL) 10 MG 24 hr tablet Take 10 mg by mouth daily with breakfast.   allopurinol (ZYLOPRIM) 100 MG tablet Take 100 mg by mouth 2 (two) times daily.   amiodarone (PACERONE) 200 MG tablet Take 1 tablet (200 mg total) by mouth daily. Start once daily dosing on 09/08/23   apixaban (ELIQUIS) 5 MG TABS tablet Take 5 mg by mouth 2 (two) times daily.   atorvastatin (LIPITOR) 20 MG tablet Take 20 mg by mouth at bedtime.   Camphor-Menthol-Methyl Sal (SALONPAS) 3.10-05-08 % PTCH Apply 1 application  topically daily as needed (Back pain).   Cholecalciferol (VITAMIN D3 PO) Take 1 tablet by mouth once a week.   Coenzyme Q10 (CO Q-10) 100 MG CAPS Take 100 mg by mouth daily.   ELDERBERRY PO Take 1  capsule by mouth once a week.   fish oil-omega-3 fatty acids 1000 MG capsule Take 1 g by mouth daily.    fluticasone (FLONASE) 50 MCG/ACT nasal spray Place 2 sprays into both nostrils daily as needed for allergies or rhinitis.    MAGNESIUM PO Take 1 tablet by mouth once a week.   Multiple Vitamin (MULTIVITAMIN WITH MINERALS) TABS Take 1 tablet by mouth daily. Centrum Silver   Multiple Vitamins-Minerals (ZINC PO) Take 1 tablet by mouth once a week.   omeprazole (PRILOSEC OTC) 20 MG tablet Take 20 mg every evening by mouth.    sildenafil (REVATIO) 20 MG tablet Take 40-60 mg by mouth daily as needed (ED).   telmisartan (MICARDIS) 40 MG tablet Take 40 mg by mouth daily.      EKGs/Labs/Other Studies Reviewed Today:    Echocardiogram:  TTE 02/18/2023 LVEF 60-65%, moderate LVH. Severely dilated left atrium. S/p MVR with mild mitral stenosis   Monitors:   Stress testing:   Advanced imaging:   Cardiac catherization   EKG:  Last EKG results: today - sinus rhythm with first degree AV block    Physical Exam:    VS:  BP 126/80 (BP Location: Left Arm, Patient Position: Sitting, Cuff Size: Large)   Pulse (!) 109   Ht 5\' 11"  (1.803 m)   Wt 195 lb 3.2 oz (88.5 kg)   SpO2 97%   BMI 27.22  kg/m     Wt Readings from Last 3 Encounters:  09/15/23 195 lb 3.2 oz (88.5 kg)  09/02/23 185 lb (83.9 kg)  08/11/23 190 lb 9.6 oz (86.5 kg)     GEN: Well nourished, well developed in no acute distress CARDIAC: RRR, no murmurs, rubs, gallops RESPIRATORY:  Normal work of breathing MUSCULOSKELETAL: no edema    ASSESSMENT & PLAN:    Persistent atrial fibrillation Mild, if any symptoms Now with recurrence of persistent A-fib and early recurrence after cardioversion Continue apixaban for now due to ease of periprocedural management; I think he will need to switch to warfarin given history of mitral stenosis.  We discussed options for management.  After discussion, he would like to proceed with  repeat atrial fibrillation ablation. Will resume amiodarone and schedule cardioversion as a bridge to ablation. He will need to hold metoprolol for 48 hours prior to cardioversion due to his history of sinus bradycardia.  We discussed the indication, rationale, logistics, anticipated benefits, and potential risks of the ablation procedure including but not limited to -- bleed at the groin access site, chest pain, damage to nearby organs, need for a drainage tube, or prolonged hospitalization. I explained that the risk for stroke, heart attack, need for open chest surgery, or even death is very low but not zero. he  expressed understanding and wishes to proceed.   Secondary hypercoagulable state Continue apixaban 5 mg  S/P mitral valve repair for mitral stenosis Mild MS      Signed, Maurice Small, MD  09/15/2023 1:58 PM    [facility]

## 2023-09-15 NOTE — Patient Instructions (Addendum)
Medication Instructions:  RE-START Amiodarone 200 mg twice daily for 14 days, then decrease to 1 tablet once daily  *If you need a refill on your cardiac medications before your next appointment, please call your pharmacy*   Testing/Procedures: Cardioversion - see instruction letter Your physician has recommended that you have a Cardioversion (DCCV). Electrical Cardioversion uses a jolt of electricity to your heart either through paddles or wired patches attached to your chest. This is a controlled, usually prescheduled, procedure. Defibrillation is done under light anesthesia in the hospital, and you usually go home the day of the procedure. This is done to get your heart back into a normal rhythm. You are not awake for the procedure. Please see the instruction sheet given to you today.  Cardiac CT - we will contact you to set this up prior to ablation  Your physician has requested that you have cardiac CT. Cardiac computed tomography (CT) is a painless test that uses an x-ray machine to take clear, detailed pictures of your heart. For further information please visit [email or web address] Please follow instruction sheet as given.   Atrial Fibrillation Ablation - scheduled for Monday, December 22, 2023 Your physician has recommended that you have an ablation. Catheter ablation is a medical procedure used to treat some cardiac arrhythmias (irregular heartbeats). During catheter ablation, a long, thin, flexible tube is put into a blood vessel in your groin (upper thigh), or neck. This tube is called an ablation catheter. It is then guided to your heart through the blood vessel. Radio frequency waves destroy small areas of heart tissue where abnormal heartbeats may cause an arrhythmia to start. Please see the instruction sheet given to you today.    Follow-Up: At [facility], you and your health needs are our priority.  As part of our continuing mission to provide you with exceptional heart  care, we have created designated Provider Care Teams.  These Care Teams include your primary Cardiologist (physician) and Advanced Practice Providers (APPs -  Physician Assistants and Nurse Practitioners) who all work together to provide you with the care you need, when you need it.  We recommend signing up for the patient portal called "MyChart".  Sign up information is provided on this After Visit Summary.  MyChart is used to connect with patients for Virtual Visits (Telemedicine).  Patients are able to view lab/test results, encounter notes, upcoming appointments, etc.  Non-urgent messages can be sent to your provider as well.   To learn more about what you can do with MyChart, go to [email or web address].    Your next appointment:   We will schedule follow up after your ablation   Provider:   York Pellant, MD

## 2023-10-02 NOTE — Progress Notes (Signed)
 Spoke to patient and instructed them to come at 0745  and to be NPO after 0000.  Medications reviewed.    Confirmed that patient will have a ride home and someone to stay with them for 24 hours after the procedure.

## 2023-10-02 NOTE — Anesthesia Preprocedure Evaluation (Addendum)
 Anesthesia Evaluation  Patient identified by MRN, date of birth, ID band Patient awake    Reviewed: Allergy & Precautions, NPO status , Patient's Chart, lab work & pertinent test results  History of Anesthesia Complications Negative for: history of anesthetic complications  Airway Mallampati: III  TM Distance: >3 FB Neck ROM: Full    Dental  (+) Dental Advisory Given   Pulmonary neg pulmonary ROS   Pulmonary exam normal        Cardiovascular hypertension, Pt. on medications and Pt. on home beta blockers + dysrhythmias Atrial Fibrillation + Valvular Problems/Murmurs (s/p MV repair)  Rhythm:Irregular Rate:Tachycardia   '24 TTE - EF 60 to 65%. There is moderate left ventricular hypertrophy. Left atrial size was severely dilated. Right atrial size was moderately dilated. The mitral valve has been repaired/replaced. No evidence of mitral valve regurgitation. Mild mitral stenosis. The mean mitral valve gradient is 4.0 mmHg. There is a 32 mm prosthetic annuloplasty ring present in the mitral position. Aortic valve regurgitation is trivial. There is mild dilatation of the aortic root, measuring 43 mm.      Neuro/Psych negative neurological ROS  negative psych ROS   GI/Hepatic Neg liver ROS,GERD  Medicated and Controlled,,  Endo/Other  negative endocrine ROS    Renal/GU negative Renal ROS     Musculoskeletal negative musculoskeletal ROS (+)    Abdominal   Peds  Hematology  On eliquis     Anesthesia Other Findings   Reproductive/Obstetrics                             Anesthesia Physical Anesthesia Plan  ASA: 3  Anesthesia Plan: General   Post-op Pain Management: Minimal or no pain anticipated   Induction: Intravenous  PONV Risk Score and Plan: 2 and Treatment may vary due to age or medical condition and Propofol  infusion  Airway Management Planned: Natural Airway and Mask  Additional  Equipment: None  Intra-op Plan:   Post-operative Plan:   Informed Consent: I have reviewed the patients History and Physical, chart, labs and discussed the procedure including the risks, benefits and alternatives for the proposed anesthesia with the patient or authorized representative who has indicated his/her understanding and acceptance.       Plan Discussed with: [name] and Anesthesiologist  Anesthesia Plan Comments:         Anesthesia Quick Evaluation

## 2023-10-03 ENCOUNTER — Encounter (HOSPITAL_COMMUNITY)
Admission: RE | Disposition: A | Discharge status: Home / Self Care | Payer: Self-pay | Source: Home / Self Care | Attending: Internal Medicine

## 2023-10-03 ENCOUNTER — Ambulatory Visit (HOSPITAL_COMMUNITY): Payer: Medicare Other

## 2023-10-03 ENCOUNTER — Other Ambulatory Visit: Payer: Self-pay

## 2023-10-03 ENCOUNTER — Ambulatory Visit (HOSPITAL_COMMUNITY)
Admission: RE | Admit: 2023-10-03 | Discharge: 2023-10-03 | Disposition: A | Discharge status: Home / Self Care | Payer: Medicare Other | Attending: Internal Medicine | Admitting: Internal Medicine

## 2023-10-03 DIAGNOSIS — I050 Rheumatic mitral stenosis: Secondary | ICD-10-CM | POA: Insufficient documentation

## 2023-10-03 DIAGNOSIS — I340 Nonrheumatic mitral (valve) insufficiency: Secondary | ICD-10-CM

## 2023-10-03 DIAGNOSIS — D6869 Other thrombophilia: Secondary | ICD-10-CM | POA: Insufficient documentation

## 2023-10-03 DIAGNOSIS — Z7901 Long term (current) use of anticoagulants: Secondary | ICD-10-CM | POA: Insufficient documentation

## 2023-10-03 DIAGNOSIS — Z9889 Other specified postprocedural states: Secondary | ICD-10-CM

## 2023-10-03 DIAGNOSIS — I4892 Unspecified atrial flutter: Secondary | ICD-10-CM

## 2023-10-03 DIAGNOSIS — I4891 Unspecified atrial fibrillation: Secondary | ICD-10-CM

## 2023-10-03 DIAGNOSIS — I059 Rheumatic mitral valve disease, unspecified: Secondary | ICD-10-CM

## 2023-10-03 DIAGNOSIS — Z79899 Other long term (current) drug therapy: Secondary | ICD-10-CM | POA: Insufficient documentation

## 2023-10-03 DIAGNOSIS — I4819 Other persistent atrial fibrillation: Secondary | ICD-10-CM | POA: Insufficient documentation

## 2023-10-03 DIAGNOSIS — E785 Hyperlipidemia, unspecified: Secondary | ICD-10-CM

## 2023-10-03 DIAGNOSIS — I10 Essential (primary) hypertension: Secondary | ICD-10-CM | POA: Diagnosis not present

## 2023-10-03 DIAGNOSIS — I480 Paroxysmal atrial fibrillation: Secondary | ICD-10-CM

## 2023-10-03 HISTORY — PX: CARDIOVERSION: EP1203

## 2023-10-03 SURGERY — CARDIOVERSION (CATH LAB)
Anesthesia: General

## 2023-10-03 MED ORDER — LIDOCAINE 2% (20 MG/ML) 5 ML SYRINGE
INTRAMUSCULAR | Status: DC | PRN
  Administered 2023-10-03: 20 mg via INTRAVENOUS

## 2023-10-03 MED ORDER — SODIUM CHLORIDE 0.9 % IV SOLN
INTRAVENOUS | Status: DC
Start: 2023-10-03 — End: 2023-10-03

## 2023-10-03 MED ORDER — PROPOFOL 10 MG/ML IV BOLUS
INTRAVENOUS | Status: DC | PRN
  Administered 2023-10-03: 60 mg via INTRAVENOUS
  Administered 2023-10-03: 80 mg via INTRAVENOUS

## 2023-10-03 SURGICAL SUPPLY — 1 items: PAD DEFIB RADIO PHYSIO CONN (PAD) ×1 IMPLANT

## 2023-10-03 NOTE — Anesthesia Postprocedure Evaluation (Signed)
 Anesthesia Post Note  Patient: James [name]  Procedure(s) Performed: CARDIOVERSION     Patient location during evaluation: PACU Anesthesia Type: General Level of consciousness: awake and alert Pain management: pain level controlled Vital Signs Assessment: post-procedure vital signs reviewed and stable Respiratory status: spontaneous breathing, nonlabored ventilation and respiratory function stable Cardiovascular status: stable and blood pressure returned to baseline Anesthetic complications: no   No notable events documented.  Last Vitals:  Vitals:   10/03/23 0901 10/03/23 0911  BP: 115/68 114/68  Pulse: 77 75  Resp: 19 14  Temp:    SpO2: 96% 96%    Last Pain:  Vitals:   10/03/23 0911  TempSrc:   PainSc: 0-No pain                 [name] Like

## 2023-10-03 NOTE — Interval H&P Note (Signed)
 History and Physical Interval Note:  10/03/2023 8:11 AM  James [name]  has presented today for surgery, with the diagnosis of AFIB.  The various methods of treatment have been discussed with the patient and family. After consideration of risks, benefits and other options for treatment, the patient has consented to  Procedure(s): CARDIOVERSION (N/A) as a surgical intervention.  The patient's history has been reviewed, patient examined, no change in status, stable for surgery.  I have reviewed the patient's chart and labs.  Questions were answered to the patient's satisfaction.     [name]

## 2023-10-03 NOTE — Transfer of Care (Signed)
 Immediate Anesthesia Transfer of Care Note  Patient: James [name]  Procedure(s) Performed: CARDIOVERSION  Patient Location: PACU  Anesthesia Type:General  Level of Consciousness: awake, drowsy, patient cooperative, and responds to stimulation  Airway & Oxygen Therapy: Patient Spontanous Breathing and Patient connected to nasal cannula oxygen  Post-op Assessment: Report given to RN and Post -op Vital signs reviewed and stable  Post vital signs: Reviewed and stable  Last Vitals:  Vitals Value Taken Time  BP    Temp    Pulse 118 10/03/23 0837  Resp 20 10/03/23 0837  SpO2 95 % 10/03/23 0837  Vitals shown include unfiled device data.  Last Pain:  Vitals:   10/03/23 0819  TempSrc:   PainSc: 0-No pain         Complications: No notable events documented.

## 2023-10-03 NOTE — Discharge Instructions (Signed)

## 2023-10-03 NOTE — CV Procedure (Signed)
    CARDIOVERSION NOTE  Procedure: Electrical Cardioversion Indications:  Atrial Fibrillation  Procedure Details:  Consent: Risks of procedure as well as the alternatives and risks of each were explained to the (patient/caregiver).  Consent for procedure obtained.  Time Out: Verified patient identification, verified procedure, site/side was marked, verified correct patient position, special equipment/implants available, medications/allergies/relevent history reviewed, required imaging and test results available.  Performed  Patient placed on cardiac monitor, pulse oximetry, supplemental oxygen as necessary.  Sedation given:  propofol  per anesthesia Pacer pads placed anterior and posterior chest.  Cardioverted 1 time(s).  Cardioverted at 300J biphasic.  Impression: Findings: Post procedure EKG shows: NSR Complications: None Patient did tolerate procedure well.  Plan: Successful DCCV with a single 300J biphasic shock to NSR.  Time Spent Directly with the Patient:  30 minutes   James KYM Maxcy, MD, [facility], FACP  [place]  [facility]  Medical Director of the Advanced Lipid Disorders &  Cardiovascular Risk Reduction Clinic Diplomate of the American Board of Clinical Lipidology Attending Cardiologist  Direct Dial: [phone]  Fax: [phone]  Website:  [name] 10/03/2023, 8:48 AM

## 2023-10-06 ENCOUNTER — Encounter (HOSPITAL_COMMUNITY): Payer: Self-pay | Admitting: Internal Medicine

## 2023-10-08 ENCOUNTER — Telehealth: Payer: Self-pay | Admitting: Cardiovascular Disease

## 2023-10-08 DIAGNOSIS — I4819 Other persistent atrial fibrillation: Secondary | ICD-10-CM

## 2023-10-08 NOTE — Telephone Encounter (Signed)
 Spoke with patient, since cardioversion on 10/03/23 patient states he has been in normal sinus rhythm (monitors with apple watch). Patient states his heart rates have been in the high 40's to low 50's, while asymptomatic, patient discontinued his metoprolol  over the weekend with rate improvement. Will ensure Dr [name] is made aware. Not removing from med list, could utilize if patient returns to AF once again. Instructed patient that amiodarone  is used as a bridge to ablation in order to help maintain sinus rhythm in the meantime. Patient requested to be added to wait list for ablation, moved up to 11/18/23, labs to be completed on 10/27/23. Patient states no changes in his health since last office visit with Dr [name].

## 2023-10-08 NOTE — Telephone Encounter (Signed)
 Pt c/o medication issue:  1. Name of Medication:   metoprolol  succinate (TOPROL -XL) 100 MG 24 hr tablet  amiodarone  (PACERONE ) 200 MG tablet  2. How are you currently taking this medication (dosage and times per day)?   3. Are you having a reaction (difficulty breathing--STAT)?   4. What is your medication issue?    Patient stated he had a recent cardioversion and has been prescribed metoprolol  succinate (TOPROL -XL) 100 MG 24 hr tablet and amiodarone  (PACERONE ) 200 MG tablet.  Patient noted his HR has been trending low and he has not taken metoprolol  since last Saturday.   Patient noted his HR was 140/78 and he keeps a log of his HR readings.   Patient wants advice on next steps.

## 2023-10-16 ENCOUNTER — Telehealth: Payer: Self-pay

## 2023-10-16 NOTE — Telephone Encounter (Signed)
Spoke with patient, agrees to move up ablation to 10/31/23. Patient to have lab work completed on 10/20/23. Orders placed already. Message sent to pre-cert, Salley Hews, and scheduling. Instruction letter sent thru MyChart and mailed at patient request.  Patient states that his there are no new changes in his health since his last office visit with Dr Nelly Laurence on 09/15/23

## 2023-10-21 LAB — BASIC METABOLIC PANEL
BUN/Creatinine Ratio: 13 (ref 10–24)
BUN: 14 mg/dL (ref 8–27)
CO2: 22 mmol/L (ref 20–29)
Calcium: 8.9 mg/dL (ref 8.6–10.2)
Chloride: 101 mmol/L (ref 96–106)
Creatinine, Ser: 1.11 mg/dL (ref 0.76–1.27)
Glucose: 94 mg/dL (ref 70–99)
Potassium: 4 mmol/L (ref 3.5–5.2)
Sodium: 137 mmol/L (ref 134–144)
eGFR: 68 mL/min/{1.73_m2} (ref >59)

## 2023-10-21 LAB — CBC
Hematocrit: 39.6 % (ref 37.5–51.0)
Hemoglobin: 13.3 g/dL (ref 13.0–17.7)
MCH: 33.5 pg — ABNORMAL HIGH (ref 26.6–33.0)
MCHC: 33.6 g/dL (ref 31.5–35.7)
MCV: 100 fL — ABNORMAL HIGH (ref 79–97)
Platelets: 171 10*3/uL (ref 150–450)
RBC: 3.97 x10E6/uL — ABNORMAL LOW (ref 4.14–5.80)
RDW: 12.4 % (ref 11.6–15.4)
WBC: 4.2 10*3/uL (ref 3.4–10.8)

## 2023-10-22 ENCOUNTER — Encounter: Payer: Self-pay | Admitting: Cardiovascular Disease

## 2023-10-30 NOTE — Pre-Procedure Instructions (Signed)
Instructed patient on the following items: Arrival time 1000 Nothing to eat or drink after midnight No meds AM of procedure Responsible person to drive you home and stay with you for 24 hrs  Have you missed any doses of anti-coagulant Eliquis- takes twice a day, hasn't missed any doses.  Don't take dose in the morning.

## 2023-10-30 NOTE — Anesthesia Preprocedure Evaluation (Addendum)
Anesthesia Evaluation  Patient identified by MRN, date of birth, ID band Patient awake    Reviewed: Allergy & Precautions, NPO status , Patient's Chart, lab work & pertinent test results, reviewed documented beta blocker date and time   Airway Mallampati: III  TM Distance: >3 FB Neck ROM: Full    Dental  (+) Dental Advisory Given, Chipped,    Pulmonary neg pulmonary ROS   Pulmonary exam normal breath sounds clear to auscultation       Cardiovascular hypertension, Pt. on home beta blockers and Pt. on medications Normal cardiovascular exam+ dysrhythmias (eliquis) Atrial Fibrillation + Valvular Problems/Murmurs (severe MR s/p MV repair)  Rhythm:Regular Rate:Normal  TTE 2024  1. Left ventricular ejection fraction, by estimation, is 60 to 65%. The  left ventricle has normal function. The left ventricle has no regional  wall motion abnormalities. There is moderate left ventricular hypertrophy.  Left ventricular diastolic  parameters are indeterminate.   2. Right ventricular systolic function is normal. The right ventricular  size is normal. There is normal pulmonary artery systolic pressure. The  estimated right ventricular systolic pressure is 22.9 mmHg.   3. Left atrial size was severely dilated.   4. Right atrial size was moderately dilated.   5. The mitral valve has been repaired/replaced. No evidence of mitral  valve regurgitation. Mild mitral stenosis. The mean mitral valve gradient  is 4.0 mmHg. There is a 32 mm prosthetic annuloplasty ring present in the  mitral position. Procedure Date:  01/06/2013. Echo findings are consistent with normal structure and function  of the mitral valve prosthesis.   6. The aortic valve is normal in structure. Aortic valve regurgitation is  trivial. No aortic stenosis is present.   7. Aortic dilatation noted. There is mild dilatation of the aortic root,  measuring 43 mm.   8. The inferior vena  cava is normal in size with greater than 50%  respiratory variability, suggesting right atrial pressure of 3 mmHg.     Neuro/Psych negative neurological ROS  negative psych ROS   GI/Hepatic Neg liver ROS,GERD  ,,  Endo/Other  negative endocrine ROS    Renal/GU negative Renal ROS  negative genitourinary   Musculoskeletal negative musculoskeletal ROS (+)    Abdominal   Peds  Hematology negative hematology ROS (+)   Anesthesia Other Findings   Reproductive/Obstetrics                             Anesthesia Physical Anesthesia Plan  ASA: 3  Anesthesia Plan: General   Post-op Pain Management: Minimal or no pain anticipated   Induction: Intravenous  PONV Risk Score and Plan: 2 and Dexamethasone, Ondansetron and Treatment may vary due to age or medical condition  Airway Management Planned: Oral ETT  Additional Equipment:   Intra-op Plan:   Post-operative Plan: Extubation in OR  Informed Consent: I have reviewed the patients History and Physical, chart, labs and discussed the procedure including the risks, benefits and alternatives for the proposed anesthesia with the patient or authorized representative who has indicated his/her understanding and acceptance.     Dental advisory given  Plan Discussed with: [name]  Anesthesia Plan Comments:        Anesthesia Quick Evaluation

## 2023-10-31 ENCOUNTER — Encounter (HOSPITAL_COMMUNITY)
Admission: RE | Disposition: A | Discharge status: Home / Self Care | Payer: Self-pay | Source: Home / Self Care | Attending: Cardiovascular Disease

## 2023-10-31 ENCOUNTER — Ambulatory Visit (HOSPITAL_BASED_OUTPATIENT_CLINIC_OR_DEPARTMENT_OTHER): Payer: Medicare Other

## 2023-10-31 ENCOUNTER — Ambulatory Visit (HOSPITAL_BASED_OUTPATIENT_CLINIC_OR_DEPARTMENT_OTHER): Payer: Medicare Other | Admitting: Anesthesiology

## 2023-10-31 ENCOUNTER — Ambulatory Visit (HOSPITAL_COMMUNITY): Payer: Medicare Other | Admitting: Anesthesiology

## 2023-10-31 ENCOUNTER — Observation Stay (HOSPITAL_COMMUNITY)
Admission: RE | Admit: 2023-10-31 | Discharge: 2023-11-01 | Disposition: A | Discharge status: Home / Self Care | Payer: Medicare Other | Attending: Cardiology | Admitting: Cardiology

## 2023-10-31 ENCOUNTER — Other Ambulatory Visit: Payer: Self-pay

## 2023-10-31 DIAGNOSIS — I4892 Unspecified atrial flutter: Secondary | ICD-10-CM | POA: Insufficient documentation

## 2023-10-31 DIAGNOSIS — I495 Sick sinus syndrome: Secondary | ICD-10-CM | POA: Insufficient documentation

## 2023-10-31 DIAGNOSIS — E785 Hyperlipidemia, unspecified: Secondary | ICD-10-CM

## 2023-10-31 DIAGNOSIS — K219 Gastro-esophageal reflux disease without esophagitis: Secondary | ICD-10-CM | POA: Diagnosis present

## 2023-10-31 DIAGNOSIS — I4891 Unspecified atrial fibrillation: Secondary | ICD-10-CM | POA: Diagnosis not present

## 2023-10-31 DIAGNOSIS — I340 Nonrheumatic mitral (valve) insufficiency: Secondary | ICD-10-CM

## 2023-10-31 DIAGNOSIS — Z79899 Other long term (current) drug therapy: Secondary | ICD-10-CM | POA: Diagnosis not present

## 2023-10-31 DIAGNOSIS — Z7901 Long term (current) use of anticoagulants: Secondary | ICD-10-CM | POA: Insufficient documentation

## 2023-10-31 DIAGNOSIS — Z9889 Other specified postprocedural states: Secondary | ICD-10-CM

## 2023-10-31 DIAGNOSIS — D6869 Other thrombophilia: Secondary | ICD-10-CM | POA: Diagnosis not present

## 2023-10-31 DIAGNOSIS — I4819 Other persistent atrial fibrillation: Principal | ICD-10-CM | POA: Insufficient documentation

## 2023-10-31 DIAGNOSIS — R001 Bradycardia, unspecified: Secondary | ICD-10-CM | POA: Diagnosis present

## 2023-10-31 DIAGNOSIS — I10 Essential (primary) hypertension: Secondary | ICD-10-CM | POA: Diagnosis present

## 2023-10-31 HISTORY — PX: ATRIAL FIBRILLATION ABLATION: EP1191

## 2023-10-31 HISTORY — PX: TRANSESOPHAGEAL ECHOCARDIOGRAM (CATH LAB): EP1270

## 2023-10-31 LAB — POCT ACTIVATED CLOTTING TIME: Activated Clotting Time: 256 s

## 2023-10-31 LAB — ECHO TEE

## 2023-10-31 SURGERY — ATRIAL FIBRILLATION ABLATION
Anesthesia: General

## 2023-10-31 MED ORDER — AMIODARONE HCL 200 MG PO TABS
200.0000 mg | ORAL_TABLET | Freq: Every day | ORAL | Status: DC
  Administered 2023-10-31 – 2023-11-01 (×2): 200 mg via ORAL
  Filled 2023-10-31 (×2): qty 1

## 2023-10-31 MED ORDER — SODIUM CHLORIDE 0.9 % IV SOLN: 250.0000 mL | INTRAVENOUS | Status: DC | PRN

## 2023-10-31 MED ORDER — ALBUMIN HUMAN 5 % IV SOLN
INTRAVENOUS | Status: AC
  Administered 2023-10-31: 12.5 g via INTRAVENOUS
  Filled 2023-10-31: qty 250

## 2023-10-31 MED ORDER — ONDANSETRON HCL 4 MG/2ML IJ SOLN
INTRAMUSCULAR | Status: DC | PRN
  Administered 2023-10-31: 4 mg via INTRAVENOUS

## 2023-10-31 MED ORDER — SODIUM CHLORIDE 0.9% FLUSH: 3.0000 mL | INTRAVENOUS | Status: DC | PRN

## 2023-10-31 MED ORDER — PHENYLEPHRINE 80 MCG/ML (10ML) SYRINGE FOR IV PUSH (FOR BLOOD PRESSURE SUPPORT)
PREFILLED_SYRINGE | INTRAVENOUS | Status: DC | PRN
  Administered 2023-10-31 (×2): 160 ug via INTRAVENOUS

## 2023-10-31 MED ORDER — APIXABAN 5 MG PO TABS
5.0000 mg | ORAL_TABLET | Freq: Two times a day (BID) | ORAL | Status: DC
Start: 2023-10-31 — End: 2023-11-01
  Administered 2023-10-31 – 2023-11-01 (×2): 5 mg via ORAL
  Filled 2023-10-31 (×2): qty 1

## 2023-10-31 MED ORDER — PHENYLEPHRINE HCL (PRESSORS) 10 MG/ML IV SOLN
INTRAVENOUS | Status: DC | PRN
  Administered 2023-10-31: 120 ug via INTRAVENOUS

## 2023-10-31 MED ORDER — PHENYLEPHRINE HCL-NACL 20-0.9 MG/250ML-% IV SOLN
INTRAVENOUS | Status: DC | PRN
  Administered 2023-10-31: 30 ug/min via INTRAVENOUS

## 2023-10-31 MED ORDER — HEPARIN (PORCINE) IN NACL 1000-0.9 UT/500ML-% IV SOLN
INTRAVENOUS | Status: DC | PRN
  Administered 2023-10-31 (×3): 500 mL

## 2023-10-31 MED ORDER — EPHEDRINE SULFATE-NACL 50-0.9 MG/10ML-% IV SOSY
PREFILLED_SYRINGE | INTRAVENOUS | Status: DC | PRN
  Administered 2023-10-31: 5 mg via INTRAVENOUS
  Administered 2023-10-31: 7.5 mg via INTRAVENOUS

## 2023-10-31 MED ORDER — ALBUMIN HUMAN 5 % IV SOLN
INTRAVENOUS | Status: AC
  Filled 2023-10-31: qty 250

## 2023-10-31 MED ORDER — DEXAMETHASONE SODIUM PHOSPHATE 10 MG/ML IJ SOLN
INTRAMUSCULAR | Status: DC | PRN
  Administered 2023-10-31: 8 mg via INTRAVENOUS

## 2023-10-31 MED ORDER — SODIUM CHLORIDE 0.9% FLUSH
3.0000 mL | Freq: Two times a day (BID) | INTRAVENOUS | Status: DC
  Administered 2023-10-31 – 2023-11-01 (×2): 3 mL via INTRAVENOUS

## 2023-10-31 MED ORDER — PROTAMINE SULFATE 10 MG/ML IV SOLN
INTRAVENOUS | Status: DC | PRN
  Administered 2023-10-31: 50 mg via INTRAVENOUS

## 2023-10-31 MED ORDER — SUGAMMADEX SODIUM 200 MG/2ML IV SOLN
INTRAVENOUS | Status: DC | PRN
  Administered 2023-10-31: 200 mg via INTRAVENOUS

## 2023-10-31 MED ORDER — FENTANYL CITRATE (PF) 250 MCG/5ML IJ SOLN
INTRAMUSCULAR | Status: DC | PRN
  Administered 2023-10-31 (×2): 50 ug via INTRAVENOUS

## 2023-10-31 MED ORDER — FENTANYL CITRATE (PF) 100 MCG/2ML IJ SOLN
INTRAMUSCULAR | Status: AC
  Filled 2023-10-31: qty 2

## 2023-10-31 MED ORDER — ALLOPURINOL 100 MG PO TABS
100.0000 mg | ORAL_TABLET | Freq: Two times a day (BID) | ORAL | Status: DC
  Administered 2023-10-31 – 2023-11-01 (×2): 100 mg via ORAL
  Filled 2023-10-31 (×2): qty 1

## 2023-10-31 MED ORDER — FENTANYL CITRATE (PF) 100 MCG/2ML IJ SOLN: 25.0000 ug | INTRAMUSCULAR | Status: DC | PRN

## 2023-10-31 MED ORDER — ONDANSETRON HCL 4 MG/2ML IJ SOLN: 4.0000 mg | Freq: Four times a day (QID) | INTRAMUSCULAR | Status: DC | PRN

## 2023-10-31 MED ORDER — LIDOCAINE 2% (20 MG/ML) 5 ML SYRINGE
INTRAMUSCULAR | Status: DC | PRN
  Administered 2023-10-31: 60 mg via INTRAVENOUS

## 2023-10-31 MED ORDER — ATORVASTATIN CALCIUM 10 MG PO TABS
20.0000 mg | ORAL_TABLET | Freq: Every day | ORAL | Status: DC
  Administered 2023-10-31: 20 mg via ORAL
  Filled 2023-10-31: qty 2

## 2023-10-31 MED ORDER — PROPOFOL 10 MG/ML IV BOLUS
INTRAVENOUS | Status: DC | PRN
  Administered 2023-10-31: 150 mg via INTRAVENOUS

## 2023-10-31 MED ORDER — ACETAMINOPHEN 325 MG PO TABS
650.0000 mg | ORAL_TABLET | ORAL | Status: DC | PRN
Start: 2023-10-31 — End: 2023-11-01

## 2023-10-31 MED ORDER — SODIUM CHLORIDE 0.9 % IV SOLN: Freq: Once | INTRAVENOUS | Status: AC

## 2023-10-31 MED ORDER — ALBUMIN HUMAN 5 % IV SOLN
12.5000 g | Freq: Once | INTRAVENOUS | Status: AC
Start: 2023-10-31 — End: 2023-10-31

## 2023-10-31 MED ORDER — HEPARIN SODIUM (PORCINE) 1000 UNIT/ML IJ SOLN
INTRAMUSCULAR | Status: DC | PRN
  Administered 2023-10-31: 3000 [IU] via INTRAVENOUS
  Administered 2023-10-31: 14000 [IU] via INTRAVENOUS

## 2023-10-31 MED ORDER — SODIUM CHLORIDE 0.9 % IV SOLN: INTRAVENOUS | Status: DC

## 2023-10-31 MED ORDER — ALBUMIN HUMAN 5 % IV SOLN: INTRAVENOUS | Status: DC | PRN

## 2023-10-31 MED ORDER — ATROPINE SULFATE 1 MG/ML IV SOLN
INTRAVENOUS | Status: DC | PRN
  Administered 2023-10-31: 1 mg via INTRAVENOUS

## 2023-10-31 MED ORDER — PANTOPRAZOLE SODIUM 40 MG PO TBEC
40.0000 mg | DELAYED_RELEASE_TABLET | Freq: Every evening | ORAL | Status: DC
  Administered 2023-10-31: 40 mg via ORAL
  Filled 2023-10-31: qty 1

## 2023-10-31 MED ORDER — ROCURONIUM BROMIDE 10 MG/ML (PF) SYRINGE
PREFILLED_SYRINGE | INTRAVENOUS | Status: DC | PRN
  Administered 2023-10-31: 20 mg via INTRAVENOUS
  Administered 2023-10-31: 50 mg via INTRAVENOUS
  Administered 2023-10-31: 10 mg via INTRAVENOUS

## 2023-10-31 SURGICAL SUPPLY — 21 items
BAG SNAP BAND KOVER 36X36 (MISCELLANEOUS) IMPLANT
BLANKET WARM UNDERBOD FULL ACC (MISCELLANEOUS) ×1 IMPLANT
CABLE PFA RX CATH CONN (CABLE) IMPLANT
CATH FARAWAVE ABLATION 31 (CATHETERS) IMPLANT
CATH OCTARAY 2.0 F 3-3-3-3-3 (CATHETERS) IMPLANT
CATH SOUNDSTAR ECO 8FR (CATHETERS) IMPLANT
CATH WEBSTER BI DIR CS D-F CRV (CATHETERS) IMPLANT
CLOSURE PERCLOSE PROSTYLE (VASCULAR PRODUCTS) IMPLANT
COVER SWIFTLINK CONNECTOR (BAG) ×1 IMPLANT
DEVICE CLOSURE MYNXGRIP 6/7F (Vascular Products) IMPLANT
DILATOR VESSEL 38 20CM 16FR (INTRODUCER) IMPLANT
GUIDEWIRE INQWIRE 1.5J.035X260 (WIRE) IMPLANT
INQWIRE 1.5J .035X260CM (WIRE) ×1
KIT VERSACROSS CNCT FARADRIVE (KITS) IMPLANT
PACK EP LF (CUSTOM PROCEDURE TRAY) ×1 IMPLANT
PAD DEFIB RADIO PHYSIO CONN (PAD) ×1 IMPLANT
PATCH CARTO3 (PAD) IMPLANT
SHEATH FARADRIVE STEERABLE (SHEATH) IMPLANT
SHEATH PINNACLE 8F 10CM (SHEATH) IMPLANT
SHEATH PINNACLE 9F 10CM (SHEATH) IMPLANT
SHEATH PROBE COVER 6X72 (BAG) IMPLANT

## 2023-10-31 NOTE — CV Procedure (Signed)
    TRANSESOPHAGEAL ECHOCARDIOGRAM   NAME:  James [name]   MRN: [id number] DOB:  1946-09-23   ADMIT DATE: 10/31/2023  INDICATIONS:  AF (pre-ablation)  PROCEDURE:   Informed consent was obtained prior to the procedure. The risks, benefits and alternatives for the procedure were discussed and the patient comprehended these risks.  Risks include, but are not limited to, cough, sore throat, vomiting, nausea, somnolence, esophageal and stomach trauma or perforation, bleeding, low blood pressure, aspiration, pneumonia, infection, trauma to the teeth and death.    The patient was on the EP table and under GA per the anesthesia service. The transesophageal probe was inserted in the esophagus and stomach without difficulty and multiple views were obtained.    COMPLICATIONS:    There were no immediate complications.  FINDINGS:  LEFT VENTRICLE: EF = 35-40%. No regional wall motion abnormalities.  RIGHT VENTRICLE: Mild HK  LEFT ATRIUM: Massively dilated  LEFT ATRIAL APPENDAGE: No thrombus.   RIGHT ATRIUM: Normal  AORTIC VALVE:  Trileaflet. Trivial AI  MITRAL VALVE:    NS/p MV ring. Trivial MR. No significant MS  TRICUSPID VALVE: Normal. Trivial TR  PULMONIC VALVE: Grossly normal.  INTERATRIAL SEPTUM: No PFO or ASD.  PERICARDIUM: No effusion  DESCENDING AORTA: Mild plaque    Nicky Kras,MD 11:53 AM

## 2023-10-31 NOTE — Progress Notes (Signed)
Patient wants to wait until morning b/c wife could not get him @2115 . Notified cardiology.  AC/pt placement notified.   D/c orders remain in place. Pt will leave first thing in morning

## 2023-10-31 NOTE — Transfer of Care (Signed)
Immediate Anesthesia Transfer of Care Note  Patient: James [name]  Procedure(s) Performed: ATRIAL FIBRILLATION ABLATION TRANSESOPHAGEAL ECHOCARDIOGRAM  Patient Location: PACU  Anesthesia Type:General  Level of Consciousness: awake, alert , and oriented  Airway & Oxygen Therapy: Patient Spontanous Breathing  Post-op Assessment: Report given to RN  Post vital signs: Reviewed  Last Vitals:  Vitals Value Taken Time  BP 84/51 10/31/23 1345  Temp 36.8 C 10/31/23 1342  Pulse 41 10/31/23 1346  Resp 16 10/31/23 1346  SpO2 92 % 10/31/23 1346  Vitals shown include unfiled device data.  Last Pain:  Vitals:   10/31/23 1342  TempSrc: Oral       Started albumin for hypotension  Complications: There were no known notable events for this encounter.

## 2023-10-31 NOTE — Anesthesia Procedure Notes (Signed)
Procedure Name: Intubation Date/Time: 10/31/2023 11:29 AM  Performed by: Randa Evens, CRNAPre-anesthesia Checklist: Patient identified, Emergency Drugs available, Suction available and Patient being monitored Patient Re-evaluated:Patient Re-evaluated prior to induction Oxygen Delivery Method: Circle System Utilized Preoxygenation: Pre-oxygenation with 100% oxygen Induction Type: IV induction Ventilation: Mask ventilation without difficulty Laryngoscope Size: Mac and 4 Grade View: Grade I Tube type: Oral Tube size: 7.0 mm Number of attempts: 1 Airway Equipment and Method: Stylet and Oral airway Placement Confirmation: ETT inserted through vocal cords under direct vision, positive ETCO2 and breath sounds checked- equal and bilateral Secured at: 21 cm Tube secured with: Tape Dental Injury: Teeth and Oropharynx as per pre-operative assessment

## 2023-10-31 NOTE — H&P (Signed)
Electrophysiology Office Note:    Date:  10/31/2023   ID:  James [name], DOB [birth year], MRN [id number]  PCP:  Emilio Aspen, MD   [facility] Providers Cardiologist:  Donato Schultz, MD Electrophysiologist:  Maurice Small, MD     Referring MD: No ref. provider found   History of Present Illness:    James [name] is a 78 y.o. male with a hx of Mitral valve repair in 2014significant for atrial fibrillation, referred for arrhythmia management.  He has been followed by Dr. Johney Frame in the past and underwent atrial flutter ablation in 2018.  He was noted by his PCP to have an irregular rhythm and referred back for management of atrial flutter.  He has been managed in atrial fibrillation clinic.  He underwent a cardioversion in January.  In follow-up in May 2024 he was noted to be in atrial flutter and underwent DC cardioversion subsequently.  He has not had any symptoms with these atrial fibrillation episodes.  He underwent DC cardioversions in October 2024 and again in December 2024.  He has had recurrence of atrial fibrillation within a short time.  He was given amiodarone prior to the most recent cardioversion.   I reviewed the patient's CT and labs. There was no LAA thrombus. he  has not missed any doses of anticoagulation, and he took his dose last night. There have been no changes in the patient's diagnoses, medications, or condition since our recent clinic visit.   Current Medications: Current Meds  Medication Sig   alfuzosin (UROXATRAL) 10 MG 24 hr tablet Take 10 mg by mouth daily with breakfast.   allopurinol (ZYLOPRIM) 100 MG tablet Take 100 mg by mouth 2 (two) times daily.   amiodarone (PACERONE) 200 MG tablet Take 1 tablet by mouth twice daily for 14 days, then decrease to 1 tablet ONCE daily (Patient taking differently: Take 200 mg by mouth daily.)   apixaban (ELIQUIS) 5 MG TABS tablet Take 5 mg by mouth 2 (two) times daily.   atorvastatin (LIPITOR) 20 MG  tablet Take 20 mg by mouth at bedtime.   Cholecalciferol (VITAMIN D3 PO) Take 1 tablet by mouth 3 (three) times a week.   Coenzyme Q10 (CO Q-10) 100 MG CAPS Take 100 mg by mouth daily.   ELDERBERRY PO Take 1 capsule by mouth once a week.   fish oil-omega-3 fatty acids 1000 MG capsule Take 1 g by mouth daily.    hydrocortisone 2.5 % cream Apply 1 Application topically 2 (two) times daily as needed (rash).   MAGNESIUM PO Take 1 tablet by mouth once a week.   metoprolol succinate (TOPROL-XL) 100 MG 24 hr tablet Take 100 mg by mouth daily. Take with or immediately following a meal.   Multiple Vitamin (MULTIVITAMIN WITH MINERALS) TABS Take 1 tablet by mouth daily. Centrum Silver   omeprazole (PRILOSEC OTC) 20 MG tablet Take 20 mg every evening by mouth.    sildenafil (REVATIO) 20 MG tablet Take 40-60 mg by mouth daily as needed (ED).   telmisartan (MICARDIS) 40 MG tablet Take 40 mg by mouth daily.   zinc gluconate 50 MG tablet Take 50 mg by mouth once a week.      EKGs/Labs/Other Studies Reviewed Today:    Echocardiogram:  TTE 02/18/2023 LVEF 60-65%, moderate LVH. Severely dilated left atrium. S/p MVR with mild mitral stenosis   Monitors:   Stress testing:   Advanced imaging:   Cardiac catherization   EKG:  Last EKG results: today -  sinus rhythm with first degree AV block    Physical Exam:    VS:  BP 132/88   Pulse (!) 106   Temp 98.4 F (36.9 C) (Oral)   Resp 18   Ht 5\' 11"  (1.803 m)   Wt 83.9 kg   SpO2 96%   BMI 25.80 kg/m     Wt Readings from Last 3 Encounters:  10/31/23 83.9 kg  10/03/23 83.9 kg  09/15/23 88.5 kg     GEN: Well nourished, well developed in no acute distress CARDIAC: RRR, no murmurs, rubs, gallops RESPIRATORY:  Normal work of breathing MUSCULOSKELETAL: no edema    ASSESSMENT & PLAN:    Persistent atrial fibrillation Mild, if any symptoms Now with recurrence of persistent A-fib and early recurrence after cardioversion Continue  apixaban for now due to ease of periprocedural management; I think he will need to switch to warfarin given history of mitral stenosis.  We discussed options for management.  After discussion, he would like to proceed with repeat atrial fibrillation ablation. Will resume amiodarone and schedule cardioversion as a bridge to ablation. He will need to hold metoprolol for 48 hours prior to cardioversion due to his history of sinus bradycardia.  We discussed the indication, rationale, logistics, anticipated benefits, and potential risks of the ablation procedure including but not limited to -- bleed at the groin access site, chest pain, damage to nearby organs, need for a drainage tube, or prolonged hospitalization. I explained that the risk for stroke, heart attack, need for open chest surgery, or even death is very low but not zero. he  expressed understanding and wishes to proceed.   Secondary hypercoagulable state Continue apixaban 5 mg  S/P mitral valve repair for mitral stenosis Mild MS      Signed, Maurice Small, MD  10/31/2023 10:49 AM    [facility]

## 2023-10-31 NOTE — Progress Notes (Signed)
Notified by NT that patient is bleeding from his right groin puncture site. Manual pressure held x15 mins. Site is soft, dry and intact. Patient denies pain. Vitals signs stable in flowsheet. Vascular assessment unchanged - in flowsheet. Michaell Cowing. PA-C notified. Plans to come assess site.

## 2023-11-01 DIAGNOSIS — I10 Essential (primary) hypertension: Secondary | ICD-10-CM | POA: Diagnosis not present

## 2023-11-01 DIAGNOSIS — E785 Hyperlipidemia, unspecified: Secondary | ICD-10-CM | POA: Diagnosis not present

## 2023-11-01 DIAGNOSIS — R001 Bradycardia, unspecified: Secondary | ICD-10-CM | POA: Diagnosis not present

## 2023-11-01 DIAGNOSIS — I4819 Other persistent atrial fibrillation: Secondary | ICD-10-CM | POA: Diagnosis not present

## 2023-11-01 DIAGNOSIS — K219 Gastro-esophageal reflux disease without esophagitis: Secondary | ICD-10-CM | POA: Diagnosis not present

## 2023-11-01 NOTE — Discharge Summary (Signed)
Discharge Summary    Patient ID: James [name] MRN: [id number]; DOB: 02/06/1946  Admit date: 10/31/2023 Discharge date: 11/01/2023  PCP:  Emilio Aspen, MD   [facility] Providers Cardiologist:  Donato Schultz, MD  Electrophysiologist:  Maurice Small, MD       Discharge Diagnoses    Principal Problem:   Bradycardia Active Problems:   Hypertension   GERD (gastroesophageal reflux disease)   Hyperlipidemia   S/P mitral valve repair   Atrial flutter (HCC)   Hypercoagulable state due to persistent atrial fibrillation [facility])    Diagnostic Studies/Procedures    Afib ablation and TEE 10/31/23: 1. Atrial fibrillation upon presentation.   2. Successful ablation of all four pulmonary veins with pulsed field energy. 3. Successful ablation of the posterior wall of the left atrium with pulsed field energy 4. No inducible arrhythmias following ablation  5. Junctional rhythm following DCCV 6. No early apparent complications. _____________  Echo 01/2023:  1. Left ventricular ejection fraction, by estimation, is 60 to 65%. The  left ventricle has normal function. The left ventricle has no regional  wall motion abnormalities. There is moderate left ventricular hypertrophy.  Left ventricular diastolic  parameters are indeterminate.   2. Right ventricular systolic function is normal. The right ventricular  size is normal. There is normal pulmonary artery systolic pressure. The  estimated right ventricular systolic pressure is 22.9 mmHg.   3. Left atrial size was severely dilated.   4. Right atrial size was moderately dilated.   5. The mitral valve has been repaired/replaced. No evidence of mitral  valve regurgitation. Mild mitral stenosis. The mean mitral valve gradient  is 4.0 mmHg. There is a 32 mm prosthetic annuloplasty ring present in the  mitral position. Procedure Date:  01/06/2013. Echo findings are consistent with normal structure and function  of the mitral  valve prosthesis.   6. The aortic valve is normal in structure. Aortic valve regurgitation is  trivial. No aortic stenosis is present.   7. Aortic dilatation noted. There is mild dilatation of the aortic root,  measuring 43 mm.   8. The inferior vena cava is normal in size with greater than 50%  respiratory variability, suggesting right atrial pressure of 3 mmHg.    History of Present Illness     James [name] is a 78 y.o. male with  a hx of Mitral valve repair in 2014significant for atrial fibrillation, referred for arrhythmia management.   He has been followed by Dr. Johney Frame in the past and underwent atrial flutter ablation in 2018.  He was noted by his PCP to have an irregular rhythm and referred back for management of atrial flutter.  He has been managed in atrial fibrillation clinic.  He underwent a cardioversion in January.  In follow-up in May 2024 he was noted to be in atrial flutter and underwent DC cardioversion subsequently.   He has not had any symptoms with these atrial fibrillation episodes.   He underwent DC cardioversions in October 2024 and again in December 2024.  He has had recurrence of atrial fibrillation within a short time.  He was given amiodarone prior to the most recent cardioversion.    I reviewed the patient's CT and labs. There was no LAA thrombus. he  has not missed any doses of anticoagulation, and he took his dose last night. There have been no changes in the patient's diagnoses, medications, or condition since our recent clinic visit.  Hospital Course     Consultants:  none  Persistent atrial fibrillation She presented for schedule TEE and ablation. She had successful ablation of all 4 pulmonary veins. She had junctional rhythm after ablation. She was kept overnight to monitor sinus node conduction and recovery. Today, she was conducting well on telemetry and deemed stable for discharge. - continue amiodarone, eliquis - continue monitoring on apple  watch   Junctional bradycardia Stop metoprolol   Patient seen and examined by Dr. Jimmey Ralph and deemed stable for discharge.  Follow-up has been arranged.      Did the patient have an acute coronary syndrome (MI, NSTEMI, STEMI, etc) this admission?:  No                               Did the patient have a percutaneous coronary intervention (stent / angioplasty)?:  No.        The patient will be scheduled for a TOC follow up appointment in 7-14 days.  A message has been sent to the [facility] and Scheduling Pool at the office where the patient should be seen for follow up.  _____________  Discharge Vitals Blood pressure (!) 101/57, pulse (!) 53, temperature 98.3 F (36.8 C), temperature source Oral, resp. rate 20, height 5\' 11"  (1.803 m), weight 83.9 kg, SpO2 95%.  Filed Weights   10/31/23 0918  Weight: 83.9 kg    Labs & Radiologic Studies    CBC No results for input(s): "WBC", "NEUTROABS", "HGB", "HCT", "MCV", "PLT" in the last 72 hours. Basic Metabolic Panel No results for input(s): "NA", "K", "CL", "CO2", "GLUCOSE", "BUN", "CREATININE", "CALCIUM", "MG", "PHOS" in the last 72 hours. Liver Function Tests No results for input(s): "AST", "ALT", "ALKPHOS", "BILITOT", "PROT", "ALBUMIN" in the last 72 hours. No results for input(s): "LIPASE", "AMYLASE" in the last 72 hours. High Sensitivity Troponin:   No results for input(s): "TROPONINIHS" in the last 720 hours.  BNP Invalid input(s): "POCBNP" D-Dimer No results for input(s): "DDIMER" in the last 72 hours. Hemoglobin A1C No results for input(s): "HGBA1C" in the last 72 hours. Fasting Lipid Panel No results for input(s): "CHOL", "HDL", "LDLCALC", "TRIG", "CHOLHDL", "LDLDIRECT" in the last 72 hours. Thyroid Function Tests No results for input(s): "TSH", "T4TOTAL", "T3FREE", "THYROIDAB" in the last 72 hours.  Invalid input(s): "FREET3" _____________  ECHO TEE Result Date: 10/31/2023    TRANSESOPHOGEAL ECHO REPORT   Patient  Name:   James [name] Date of Exam: 10/31/2023 Medical Rec #:  [id number]     Height:       71.0 in Accession #:    [id number]    Weight:       185.0 lb Date of Birth:  Nov 18, 1945    BSA:          2.040 m Patient Age:    77 years      BP:           132/88 mmHg Patient Gender: M             HR:           106 bpm. Exam Location:  Inpatient Procedure: Transesophageal Echo, Color Doppler and Cardiac Doppler Indications:     I48.91* Unspecified atrial fibrillation  History:         Patient has prior history of Echocardiogram examinations, most                  recent 02/18/2023. Risk Factors:Hypertension and Dyslipidemia.  Mitral Valve: 34 Mitral Memo 3D prosthetic annuloplasty ring                  valve is present in the mitral position. Procedure Date:                  01/06/13.  Sonographer:     Irving Burton Senior RDCS Referring Phys:  [id number] Maurice Small Diagnosing Phys: Arvilla Meres MD  Sonographer Comments: On table prior to ablation PROCEDURE: After discussion of the risks and benefits of a TEE, an informed consent was obtained from the patient. The transesophogeal probe was passed without difficulty through the esophogus of the patient. Sedation performed by different physician. The patient was monitored while under deep sedation. The patient developed no complications during the procedure.  IMPRESSIONS  1. Left ventricular ejection fraction, by estimation, is 40 to 45%. The left ventricle has mildly decreased function.  2. Right ventricular systolic function is mildly reduced. The right ventricular size is normal.  3. Left atrial size was severely dilated. No left atrial/left atrial appendage thrombus was detected.  4. The mitral valve has been repaired/replaced. Trivial mitral valve regurgitation. There is a 34 Mitral Memo 3D prosthetic annuloplasty ring present in the mitral position. Procedure Date: 01/06/13.  5. The aortic valve is tricuspid. Aortic valve regurgitation is trivial. FINDINGS   Left Ventricle: Left ventricular ejection fraction, by estimation, is 40 to 45%. The left ventricle has mildly decreased function. The left ventricular internal cavity size was normal in size. Right Ventricle: The right ventricular size is normal. No increase in right ventricular wall thickness. Right ventricular systolic function is mildly reduced. Left Atrium: Left atrial size was severely dilated. No left atrial/left atrial appendage thrombus was detected. Right Atrium: Right atrial size was normal in size. Pericardium: There is no evidence of pericardial effusion. Mitral Valve: The mitral valve has been repaired/replaced. Trivial mitral valve regurgitation. There is a 34 Mitral Memo 3D prosthetic annuloplasty ring present in the mitral position. Procedure Date: 01/06/13. MV peak gradient, 4.8 mmHg. The mean mitral valve gradient is 2.0 mmHg. Tricuspid Valve: The tricuspid valve is normal in structure. Tricuspid valve regurgitation is trivial. Aortic Valve: The aortic valve is tricuspid. Aortic valve regurgitation is trivial. Pulmonic Valve: The pulmonic valve was normal in structure. Pulmonic valve regurgitation is trivial. Aorta: The aortic root is normal in size and structure. IAS/Shunts: No atrial level shunt detected by color flow Doppler. Additional Comments: Spectral Doppler performed. MITRAL VALVE MV Peak grad: 4.8 mmHg MV Mean grad: 2.0 mmHg MV Vmax:      1.10 m/s MV Vmean:     70.1 cm/s Arvilla Meres MD Electronically signed by Arvilla Meres MD Signature Date/Time: 10/31/2023/4:45:41 PM    Final    EP STUDY Result Date: 10/31/2023 SURGEON:  Maurice Small, MD PREPROCEDURE DIAGNOSES: 1. Persistent atrial fibrillation. POSTPROCEDURE DIAGNOSES: 1. Persistent  atrial fibrillation. PROCEDURES: 1. Eelectrophysiologic study. 2. Coronary sinus pacing and recording. 3. Ablation of the pulmonary veins with pulsed field ablation 4. Ablation of the posterior wall of the left atrium with pulsed field  ablation 5. Intracardiac echocardiography. 6. Transseptal puncture of an intact septum. INTRODUCTION:  James [name] is a 78 y.o. male with a history of persistent atrial fibrillation who now presents for EP study and radiofrequency ablation . DESCRIPTION OF PROCEDURE:  Informed written consent was obtained, and the patient was brought to the electrophysiology lab in a fasting state.  The patient was adequately sedated with intravenous medications as  outlined in the anesthesia report.  The patient's left and right groins were prepped and draped in the usual sterile fashion by the EP lab staff.  Using a percutaneous Seldinger technique, 1 9-French hemostasis sheaths was placed in the right femoral vein, and one 9-French and one 8-French  hemostasis sheath were placed into the left common femoral vein. Direct ultrasound guidance was used for right and left femoral veins with normal vessel patency. Using ultrasound guidance, the Brockenbrough needle and wire were visualized entering the vessel.  Initial Measurements: The patient presented to the electrophysiology lab in atrial fibrillation. his QRS duration was 111 msec and a QT interval of 355 msec.  Intracardiac Echocardiography: An 8-French Biosense Webster AcuNav intracardiac echocardiography catheter was introduced through the left common femoral vein and advanced into the right atrium. Intracardiac echocardiography was performed of the left atrium, and a three-dimensional anatomical rendering of the left atrium was performed using CARTO sound technology. The patient was noted to have a very enlarged left atrium. The interatrial septum appeared normal. All 4 pulmonary veins were visualized and noted to have separate ostia.  The pulmonary veins were normal in size.  The left atrial appendage was visualized and did not reveal thrombus.   There was no evidence of pulmonary vein stenosis. Transseptal Puncture: Intravenous heparin was administered to achieve a goal ACT  of 300-350 seconds. The right common femoral vein sheath was exchanged for Faradrive connect sheath and transseptal access was achieved in a standard fashion using a Bayless pigtail wire with intracardiac echocardiography.  Catheter Placement:  A 7-French Biosense Webster Decapolar coronary sinus catheter was introduced through the right common femoral vein and advanced into the coronary sinus for recording and pacing from this location. 3D Mapping and Ablation: A  Biosense Webster Octarray catheter was advanced into the left atrium through the Faradrive sheath. A 3 D electroanatomical map was obtained. This revealed decreased voltage across the posterior wall while mapping in atrial fibrillation.  The mapping catheter was then removed and the Farapulse catheter advanced to the left atrium over its wire. Sequential electrical ablation of all four pulmonary veins was then performed using pulsed field energy. The posterior wall of the left atrium was also ablated.  A total of 64 pulses were delivered.  Cardioversion: The patient was then cardioverted to sinus rhythm with a single synchronized 200-J biphasic shock with cardioversion electrodes in the anterior-posterior thoracic configuration. He converted to a junctional rhythm and did not return to sinus until after catheters were pulled.. Measurements Following Ablation: After cardioversion, in junctional rhythm, the  RR interval was 1512 msec, with QRS 99 msec, and QT 520 msec. AVWCL was 550 msec. The procedure was therefore considered completed.  All catheters were removed, and the sheaths were aspirated and flushed. The right femoral venous access was closed with a two perclose devices and the left femoral venous accesses with Mynx devices.  EBL<80ml.  Intracardiac echocardiogram revealed no pericardial effusion.  There were no early apparent complications. CONCLUSIONS: 1. Atrial fibrillation upon presentation.  2. Successful ablation of all four pulmonary veins  with pulsed field energy. 3. Successful ablation of the posterior wall of the left atrium with pulsed field energy 4. No inducible arrhythmias following ablation 5. Junctional rhythm following DCCV 6. No early apparent complications. Roberts Gaudy Mealor,MD 1:33 PM 10/31/2023   EP STUDY Result Date: 10/03/2023 See surgical note for result.  Disposition   Pt is being discharged home today in good condition.  Follow-up Plans & Appointments  Discharge Instructions     Diet - low sodium heart healthy   Complete by: As directed    Discharge instructions   Complete by: As directed    No driving for 2 days. No lifting over 5 lbs for 1 week. No sexual activity for 1 week. Keep procedure site clean & dry. If you notice increased pain, swelling, bleeding or pus, call/return!  You may shower, but no soaking baths/hot tubs/pools for 1 week.   Increase activity slowly   Complete by: As directed         Discharge Medications   Allergies as of 11/01/2023       Reactions   Iodinated Contrast Media Itching, Swelling   Rash, itching back approx 6 hours after scan Facial sewlling approx 10 hours after scan MD morning after scan gave steroid shot - cleared after 2 hours   Iodine Itching, Swelling, Other (See Comments)   Dye from a CT scan.  Had reaction. Lips and face swelling        Medication List     STOP taking these medications    metoprolol succinate 100 MG 24 hr tablet Commonly known as: TOPROL-XL       TAKE these medications    alfuzosin 10 MG 24 hr tablet Commonly known as: UROXATRAL Take 10 mg by mouth daily with breakfast.   allopurinol 100 MG tablet Commonly known as: ZYLOPRIM Take 100 mg by mouth 2 (two) times daily.   amiodarone 200 MG tablet Commonly known as: PACERONE Take 1 tablet by mouth twice daily for 14 days, then decrease to 1 tablet ONCE daily What changed:  how much to take how to take this when to take this additional instructions   apixaban  5 MG Tabs tablet Commonly known as: ELIQUIS Take 5 mg by mouth 2 (two) times daily.   atorvastatin 20 MG tablet Commonly known as: LIPITOR Take 20 mg by mouth at bedtime.   Co Q-10 100 MG Caps Take 100 mg by mouth daily.   ELDERBERRY PO Take 1 capsule by mouth once a week.   fish oil-omega-3 fatty acids 1000 MG capsule Take 1 g by mouth daily.   fluticasone 50 MCG/ACT nasal spray Commonly known as: FLONASE Place 2 sprays into both nostrils daily as needed for allergies or rhinitis.   hydrocortisone 2.5 % cream Apply 1 Application topically 2 (two) times daily as needed (rash).   MAGNESIUM PO Take 1 tablet by mouth once a week.   multivitamin with minerals Tabs tablet Take 1 tablet by mouth daily. Centrum Silver   omeprazole 20 MG tablet Commonly known as: PRILOSEC OTC Take 20 mg every evening by mouth.   sildenafil 20 MG tablet Commonly known as: REVATIO Take 40-60 mg by mouth daily as needed (ED).   telmisartan 40 MG tablet Commonly known as: MICARDIS Take 40 mg by mouth daily.   VITAMIN D3 PO Take 1 tablet by mouth 3 (three) times a week.   zinc gluconate 50 MG tablet Take 50 mg by mouth once a week.           Outstanding Labs/Studies    Duration of Discharge Encounter: APP Time: 15 minutes   Signed, Marcelino Duster, PA 11/01/2023, 1:59 PM

## 2023-11-01 NOTE — Care Management Obs Status (Signed)
MEDICARE OBSERVATION STATUS NOTIFICATION   Patient Details  Name: James [name] MRN: [id number] Date of Birth: 21-Jul-1946   Medicare Observation Status Notification Given:  Yes    Ronny Bacon, RN 11/01/2023, 10:02 AM

## 2023-11-01 NOTE — Anesthesia Postprocedure Evaluation (Signed)
Anesthesia Post Note  Patient: Zein Helbing  Procedure(s) Performed: ATRIAL FIBRILLATION ABLATION TRANSESOPHAGEAL ECHOCARDIOGRAM     Patient location during evaluation: PACU Anesthesia Type: General Level of consciousness: awake and alert Pain management: pain level controlled Vital Signs Assessment: post-procedure vital signs reviewed and stable Respiratory status: spontaneous breathing, nonlabored ventilation, respiratory function stable and patient connected to nasal cannula oxygen Cardiovascular status: blood pressure returned to baseline and stable Postop Assessment: no apparent nausea or vomiting Anesthetic complications: no  There were no known notable events for this encounter.  Last Vitals:  Vitals:   10/31/23 2341 11/01/23 0320  BP: (!) 163/88 (!) 152/84  Pulse: 60 (!) 58  Resp: 20 20  Temp: 36.4 C 36.5 C  SpO2: 93% 95%    Last Pain:  Vitals:   11/01/23 0320  TempSrc: Oral  PainSc: 0-No pain   Pain Goal: Patients Stated Pain Goal: 0 (10/31/23 1353)                 Weldon Nouri L Angelette Ganus

## 2023-11-01 NOTE — Progress Notes (Addendum)
   Patient Name: James [name] Date of Encounter: 11/01/2023 [facility] Cardiologist: Donato Schultz, MD   Interval Summary  .    Patient had AF ablation yesterday with Dr. Nelly Laurence. Had junctional rhythm immediately after. Recovered sinus node conduction overnight. Feels well this morning. No new or acute complaints.   Vital Signs .    Vitals:   10/31/23 1957 10/31/23 2341 11/01/23 0320 11/01/23 0819  BP: 114/69 (!) 163/88 (!) 152/84 134/69  Pulse: (!) 50 60 (!) 58   Resp: 20 20 20 20   Temp: 97.6 F (36.4 C) 97.6 F (36.4 C) 97.7 F (36.5 C) 97.6 F (36.4 C)  TempSrc: Oral Oral Oral Oral  SpO2: 94% 93% 95%   Weight:      Height:        Intake/Output Summary (Last 24 hours) at 11/01/2023 0920 Last data filed at 11/01/2023 0418 Gross per 24 hour  Intake 1214.13 ml  Output 430 ml  Net 784.13 ml      10/31/2023    9:18 AM 10/03/2023    7:53 AM 09/15/2023    1:46 PM  Last 3 Weights  Weight (lbs) 185 lb 185 lb 195 lb 3.2 oz  Weight (kg) 83.915 kg 83.915 kg 88.542 kg      Telemetry/ECG    Now predominantly sinus rhythm - Personally Reviewed  Physical Exam .   GEN: No acute distress.   Neck: No JVD Cardiac: Normal rate, regular rhythm Respiratory: Clear to auscultation bilaterally. GI: Soft, non-distended  Groins: Bilaterally soft, nontender, no hematoma  MS: No edema  Assessment & Plan .    #. Sinus node dysfunction: Secondary to suppression of sinus node function in persistent atrial fibrillation. Junctional rhythm immediately following ablation. Sinus node function has since recovered overnight.  #. Junctional bradycardia - Stop metoprolol.  - Continue monitoring on telemetry throughout the course of the day. If remains in NSR then anticipate discharge home this evening. He will continue to monitor heart rates after discharge on Apple Watch. - 12 lead EKG ordered.   #. Persistent atrial fibrillation s/p ablation 1/31: - Continue amiodarone.  - Continue  Eliquis.  - Continue monitoring with Apple Watch. - Standard post ablation activity restrictions and wound care provided.   For questions or updates, please contact [facility] Please consult [email or web address] for contact info under        Signed, Nobie Putnam, MD

## 2023-11-03 ENCOUNTER — Encounter (HOSPITAL_COMMUNITY): Payer: Self-pay | Admitting: Cardiovascular Disease

## 2023-11-28 ENCOUNTER — Ambulatory Visit (HOSPITAL_COMMUNITY)
Admission: RE | Admit: 2023-11-28 | Discharge: 2023-11-28 | Disposition: A | Discharge status: Home / Self Care | Payer: Medicare Other | Source: Ambulatory Visit | Attending: Internal Medicine | Admitting: Internal Medicine

## 2023-11-28 VITALS — BP 120/74 | HR 103 | Ht 71.0 in | Wt 195.0 lb

## 2023-11-28 DIAGNOSIS — I4819 Other persistent atrial fibrillation: Secondary | ICD-10-CM | POA: Diagnosis present

## 2023-11-28 DIAGNOSIS — Z7901 Long term (current) use of anticoagulants: Secondary | ICD-10-CM | POA: Insufficient documentation

## 2023-11-28 DIAGNOSIS — I4892 Unspecified atrial flutter: Secondary | ICD-10-CM | POA: Diagnosis not present

## 2023-11-28 DIAGNOSIS — Z5181 Encounter for therapeutic drug level monitoring: Secondary | ICD-10-CM | POA: Diagnosis not present

## 2023-11-28 DIAGNOSIS — D6869 Other thrombophilia: Secondary | ICD-10-CM | POA: Diagnosis not present

## 2023-11-28 DIAGNOSIS — I10 Essential (primary) hypertension: Secondary | ICD-10-CM | POA: Insufficient documentation

## 2023-11-28 DIAGNOSIS — I340 Nonrheumatic mitral (valve) insufficiency: Secondary | ICD-10-CM | POA: Insufficient documentation

## 2023-11-28 DIAGNOSIS — Z79899 Other long term (current) drug therapy: Secondary | ICD-10-CM | POA: Insufficient documentation

## 2023-11-28 DIAGNOSIS — R0609 Other forms of dyspnea: Secondary | ICD-10-CM | POA: Diagnosis present

## 2023-11-28 MED ORDER — AMIODARONE HCL 200 MG PO TABS: ORAL_TABLET | ORAL | 3 refills | Status: DC

## 2023-11-28 NOTE — Patient Instructions (Signed)
 Increase amiodarone to 200mg  twice a day for 30 days then reduce to once a day
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
## 2023-11-29 IMAGING — DX DG FOOT 2V*L*
2 series · 2 of 2 positions shown · non-contrast
Comparison: None.

CLINICAL DATA: Left dorsal foot pain.

EXAM:
LEFT FOOT - 2 VIEW

[dg foot 2 views left (1 of 2)]
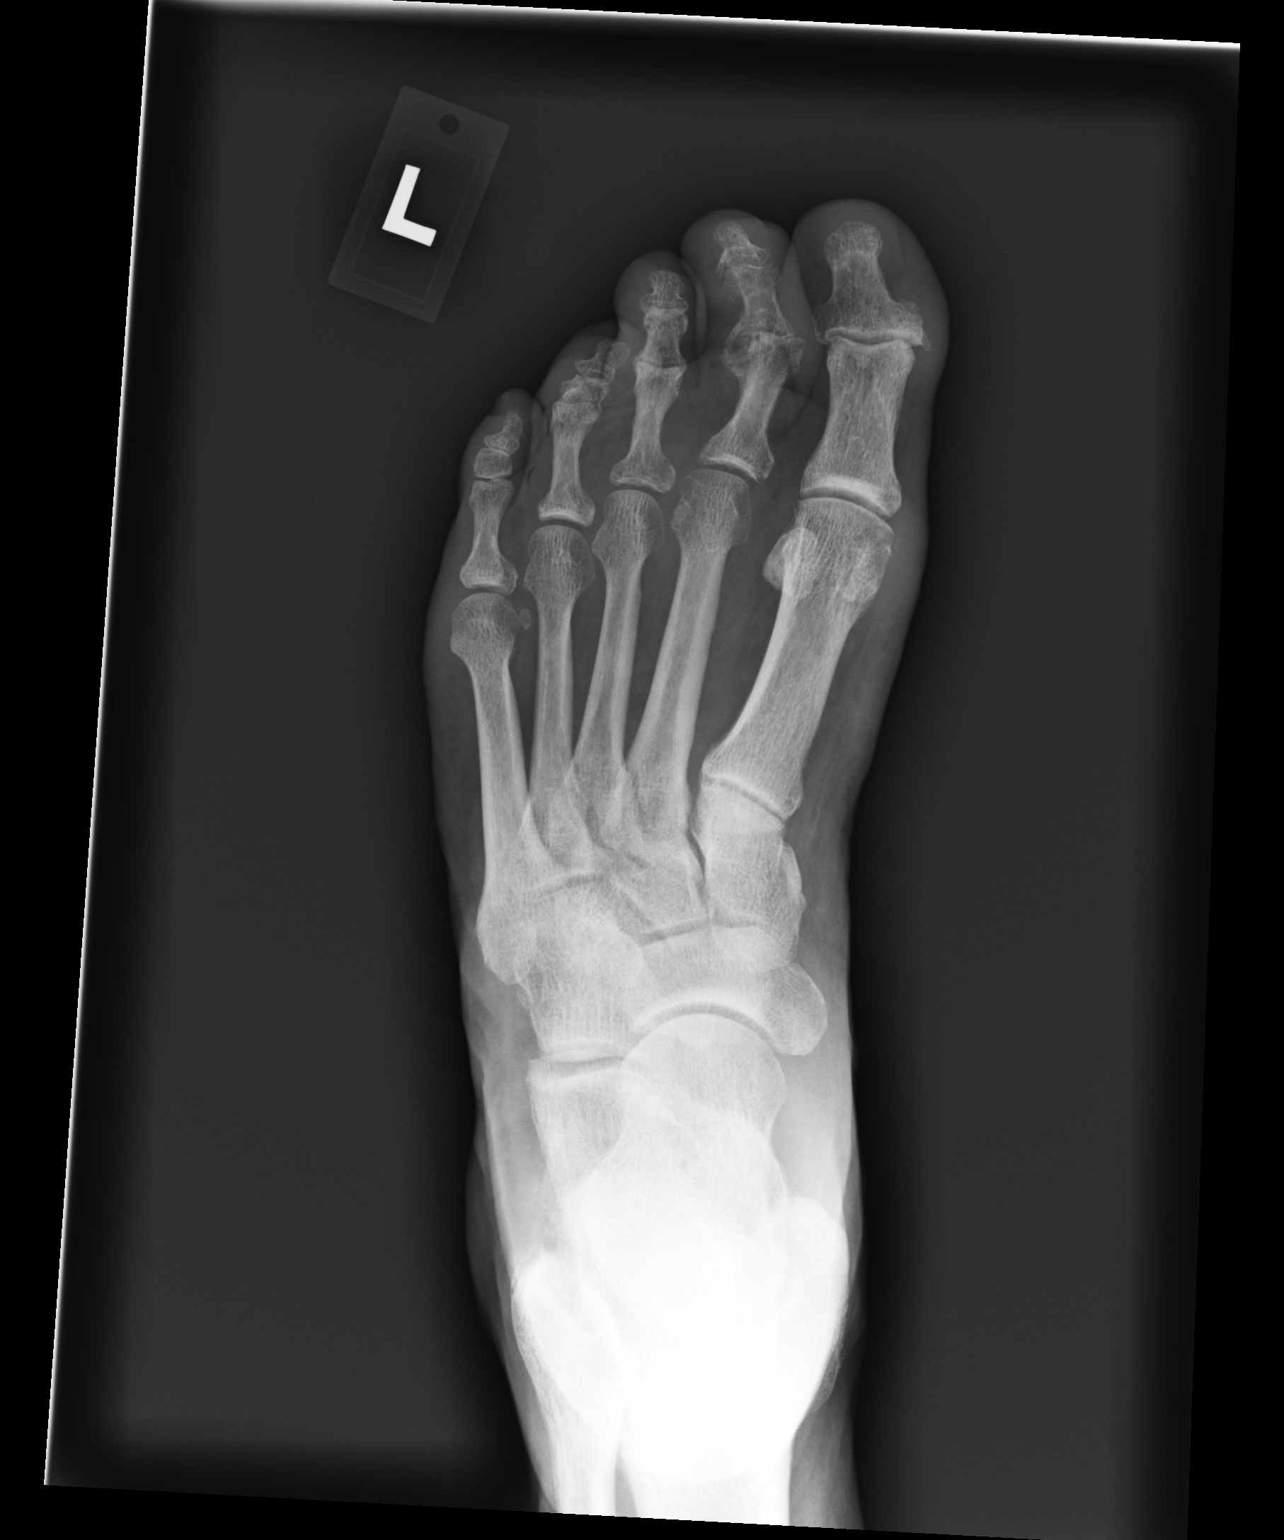

[dg foot 2 views left (2 of 2)]
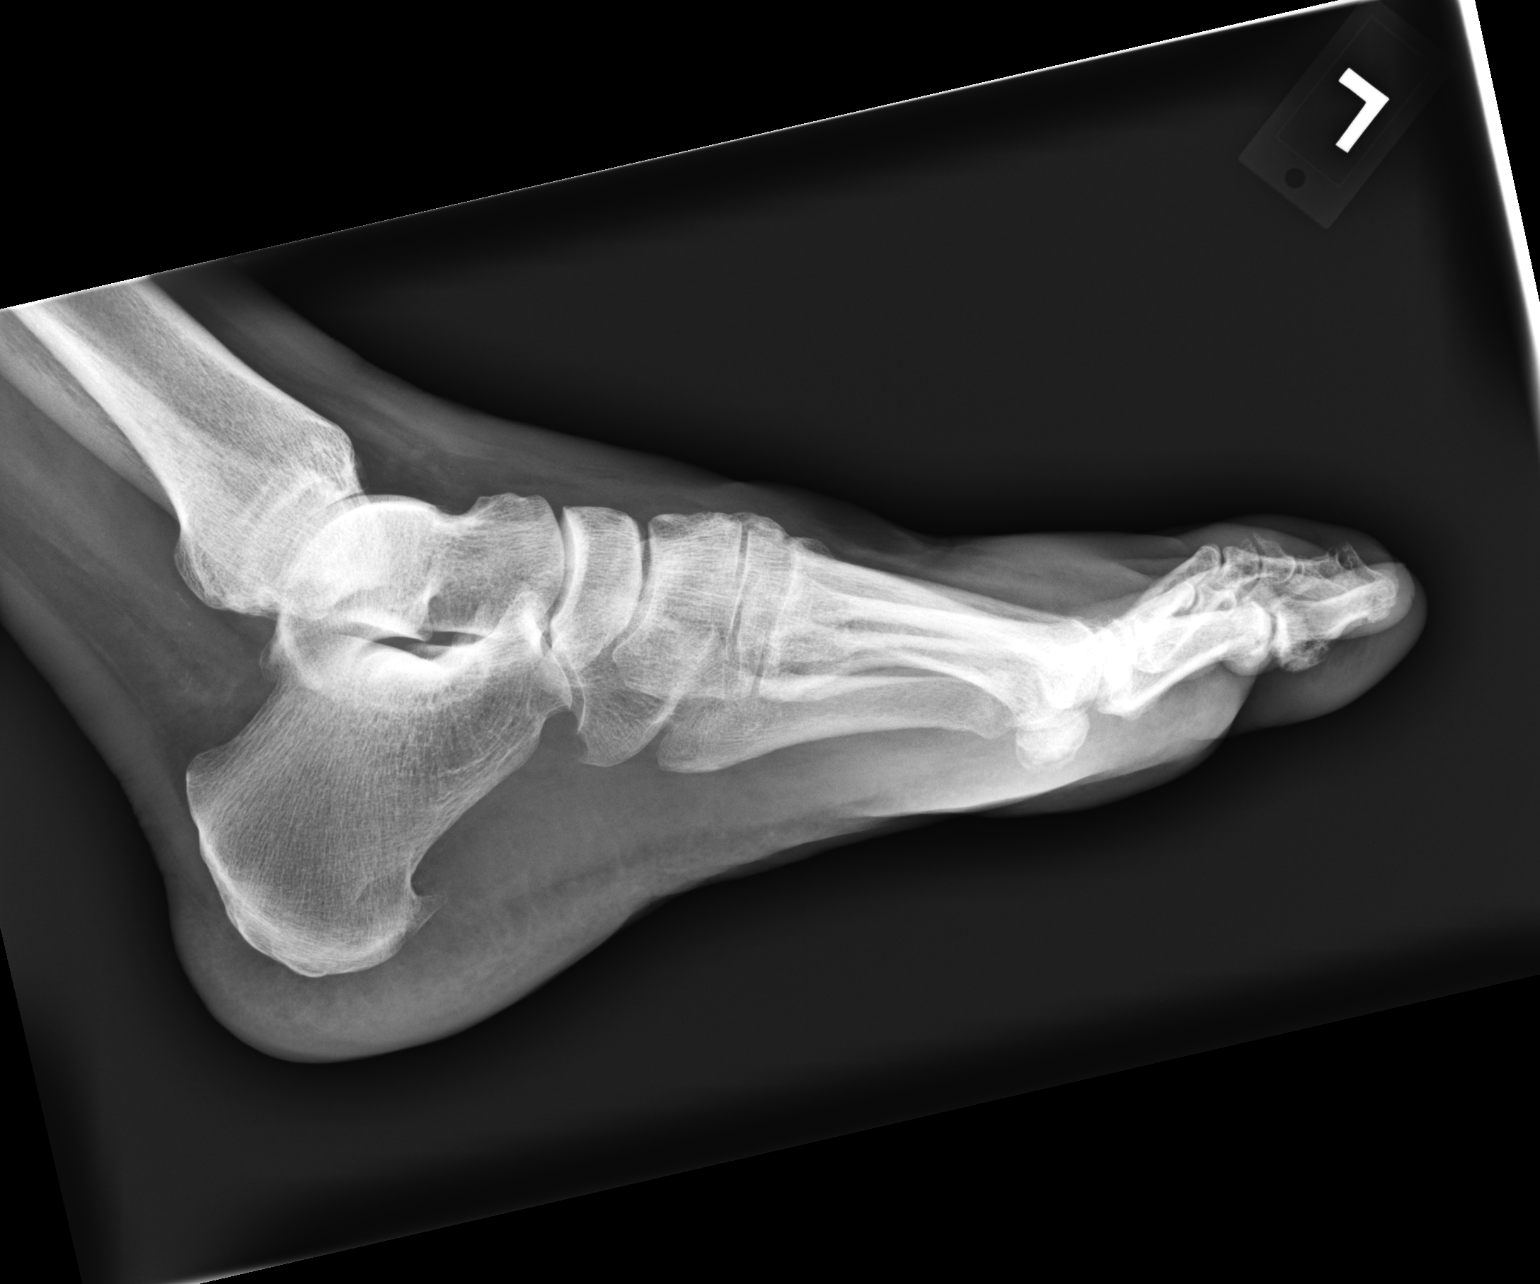

[2 of 2 positions shown; findings below may reference images not displayed]

FINDINGS: Slight hammertoe deformity of the digits. Otherwise normal
alignment. There is joint space narrowing of the second toe proximal
interphalangeal joint with splaying osteophytes, possible remote
prior injury. Osteophytes and degenerative change at the great toe
interphalangeal joint. Moderate plantar calcaneal spur. No erosion
or periostitis. Minimal dorsal soft tissue edema.
IMPRESSION: 1. Osteoarthritis of the second toe proximal interphalangeal joint,
there may also be sequela remote prior injury. Mild osteoarthritis
of the great toe interphalangeal joint
2. Moderate plantar calcaneal spur.
3. Mild dorsal soft tissue edema.

## 2023-12-05 DIAGNOSIS — I77810 Thoracic aortic ectasia: Secondary | ICD-10-CM | POA: Diagnosis not present

## 2023-12-05 DIAGNOSIS — K909 Intestinal malabsorption, unspecified: Secondary | ICD-10-CM | POA: Diagnosis not present

## 2023-12-05 DIAGNOSIS — I10 Essential (primary) hypertension: Secondary | ICD-10-CM | POA: Diagnosis not present

## 2023-12-05 DIAGNOSIS — Z0000 Encounter for general adult medical examination without abnormal findings: Secondary | ICD-10-CM | POA: Diagnosis not present

## 2023-12-05 DIAGNOSIS — M109 Gout, unspecified: Secondary | ICD-10-CM | POA: Diagnosis not present

## 2023-12-05 DIAGNOSIS — I483 Typical atrial flutter: Secondary | ICD-10-CM | POA: Diagnosis not present

## 2023-12-05 DIAGNOSIS — Z79899 Other long term (current) drug therapy: Secondary | ICD-10-CM | POA: Diagnosis not present

## 2023-12-05 DIAGNOSIS — E782 Mixed hyperlipidemia: Secondary | ICD-10-CM | POA: Diagnosis not present

## 2023-12-05 DIAGNOSIS — K219 Gastro-esophageal reflux disease without esophagitis: Secondary | ICD-10-CM | POA: Diagnosis not present

## 2023-12-05 DIAGNOSIS — Z9049 Acquired absence of other specified parts of digestive tract: Secondary | ICD-10-CM | POA: Diagnosis not present

## 2023-12-05 DIAGNOSIS — D6869 Other thrombophilia: Secondary | ICD-10-CM | POA: Diagnosis not present

## 2023-12-05 DIAGNOSIS — Z8719 Personal history of other diseases of the digestive system: Secondary | ICD-10-CM | POA: Diagnosis not present

## 2023-12-05 DIAGNOSIS — C4491 Basal cell carcinoma of skin, unspecified: Secondary | ICD-10-CM | POA: Diagnosis not present

## 2023-12-12 DIAGNOSIS — Z860100 Personal history of colon polyps, unspecified: Secondary | ICD-10-CM | POA: Diagnosis not present

## 2023-12-12 DIAGNOSIS — K219 Gastro-esophageal reflux disease without esophagitis: Secondary | ICD-10-CM | POA: Diagnosis not present

## 2023-12-16 ENCOUNTER — Ambulatory Visit (HOSPITAL_COMMUNITY)
Admission: RE | Admit: 2023-12-16 | Discharge: 2023-12-16 | Disposition: A | Discharge status: Home / Self Care | Payer: Medicare Other | Source: Ambulatory Visit | Attending: Internal Medicine | Admitting: Internal Medicine

## 2023-12-16 VITALS — BP 112/76 | HR 97 | Ht 71.0 in | Wt 197.2 lb

## 2023-12-16 DIAGNOSIS — I4819 Other persistent atrial fibrillation: Secondary | ICD-10-CM | POA: Diagnosis not present

## 2023-12-16 DIAGNOSIS — Z952 Presence of prosthetic heart valve: Secondary | ICD-10-CM | POA: Insufficient documentation

## 2023-12-16 DIAGNOSIS — I4892 Unspecified atrial flutter: Secondary | ICD-10-CM | POA: Diagnosis not present

## 2023-12-16 DIAGNOSIS — Z7901 Long term (current) use of anticoagulants: Secondary | ICD-10-CM | POA: Diagnosis not present

## 2023-12-16 DIAGNOSIS — I10 Essential (primary) hypertension: Secondary | ICD-10-CM | POA: Insufficient documentation

## 2023-12-16 DIAGNOSIS — Z79899 Other long term (current) drug therapy: Secondary | ICD-10-CM | POA: Insufficient documentation

## 2023-12-16 DIAGNOSIS — I4891 Unspecified atrial fibrillation: Secondary | ICD-10-CM | POA: Diagnosis not present

## 2023-12-16 DIAGNOSIS — Z5181 Encounter for therapeutic drug level monitoring: Secondary | ICD-10-CM | POA: Diagnosis not present

## 2023-12-16 DIAGNOSIS — D6869 Other thrombophilia: Secondary | ICD-10-CM | POA: Diagnosis not present

## 2023-12-16 LAB — CBC
HCT: 37.4 % — ABNORMAL LOW (ref 39.0–52.0)
Hemoglobin: 13 g/dL (ref 13.0–17.0)
MCH: 34.4 pg — ABNORMAL HIGH (ref 26.0–34.0)
MCHC: 34.8 g/dL (ref 30.0–36.0)
MCV: 98.9 fL (ref 80.0–100.0)
Platelets: 193 10*3/uL (ref 150–400)
RBC: 3.78 MIL/uL — ABNORMAL LOW (ref 4.22–5.81)
RDW: 12.7 % (ref 11.5–15.5)
WBC: 4.9 10*3/uL (ref 4.0–10.5)
nRBC: 0 % (ref 0.0–0.2)

## 2023-12-16 LAB — BASIC METABOLIC PANEL
Anion gap: 7 (ref 5–15)
BUN: 10 mg/dL (ref 8–23)
CO2: 23 mmol/L (ref 22–32)
Calcium: 8.9 mg/dL (ref 8.9–10.3)
Chloride: 105 mmol/L (ref 98–111)
Creatinine, Ser: 1.14 mg/dL (ref 0.61–1.24)
GFR, Estimated: >60 mL/min (ref >60)
Glucose, Bld: 117 mg/dL — ABNORMAL HIGH (ref 70–99)
Potassium: 5.2 mmol/L — ABNORMAL HIGH (ref 3.5–5.1)
Sodium: 135 mmol/L (ref 135–145)
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
## 2023-12-16 NOTE — Patient Instructions (Addendum)
 Day of cardioversion reduce amiodarone to 200mg  once a day  Cardioversion scheduled for: Tuesday, April 1st   - Arrive at the [facility] and go to admitting at 7:30am   - Do not eat or drink anything after midnight the night prior to your procedure.   - Take all your morning medication (except diabetic medications) with a sip of water prior to arrival.  - You will not be able to drive home after your procedure.    - Do NOT miss any doses of your blood thinner - if you should miss a dose please notify our office immediately.   - If you feel as if you go back into normal rhythm prior to scheduled cardioversion, please notify our office immediately.   If your procedure is canceled in the cardioversion suite you will be charged a cancellation fee.

## 2023-12-17 ENCOUNTER — Telehealth: Payer: Self-pay | Admitting: Cardiovascular Disease

## 2023-12-17 NOTE — Telephone Encounter (Signed)
 Patient notified

## 2023-12-17 NOTE — Telephone Encounter (Signed)
 Pt c/o medication issue:  1. Name of Medication: Metoprolol Succinate  2. How are you currently taking this medication (dosage and times per day)? Not currently taking  3. Are you having a reaction (difficulty breathing--STAT)? No   4. What is your medication issue? Pt went to afib clinic yesterday and they heard aflutter and pt wants to know if he needs to go back on the medication

## 2023-12-29 NOTE — Progress Notes (Signed)
 Called patient with pre-procedure instructions for tomorrow.   Patient informed of:   Time to arrive for procedure. 0700 Remain NPO past midnight.  Must have a ride home and a responsible adult to remain with them for 24 hours post procedure.  Confirmed blood thinner. Eliquis Confirmed no breaks in taking blood thinner for 3+ weeks prior to procedure. Confirmed patient stopped all GLP-1s and GLP-2s for at least one week before procedure.

## 2023-12-30 ENCOUNTER — Ambulatory Visit (HOSPITAL_COMMUNITY): Admitting: Certified Registered Nurse Anesthetist

## 2023-12-30 ENCOUNTER — Other Ambulatory Visit: Payer: Self-pay

## 2023-12-30 ENCOUNTER — Encounter (HOSPITAL_COMMUNITY): Payer: Self-pay | Admitting: Internal Medicine

## 2023-12-30 ENCOUNTER — Encounter (HOSPITAL_COMMUNITY)
Admission: RE | Disposition: A | Discharge status: Home / Self Care | Payer: Self-pay | Source: Home / Self Care | Attending: Internal Medicine

## 2023-12-30 ENCOUNTER — Ambulatory Visit (HOSPITAL_COMMUNITY)
Admission: RE | Admit: 2023-12-30 | Discharge: 2023-12-30 | Disposition: A | Discharge status: Home / Self Care | Attending: Internal Medicine | Admitting: Internal Medicine

## 2023-12-30 DIAGNOSIS — Z7901 Long term (current) use of anticoagulants: Secondary | ICD-10-CM | POA: Insufficient documentation

## 2023-12-30 DIAGNOSIS — I10 Essential (primary) hypertension: Secondary | ICD-10-CM

## 2023-12-30 DIAGNOSIS — I4892 Unspecified atrial flutter: Secondary | ICD-10-CM

## 2023-12-30 DIAGNOSIS — E785 Hyperlipidemia, unspecified: Secondary | ICD-10-CM | POA: Diagnosis not present

## 2023-12-30 DIAGNOSIS — I4819 Other persistent atrial fibrillation: Secondary | ICD-10-CM | POA: Insufficient documentation

## 2023-12-30 DIAGNOSIS — Z79899 Other long term (current) drug therapy: Secondary | ICD-10-CM | POA: Insufficient documentation

## 2023-12-30 DIAGNOSIS — D6869 Other thrombophilia: Secondary | ICD-10-CM | POA: Diagnosis not present

## 2023-12-30 HISTORY — PX: CARDIOVERSION: EP1203

## 2023-12-30 SURGERY — CARDIOVERSION (CATH LAB)
Anesthesia: General

## 2023-12-30 MED ORDER — SODIUM CHLORIDE 0.9% FLUSH: 3.0000 mL | INTRAVENOUS | Status: DC | PRN

## 2023-12-30 MED ORDER — SODIUM CHLORIDE 0.9% FLUSH: 3.0000 mL | Freq: Two times a day (BID) | INTRAVENOUS | Status: DC

## 2023-12-30 MED ORDER — PROPOFOL 10 MG/ML IV BOLUS
INTRAVENOUS | Status: DC | PRN
  Administered 2023-12-30: 20 mg via INTRAVENOUS
  Administered 2023-12-30: 70 mg via INTRAVENOUS

## 2023-12-30 SURGICAL SUPPLY — 1 items: PAD DEFIB RADIO PHYSIO CONN (PAD) ×1 IMPLANT

## 2023-12-30 NOTE — Transfer of Care (Signed)
 Immediate Anesthesia Transfer of Care Note  Patient: James [name]  Procedure(s) Performed: CARDIOVERSION  Patient Location: PACU  Anesthesia Type:General  Level of Consciousness: awake and alert   Airway & Oxygen Therapy: Patient Spontanous Breathing and Patient connected to nasal cannula oxygen  Post-op Assessment: Report given to RN and Post -op Vital signs reviewed and stable  Post vital signs: Reviewed and stable  Last Vitals:  Vitals Value Taken Time  BP    Temp    Pulse 66 12/30/23 0826  Resp 18 12/30/23 0826  SpO2 93 % 12/30/23 0826  Vitals shown include unfiled device data.  Last Pain:  Vitals:   12/30/23 0745  TempSrc:   PainSc: 0-No pain         Complications: No notable events documented.

## 2023-12-30 NOTE — Discharge Instructions (Signed)

## 2023-12-30 NOTE — Anesthesia Preprocedure Evaluation (Addendum)
 Anesthesia Evaluation  Patient identified by MRN, date of birth, ID band Patient awake    Reviewed: Allergy & Precautions, NPO status , Patient's Chart, lab work & pertinent test results  Airway Mallampati: II  TM Distance: >3 FB Neck ROM: Full    Dental no notable dental hx.    Pulmonary neg pulmonary ROS   Pulmonary exam normal        Cardiovascular hypertension, Pt. on medications + dysrhythmias Atrial Fibrillation  Rhythm:Irregular Rate:Normal     Neuro/Psych negative neurological ROS  negative psych ROS   GI/Hepatic Neg liver ROS,GERD  ,,  Endo/Other  negative endocrine ROS    Renal/GU negative Renal ROS  negative genitourinary   Musculoskeletal negative musculoskeletal ROS (+)    Abdominal Normal abdominal exam  (+)   Peds  Hematology   Anesthesia Other Findings   Reproductive/Obstetrics                             Anesthesia Physical Anesthesia Plan  ASA: 3  Anesthesia Plan: General   Post-op Pain Management:    Induction: Intravenous  PONV Risk Score and Plan: 2 and Treatment may vary due to age or medical condition  Airway Management Planned: Mask  Additional Equipment: None  Intra-op Plan:   Post-operative Plan:   Informed Consent: I have reviewed the patients History and Physical, chart, labs and discussed the procedure including the risks, benefits and alternatives for the proposed anesthesia with the patient or authorized representative who has indicated his/her understanding and acceptance.     Dental advisory given  Plan Discussed with: [name]  Anesthesia Plan Comments:        Anesthesia Quick Evaluation

## 2023-12-30 NOTE — CV Procedure (Signed)
 Procedure: Electrical Cardioversion Indications:  Atrial Flutter  Procedure Details:  Consent: Risks of procedure as well as the alternatives and risks of each were explained to the (patient/caregiver).  Consent for procedure obtained.  Time Out: Verified patient identification, verified procedure, site/side was marked, verified correct patient position, special equipment/implants available, medications/allergies/relevent history reviewed, required imaging and test results available. PERFORMED.  Patient placed on cardiac monitor, pulse oximetry, supplemental oxygen as necessary.  Sedation given:  propofol Pacer pads placed anterior and posterior chest.  Cardioverted 1 time(s).  Cardioversion with synchronized biphasic 200J shock.  Evaluation: Findings: Post procedure EKG shows: NSR Complications: None Patient did tolerate procedure well.  Time Spent Directly with the Patient:  15 minutes   [name] 12/30/2023, 8:25 AM

## 2023-12-30 NOTE — Anesthesia Postprocedure Evaluation (Signed)
 Anesthesia Post Note  Patient: James [name]  Procedure(s) Performed: CARDIOVERSION     Patient location during evaluation: PACU Anesthesia Type: General Level of consciousness: awake and alert Pain management: pain level controlled Vital Signs Assessment: post-procedure vital signs reviewed and stable Respiratory status: spontaneous breathing, nonlabored ventilation, respiratory function stable and patient connected to nasal cannula oxygen Cardiovascular status: blood pressure returned to baseline and stable Postop Assessment: no apparent nausea or vomiting Anesthetic complications: no   No notable events documented.  Last Vitals:  Vitals:   12/30/23 0827 12/30/23 0837  BP: 133/77 102/68  Pulse: 63 62  Resp: 18 (!) 21  Temp: 36.4 C   SpO2: 96% 97%    Last Pain:  Vitals:   12/30/23 0837  TempSrc:   PainSc: 0-No pain                 Earl Lites P Infinity Jeffords

## 2023-12-30 NOTE — Interval H&P Note (Signed)
 History and Physical Interval Note:  12/30/2023 7:49 AM  James [name]  has presented today for surgery, with the diagnosis of afib.  The various methods of treatment have been discussed with the patient and family. After consideration of risks, benefits and other options for treatment, the patient has consented to  Procedure(s): CARDIOVERSION (N/A) as a surgical intervention.  The patient's history has been reviewed, patient examined, no change in status, stable for surgery.  I have reviewed the patient's chart and labs.  Questions were answered to the patient's satisfaction.     [name]

## 2024-01-13 ENCOUNTER — Emergency Department (HOSPITAL_COMMUNITY)

## 2024-01-13 ENCOUNTER — Telehealth: Payer: Self-pay | Admitting: Cardiology

## 2024-01-13 ENCOUNTER — Emergency Department (HOSPITAL_COMMUNITY)
Admission: EM | Admit: 2024-01-13 | Discharge: 2024-01-13 | Disposition: A | Discharge status: Home / Self Care | Attending: Emergency Medicine | Admitting: Emergency Medicine

## 2024-01-13 ENCOUNTER — Encounter (HOSPITAL_COMMUNITY): Payer: Self-pay

## 2024-01-13 ENCOUNTER — Encounter: Payer: Self-pay | Admitting: Cardiovascular Disease

## 2024-01-13 DIAGNOSIS — I483 Typical atrial flutter: Secondary | ICD-10-CM | POA: Insufficient documentation

## 2024-01-13 DIAGNOSIS — I4892 Unspecified atrial flutter: Secondary | ICD-10-CM | POA: Diagnosis not present

## 2024-01-13 DIAGNOSIS — I4891 Unspecified atrial fibrillation: Secondary | ICD-10-CM | POA: Diagnosis not present

## 2024-01-13 DIAGNOSIS — R918 Other nonspecific abnormal finding of lung field: Secondary | ICD-10-CM | POA: Diagnosis not present

## 2024-01-13 DIAGNOSIS — R000 Tachycardia, unspecified: Secondary | ICD-10-CM | POA: Diagnosis present

## 2024-01-13 DIAGNOSIS — Z7901 Long term (current) use of anticoagulants: Secondary | ICD-10-CM | POA: Diagnosis not present

## 2024-01-13 DIAGNOSIS — R42 Dizziness and giddiness: Secondary | ICD-10-CM

## 2024-01-13 DIAGNOSIS — R0989 Other specified symptoms and signs involving the circulatory and respiratory systems: Secondary | ICD-10-CM | POA: Diagnosis not present

## 2024-01-13 LAB — BASIC METABOLIC PANEL WITH GFR
Anion gap: 10 (ref 5–15)
BUN: 8 mg/dL (ref 8–23)
CO2: 23 mmol/L (ref 22–32)
Calcium: 8.9 mg/dL (ref 8.9–10.3)
Chloride: 102 mmol/L (ref 98–111)
Creatinine, Ser: 1.14 mg/dL (ref 0.61–1.24)
GFR, Estimated: >60 mL/min (ref >60)
Glucose, Bld: 107 mg/dL — ABNORMAL HIGH (ref 70–99)
Potassium: 4.3 mmol/L (ref 3.5–5.1)
Sodium: 135 mmol/L (ref 135–145)

## 2024-01-13 LAB — CBC
HCT: 42.4 % (ref 39.0–52.0)
Hemoglobin: 14.5 g/dL (ref 13.0–17.0)
MCH: 34.4 pg — ABNORMAL HIGH (ref 26.0–34.0)
MCHC: 34.2 g/dL (ref 30.0–36.0)
MCV: 100.7 fL — ABNORMAL HIGH (ref 80.0–100.0)
Platelets: 205 10*3/uL (ref 150–400)
RBC: 4.21 MIL/uL — ABNORMAL LOW (ref 4.22–5.81)
RDW: 12.3 % (ref 11.5–15.5)
WBC: 4.2 10*3/uL (ref 4.0–10.5)
nRBC: 0 % (ref 0.0–0.2)

## 2024-01-13 MED ORDER — AMIODARONE HCL 200 MG PO TABS
200.0000 mg | ORAL_TABLET | Freq: Two times a day (BID) | ORAL | 0 refills | Status: DC
Start: 2024-01-13 — End: 2024-02-24

## 2024-01-13 NOTE — Telephone Encounter (Signed)
 Spoke with pt regarding his elevated heart rate. Pt stated his HR has been consistently in the 100s for the last couple of days. Pt stated he does not have any recent blood pressures but remembers it being roughly 120/80s at the beginning of the month. Pt is using his Apple Watch to monitor his HR. Pt is experiencing lightheadedness and feels fatigued. Pt was told to report to the ED since he is symptomatic. Pt verbalized understanding. All questions if any were answered.

## 2024-01-13 NOTE — ED Provider Notes (Signed)
 Care transferred to me.  Discussed with cardiology, Leala Prince, who has cleared patient for discharge and they will increase his amiodarone back up to 200 mg twice daily.  Patient is comfortable with this plan and will be discharged home with return precautions.   Jerilynn Montenegro, MD 01/13/24 [phone]

## 2024-01-13 NOTE — Telephone Encounter (Signed)
 STAT if HR is under 50 or over 120 (normal HR is 60-100 beats per minute)  What is your heart rate? 102 bpm  Do you have a log of your heart rate readings (document readings)?   Patient stated he has an Apple watch  Do you have any other symptoms?   Lightheaded, tired/lack of energy   Patient stated his heart rate has been increasing since last week this morning it was 102 bpm.  Patient stated he has been taking his medications as prescribed.

## 2024-01-13 NOTE — ED Triage Notes (Signed)
 Was sent by PCP to be evaluated for tachycardia,  HR been 102 Been feeling little lightheaded

## 2024-01-13 NOTE — Discharge Instructions (Signed)
 Increase your amiodarone from once a day to twice per day as instructed by the cardiologist.  Otherwise, keep your appointment for next week.  If you develop chest pain, dizziness, or any other new/concerning symptoms then return to the ER for evaluation.

## 2024-01-13 NOTE — ED Provider Notes (Signed)
 [place] EMERGENCY DEPARTMENT AT [facility] Provider Note   CSN: [id number] Arrival date & time: 01/13/24  1100     History  Chief Complaint  Patient presents with   Tachycardia    James [name] is a 78 y.o. male.  Patient with significant history of multiple episodes of atrial fibrillation and atrial flutter with history of cardioversion and ablation multiple times.  Patient follows closely with Dr. Anne Fu in the cardiology group.  Patient has had increased heart rate up to 100 over the past few days and more symptoms primarily lightheadedness.  Patient when he had his ablation heart rate was in the 50s and gradually is worsened since then.  No fever or chills no chest pain or abdominal pain.  No history of stroke or heart attack.  Patient compliant with his anticoagulant.  The history is provided by the patient.       Home Medications Prior to Admission medications   Medication Sig Start Date End Date Taking? Authorizing Provider  alfuzosin (UROXATRAL) 10 MG 24 hr tablet Take 10 mg by mouth daily with breakfast.    [provider]  allopurinol (ZYLOPRIM) 100 MG tablet Take 100 mg by mouth 2 (two) times daily.    [provider]  amiodarone (PACERONE) 200 MG tablet Take 1 tablet by mouth twice a day for 30 days then reduce to once a day Patient taking differently: Take 200 mg by mouth 2 (two) times daily. Take 1 tablet by mouth twice a day for 30 days then reduce to once a day 11/28/23   Eustace Pen, PA-C  apixaban (ELIQUIS) 5 MG TABS tablet Take 5 mg by mouth 2 (two) times daily. 09/21/21   [provider]  atorvastatin (LIPITOR) 20 MG tablet Take 20 mg by mouth at bedtime. 04/30/16   [provider]  Cholecalciferol (VITAMIN D3 PO) Take 1 tablet by mouth 3 (three) times a week.    [provider]  Coenzyme Q10 (CO Q-10) 100 MG CAPS Take 100 mg by mouth daily.    [provider]  ELDERBERRY PO Take 1 capsule by  mouth once a week.    [provider]  fish oil-omega-3 fatty acids 1000 MG capsule Take 1 g by mouth daily.     [provider]  fluticasone (FLONASE) 50 MCG/ACT nasal spray Place 2 sprays into both nostrils daily as needed for allergies or rhinitis.     [provider]  hydrocortisone 2.5 % cream Apply 1 Application topically 2 (two) times daily as needed (rash). 10/21/23   [provider]  MAGNESIUM PO Take 250 mg by mouth once a week.    [provider]  Multiple Vitamin (MULTIVITAMIN WITH MINERALS) TABS Take 1 tablet by mouth daily. Centrum Silver    [provider]  omeprazole (PRILOSEC OTC) 20 MG tablet Take 20 mg every evening by mouth.     [provider]  sildenafil (REVATIO) 20 MG tablet Take 40-60 mg by mouth daily as needed (ED).    [provider]  telmisartan (MICARDIS) 40 MG tablet Take 40 mg by mouth daily. 04/26/23   [provider]  zinc gluconate 50 MG tablet Take 50 mg by mouth once a week.    [provider]      Allergies    Iodinated contrast media and Iodine    Review of Systems   Review of Systems  Constitutional:  Negative for chills and fever.  HENT:  Negative for congestion.   Eyes:  Negative for visual disturbance.  Respiratory:  Negative for shortness of breath.   Cardiovascular:  Positive for palpitations. Negative for chest pain.  Gastrointestinal:  Negative for abdominal pain and vomiting.  Genitourinary:  Negative for dysuria and flank pain.  Musculoskeletal:  Negative for back pain, neck pain and neck stiffness.  Skin:  Negative for rash.  Neurological:  Positive for light-headedness. Negative for headaches.    Physical Exam Updated Vital Signs BP (!) 143/90   Pulse 92   Temp (!) 97.3 F (36.3 C)   Resp 13   SpO2 97%  Physical Exam Vitals and nursing note reviewed.  Constitutional:      General: He is not in acute distress.    Appearance: He is  well-developed.  HENT:     Head: Normocephalic and atraumatic.     Mouth/Throat:     Mouth: Mucous membranes are moist.  Eyes:     General:        Right eye: No discharge.        Left eye: No discharge.     Conjunctiva/sclera: Conjunctivae normal.  Neck:     Trachea: No tracheal deviation.  Cardiovascular:     Rate and Rhythm: Regular rhythm. Tachycardia present.  Pulmonary:     Effort: Pulmonary effort is normal.     Breath sounds: Normal breath sounds.  Abdominal:     General: There is no distension.     Palpations: Abdomen is soft.     Tenderness: There is no abdominal tenderness. There is no guarding.  Musculoskeletal:     Cervical back: Normal range of motion and neck supple. No rigidity.  Skin:    General: Skin is warm.     Capillary Refill: Capillary refill takes less than 2 seconds.     Findings: No rash.  Neurological:     General: No focal deficit present.     Mental Status: He is alert.     Cranial Nerves: No cranial nerve deficit.  Psychiatric:        Mood and Affect: Mood normal.     ED Results / Procedures / Treatments   Labs (all labs ordered are listed, but only abnormal results are displayed) Labs Reviewed  BASIC METABOLIC PANEL WITH GFR - Abnormal; Notable for the following components:      Result Value   Glucose, Bld 107 (*)    All other components within normal limits  CBC - Abnormal; Notable for the following components:   RBC 4.21 (*)    MCV 100.7 (*)    MCH 34.4 (*)    All other components within normal limits    EKG EKG Interpretation Date/Time:  Tuesday January 13 2024 11:13:40 EDT Ventricular Rate:  101 PR Interval:    QRS Duration:  90 QT Interval:  354 QTC Calculation: 459 R Axis:   -45  Text Interpretation: Atrial flutter with 2:1 A-V conduction Left anterior fascicular block Anterior infarct , age undetermined When compared with ECG of 30-Dec-2023 08:30, PREVIOUS ECG IS PRESENT Confirmed by Clay Cummins [id number]) on 01/13/2024  2:33:49 PM  Radiology No results found.  Procedures Procedures    Medications Ordered in ED Medications - No data to display  ED Course/ Medical Decision Making/ A&P                                 Medical Decision Making Amount  and/or Complexity of Data Reviewed Labs: ordered.   Patient with known cardiac history presents with clinical concern for uncontrolled atrial flutter.  EKG reviewed consistent with 2-1 and with significant medical history and symptoms cardiology paged for further evaluation recommendation.  Patient had blood work ordered and no signs of significant anemia or or electrolyte abnormalities to explain his worsening signs and symptoms.  No clinical signs of heart failure or pulm edema at this time.  Patient care be signed out to follow-up recommendations and continue to monitor. Medical records reviewed on April 1 patient had cardioversion performed details in note.       Final Clinical Impression(s) / ED Diagnoses Final diagnoses:  Typical atrial flutter (HCC)  Lightheadedness    Rx / DC Orders ED Discharge Orders     None         Clay Cummins, MD 01/13/24 [phone]

## 2024-01-13 NOTE — Telephone Encounter (Signed)
 See phone note

## 2024-01-13 NOTE — Consult Note (Cosign Needed)
 Cardiology Consultation   Patient ID: James [name] MRN: [id number]; DOB: [birth year]  Admit date: 01/13/2024 Date of Consult: 01/13/2024  PCP:  James Aspen, MD    [facility]Care Providers Cardiologist:  James Schultz, MD  Electrophysiologist:  James Small, MD       Patient Profile:   James [name] is a 77 y.o. male with a hx of MVR in 2014 and atrial flutter (s/p ablation by Dr. Johney [name] in 2018, multiple cardioversions, repeat ablation with Dr. Nelly [name] 2025), hypertension, hyperlipidemia who is being seen 01/13/2024 for the evaluation of symptomatic atrial flutter at the request of Dr. Jodi [name].  History of Present Illness:   Mr. James [name] has history of recurrent atrial fibrillation atrial flutter.  He was previously followed by Dr. Johney [name] and underwent atrial flutter ablation in 2018.  Following this ablation, patient found to have recurrent irregular rhythm in 2023 by PCP and was referred to atrial fibrillation clinic.  Underwent cardioversion in January 2023.  Patient with recurrent atrial flutter in May 2024 and again had cardioversion.  Patient with cardioversions in October 2024, in December 2024, in January 2025.  Patient underwent pulsed field energy pulmonary vein ablation x4 along with ablation of posterior wall of left atrium with Dr. Nelly [name] on October 31, 2023.  Following this procedure, patient with brief junctional rhythm.  At time of discharge was in normal sinus rhythm with first-degree AV block and was discharged on continued amiodarone and Eliquis therapy.  Patient seen in A-fib clinic for follow-up on 11/28/2023, was unfortunately noted to be back in atrial flutter.  At the time of that visit, patient denied symptoms of arrhythmia but noted Apple Watch reporting heart rate in the 90s to 100s.  Following this visit, patient reloaded on amiodarone with 200 mg twice daily x 4 weeks and then back to 200 mg daily.  Patient was seen in follow-up on 12/16/2023, was  still in atrial flutter.  He was set up for repeat cardioversion which was completed on 12/30/2023.    This morning, patient sent a MyChart message reporting heart rate around 100 bpm.  Patient reported fatigue and lightheadedness and was advised to proceed to the emergency department by clinic staff.  In the emergency department today, patient's ECG shows 2:1 atrial flutter with ventricular rate around 91 bpm.  This tracing is essentially identical to that seen recently in A-fib clinic.  Labs today unremarkable. On exam, patient denies focal symptoms other than mild dizziness this morning. Clarifies that fatigue is chronic not acute. No dyspnea, chest pain, shortness of breath.   Past Medical History:  Diagnosis Date   Barrett esophagus 2002   Erectile dysfunction    GERD (gastroesophageal reflux disease)    Hyperlipidemia    Hypertension    Pneumonia 2000   hospitalized   S/P mitral valve repair 01/06/2013   Complex valvuloplasty including triangular resection of flail segment of posterior leaflet, artificial Gore-tex neocord placement x4 and 32 mm Sorin Memo 3D ring annuloplasty via right mini thoracotomy   Severe mitral regurgitation     Past Surgical History:  Procedure Laterality Date   A-FLUTTER ABLATION N/A 08/26/2017   Procedure: A-FLUTTER ABLATION;  Surgeon: Hillis Range, MD;  Location: [facility] INVAS[facility] CV LAB;  Service: Cardiovascular;  Laterality: N/A;   ATRIAL FIBRILLATION ABLATION N/A 10/31/2023   Procedure: ATRIAL FIBRILLATION ABLATION;  Surgeon: James Small, MD;  Location: [facility];  Service: Cardiovascular;  Laterality: N/A;   CARDIAC CATHETERIZATION  CARDIOVERSION N/A 07/01/2017   Procedure: CARDIOVERSION;  Surgeon: Lewayne Bunting, MD;  Location: [facility] ENDOSCOPY;  Service: Cardiovascular;  Laterality: N/A;   CARDIOVERSION N/A 10/16/2021   Procedure: CARDIOVERSION;  Surgeon: Jake Bathe, MD;  Location: [facility] ENDOSCOPY;  Service: Cardiovascular;  Laterality:  N/A;   CARDIOVERSION N/A 01/30/2023   Procedure: CARDIOVERSION;  Surgeon: Sande Rives, MD;  Location: [facility] INVASIVE CV LAB;  Service: Cardiovascular;  Laterality: N/A;   CARDIOVERSION N/A 07/23/2023   Procedure: CARDIOVERSION;  Surgeon: Quintella Reichert, MD;  Location: [facility] INVAS[facility] CV LAB;  Service: Cardiovascular;  Laterality: N/A;   CARDIOVERSION N/A 09/02/2023   Procedure: CARDIOVERSION (CATH LAB);  Surgeon: Thomasene Ripple, DO;  Location: [facility];  Service: Cardiovascular;  Laterality: N/A;   CARDIOVERSION N/A 10/03/2023   Procedure: CARDIOVERSION;  Surgeon: Chrystie Nose, MD;  Location: [facility];  Service: Cardiovascular;  Laterality: N/A;   CARDIOVERSION N/A 12/30/2023   Procedure: CARDIOVERSION;  Surgeon: [name], MD;  Location: [facility] IN[facility] LAB;  Service: Cardiovascular;  Laterality: N/A;   COLONOSCOPY     INTRAOPERATIVE TRANSESOPHAGEAL ECHOCARDIOGRAM N/A 01/06/2013   Procedure: INTRAOPERATIVE TRANSESOPHAGEAL ECHOCARDIOGRAM;  Surgeon: Purcell Nails, MD;  Location: [facility] OR;  Service: Open Heart Surgery;  Laterality: N/A;   MITRAL VALVE REPAIR Right 01/06/2013   Procedure: MINIMALLY INVASIVE MITRAL VALVE REPAIR (MVR);  Surgeon: Purcell Nails, MD;  Location: [facility] OR;  Service: Open Heart Surgery;  Laterality: Right;   TEE WITHOUT CARDIOVERSION N/A 12/03/2012   Procedure: TRANSESOPHAGEAL ECHOCARDIOGRAM (TEE);  Surgeon: James Schultz, MD;  Location: [facility] ENDOSCOPY;  Service: Cardiovascular;  Laterality: N/A;  h/p in file cabinet-ja   TEE WITHOUT CARDIOVERSION N/A 07/01/2017   Procedure: TRANSESOPHAGEAL ECHOCARDIOGRAM (TEE);  Surgeon: Lewayne Bunting, MD;  Location: [facility] ENDOSCOPY;  Service: Cardiovascular;  Laterality: N/A;   TRANSESOPHAGEAL ECHOCARDIOGRAM (CATH LAB) N/A 10/31/2023   Procedure: TRANSESOPHAGEAL ECHOCARDIOGRAM;  Surgeon: James Small, MD;  Location: [facility] INVASIVE CV LAB;  Service: Cardiovascular;  Laterality: N/A;       Inpatient  Medications: Scheduled Meds:  Continuous Infusions:  PRN Meds:   Allergies:    Allergies  Allergen Reactions   Iodinated Contrast Media Itching and Swelling    Rash, itching back approx 6 hours after scan Facial sewlling approx 10 hours after scan MD morning after scan gave steroid shot - cleared after 2 hours   Iodine Itching, Swelling and Other (See Comments)    Dye from a CT scan.  Had reaction. Lips and face swelling    Social History:   Social History   Socioeconomic History   Marital status: Married    Spouse name: Not on file   Number of children: 3   Years of education: Not on file   Highest education level: Not on file  Occupational History   Occupation: retired    Comment: Publishing copy  Tobacco Use   Smoking status: Never   Smokeless tobacco: Never   Tobacco comments:    Never smoked 07/22/23  Vaping Use   Vaping status: Never Used  Substance and Sexual Activity   Alcohol use: Yes    Alcohol/week: 21.0 standard drinks of alcohol    Types: 7 Glasses of wine, 14 Cans of beer per week    Comment: couple glasses of wine nightly 07/22/23   Drug use: No   Sexual activity: Not on file  Other Topics Concern   Not on file  Social History Narrative  Not on file   Social Drivers of Health   Financial Resource Strain: Low Risk  (12/17/2022)   Received from [facility]   Overall Financial Resource Strain (CARDIA)    Difficulty of Paying Living Expenses: Not hard at all  Food Insecurity: No Food Insecurity (10/31/2023)   Hunger Vital Sign    Worried About Running Out of Food in the Last Year: Never true    Ran Out of Food in the Last Year: Never true  Transportation Needs: No Transportation Needs (10/31/2023)   PRAPARE - Administrator, Civil Service (Medical): No    Lack of Transportation (Non-Medical): No  Physical Activity: Not on file  Stress: Not on file  Social Connections: Unknown  (10/31/2023)   Social Connection and Isolation Panel [NHANES]    Frequency of Communication with Friends and Family: More than three times a week    Frequency of Social Gatherings with Friends and Family: More than three times a week    Attends Religious Services: Not on file    Active Member of Clubs or Organizations: Yes    Attends Banker Meetings: 1 to 4 times per year    Marital Status: Married  Catering manager Violence: Not At Risk (10/31/2023)   Humiliation, Afraid, Rape, and Kick questionnaire    Fear of Current or Ex-Partner: No    Emotionally Abused: No    Physically Abused: No    Sexually Abused: No    Family History:    Family History  Problem Relation Age of Onset   Hypertension Mother    COPD Mother    Heart failure Mother      ROS:  Please see the history of present illness.   All other ROS reviewed and negative.     Physical Exam/Data:   Vitals:   01/13/24 1417 01/13/24 1430 01/13/24 1445 01/13/24 1500  BP: (!) 141/93 (!) 133/93 (!) 143/90 (!) 146/98  Pulse: 95 93 92 92  Resp: 18 17 13  (!) 26  Temp: (!) 97.3 F (36.3 C)     SpO2: 100% 99% 97% 100%   No intake or output data in the 24 hours ending 01/13/24 1523    12/16/2023   11:20 AM 11/28/2023   10:54 AM 10/31/2023    9:18 AM  Last 3 Weights  Weight (lbs) 197 lb 3.2 oz 195 lb 185 lb  Weight (kg) 89.449 kg 88.451 kg 83.915 kg     There is no height or weight on file to calculate BMI.  General:  Well nourished, well developed, in no acute distress HEENT: normal Neck: no JVD Vascular: No carotid bruits; Distal pulses 2+ bilaterally Cardiac:  normal S1, S2; RRR; no murmur  Lungs:  clear to auscultation bilaterally, no wheezing, rhonchi or rales  Abd: soft, nontender, no hepatomegaly  Ext: no edema Musculoskeletal:  No deformities, BUE and BLE strength normal and equal Skin: warm and dry  Neuro:  CNs 2-12 intact, no focal abnormalities noted Psych:  Normal affect   EKG:  The EKG  was personally reviewed and demonstrates:  2:1 atrial flutter with ventricular rate 91 Telemetry:  Telemetry was personally reviewed and demonstrates:  2:1 atrial flutter with fixed ventricular rate 91  Relevant CV Studies:  02/18/23 TTE  IMPRESSIONS     1. Left ventricular ejection fraction, by estimation, is 60 to 65%. The  left ventricle has normal function. The left ventricle has no regional  wall motion abnormalities. There is moderate left ventricular hypertrophy.  Left ventricular diastolic  parameters are indeterminate.   2. Right ventricular systolic function is normal. The right ventricular  size is normal. There is normal pulmonary artery systolic pressure. The  estimated right ventricular systolic pressure is 22.9 mmHg.   3. Left atrial size was severely dilated.   4. Right atrial size was moderately dilated.   5. The mitral valve has been repaired/replaced. No evidence of mitral  valve regurgitation. Mild mitral stenosis. The mean mitral valve gradient  is 4.0 mmHg. There is a 32 mm prosthetic annuloplasty ring present in the  mitral position. Procedure Date:  01/06/2013. Echo findings are consistent with normal structure and function  of the mitral valve prosthesis.   6. The aortic valve is normal in structure. Aortic valve regurgitation is  trivial. No aortic stenosis is present.   7. Aortic dilatation noted. There is mild dilatation of the aortic root,  measuring 43 mm.   8. The inferior vena cava is normal in size with greater than 50%  respiratory variability, suggesting right atrial pressure of 3 mmHg.   Comparison(s): Prior images reviewed side by side.   FINDINGS   Left Ventricle: Left ventricular ejection fraction, by estimation, is 60  to 65%. The left ventricle has normal function. The left ventricle has no  regional wall motion abnormalities. The left ventricular internal cavity  size was normal in size. There is   moderate left ventricular hypertrophy.  Left ventricular diastolic  parameters are indeterminate.   Right Ventricle: The right ventricular size is normal. No increase in  right ventricular wall thickness. Right ventricular systolic function is  normal. There is normal pulmonary artery systolic pressure. The tricuspid  regurgitant velocity is 2.23 m/s, and   with an assumed right atrial pressure of 3 mmHg, the estimated right  ventricular systolic pressure is 22.9 mmHg.   Left Atrium: Left atrial size was severely dilated.   Right Atrium: Right atrial size was moderately dilated.   Pericardium: There is no evidence of pericardial effusion.   Mitral Valve: The mitral valve has been repaired/replaced. No evidence of  mitral valve regurgitation. There is a 32 mm prosthetic annuloplasty ring  present in the mitral position. Procedure Date: 01/06/2013. Echo findings  are consistent with normal  structure and function of the mitral valve prosthesis. Mild mitral valve  stenosis. MV peak gradient, 6.6 mmHg. The mean mitral valve gradient is  4.0 mmHg.   Tricuspid Valve: The tricuspid valve is normal in structure. Tricuspid  valve regurgitation is trivial. No evidence of tricuspid stenosis.   Aortic Valve: The aortic valve is normal in structure. Aortic valve  regurgitation is trivial. Aortic regurgitation PHT measures 573 msec. No  aortic stenosis is present.   Pulmonic Valve: The pulmonic valve was normal in structure. Pulmonic valve  regurgitation is mild. No evidence of pulmonic stenosis.   Aorta: Aortic dilatation noted. There is mild dilatation of the aortic  root, measuring 43 mm.   Venous: The inferior vena cava is normal in size with greater than 50%  respiratory variability, suggesting right atrial pressure of 3 mmHg.   IAS/Shunts: No atrial level shunt detected by color flow Doppler.    Laboratory Data:  High Sensitivity Troponin:  No results for input(s): "TROPONINIHS" in the last 720 hours.    Chemistry Recent Labs  Lab 01/13/24 1112  NA 135  K 4.3  CL 102  CO2 23  GLUCOSE 107*  BUN 8  CREATININE 1.14  CALCIUM 8.9  GFRNONAA >60  ANIONGAP 10    No results for input(s): "PROT", "ALBUMIN", "AST", "ALT", "ALKPHOS", "BILITOT" in the last 168 hours. Lipids No results for input(s): "CHOL", "TRIG", "HDL", "LABVLDL", "LDLCALC", "CHOLHDL" in the last 168 hours.  Hematology Recent Labs  Lab 01/13/24 1112  WBC 4.2  RBC 4.21*  HGB 14.5  HCT 42.4  MCV 100.7*  MCH 34.4*  MCHC 34.2  RDW 12.3  PLT 205   Thyroid No results for input(s): "TSH", "FREET4" in the last 168 hours.  BNPNo results for input(s): "BNP", "PROBNP" in the last 168 hours.  DDimer No results for input(s): "DDIMER" in the last 168 hours.   Radiology/Studies:  DG Chest 1 View Result Date: 01/13/2024 CLINICAL DATA:  Atrial fibrillation EXAM: CHEST  1 VIEW COMPARISON:  Chest radiograph dated 01/25/2013 FINDINGS: Low lung volumes with bronchovascular crowding. Right basilar hazy opacities. No pleural effusion or pneumothorax. Similar cardiomediastinal silhouette. No acute osseous abnormality. IMPRESSION: Low lung volumes with bronchovascular crowding. Right basilar hazy opacities, likely atelectasis. Electronically Signed   By: Agustin Cree M.D.   On: 01/13/2024 15:20     Assessment and Plan:   Persistent atrial flutter Atrial fibrillation s/p ablation Secondary hypercoagulable state As noted above, patient with significant arrhythmia history.  Underwent initial aflutter ablation with Dr. Johney [name] in 2018.  Recurrent fib in 2023. Since 2023, patient with 5 cardioversions and pulsed field energy afib ablation with pulmonary vein ablation x4 along with ablation of posterior wall of left atrium with Dr. Nelly [name] on October 31, 2023.  Patient had recurrence of atrial flutter within a month of his recent afib ablation. Amiodarone dose temporarily increased and underwent cardioversion in April.  Presents to the emergency  department today after notifying cardiology office today that his heart rate was 102 on his Apple Watch with fatigue. On further evaluation, patient with minimal symptoms, only mild fatigue with brief dizziness this morning. No acute change in symptoms since ablation. Ultimately best option at this point for patient would seem to be EP study and flutter ablation. Patient seems very amenable to the idea of this. He already has follow up with EP clinic next week.  Reassured patient about his HR, discussed that symptoms more important to monitor than raw HR numbers.  Will increase Amiodarone to 200mg  BID for now Continue Eliquis 5mg  BID.   Status post mitral valve repair Repair completed 01/06/2013. Continue serial outpatient imaging.  Risk Assessment/Risk Scores:          CHA2DS2-VASc Score = 3   This indicates a 3.2% annual risk of stroke. The patient's score is based upon: CHF History: 0 HTN History: 1 Diabetes History: 0 Stroke History: 0 Vascular Disease History: 0 Age Score: 2 Gender Score: 0     [facility] will sign off.   Medication Recommendations:  Amiodarone 200mg  BID Other recommendations (labs, testing, etc):  n/a Follow up as an outpatient:  EP clinic on 01/21/24  For questions or updates, please contact [facility] Please consult [email or web address] for contact info under    Signed, Perlie Gold, PA-C  01/13/2024 3:23 PM  Late entry I have seen and examined the patient along with Perlie Gold, PA-C in the [facility] ED.  I have reviewed the chart, notes and new data.  I agree with PA/NP's note.  Key new complaints: he is not symptomatic, but rather was concerned by the increase in heart rate reported by his smart watch Key examination changes:  very regular rhythm, consistently 91 bpm, atrial flutter with 2:1 AV block on telemetry. Otw normal CV exam Key new findings / data: normal labs and CXR  PLAN: Increase amiodarone dose. DC home and keep  scheduled AFib clinic visit.  Luana Rumple, MD, [facility] [phone] 01/14/2024, 8:16 AM

## 2024-01-20 NOTE — Progress Notes (Unsigned)
  Electrophysiology Office Note:   Date:  01/21/2024  ID:  Cambren Helm, DOB [birth year], MRN [id number]  Primary Cardiologist: Dorothye Gathers, MD Electrophysiologist: Efraim Grange, MD      History of Present Illness:   James [name] is a 78 y.o. male with h/o MVR in 2014 and atrial flutter (s/p ablation by Dr. Nunzio Belch in 2018, multiple cardioversions, repeat ablation with Dr. Arlester Ladd 2025), hypertension, hyperlipidemia seen today for routine electrophysiology followup.   S/p AF ablation 10/31/2023.  Unfortunately, has since developed typical flutter.  Seen in ED 4/15 and amiodarone  increased back to 200 mg BID.   Since last being seen in our clinic the patient reports doing OK. He is not symptomatic, only notes that his watch alerts him to tachycardia at rest. Overall, he denies chest pain, palpitations, dyspnea, PND, orthopnea, nausea, vomiting, dizziness, syncope, edema, weight gain, or early satiety.   Review of systems complete and found to be negative unless listed in HPI.   EP Information / Studies Reviewed:    EKG is ordered today. Personal review as below.  EKG Interpretation Date/Time:  Wednesday January 21 2024 10:32:39 EDT Ventricular Rate:  97 PR Interval:    QRS Duration:  102 QT Interval:  378 QTC Calculation: 480 R Axis:   -57  Text Interpretation: Atrial flutter with 2:1 A-V conduction Left anterior fascicular block Anterior infarct (cited on or before 13-Jan-2024) Confirmed by Pilar Bridge [phone]) on 01/21/2024 10:37:02 AM    Arrhythmia/Device History No specialty comments available.   Physical Exam:   VS:  BP 114/76   Pulse 97   Ht 5\' 11"  (1.803 m)   Wt 194 lb 12.8 oz (88.4 kg)   SpO2 94%   BMI 27.17 kg/m    Wt Readings from Last 3 Encounters:  01/21/24 194 lb 12.8 oz (88.4 kg)  12/16/23 197 lb 3.2 oz (89.4 kg)  11/28/23 195 lb (88.5 kg)     GEN: No acute distress NECK: No JVD; No carotid bruits CARDIAC: Regular rate and rhythm, no murmurs, rubs,  gallops RESPIRATORY:  Clear to auscultation without rales, wheezing or rhonchi  ABDOMEN: Soft, non-tender, non-distended EXTREMITIES:  No edema; No deformity   ASSESSMENT AND PLAN:    Persistent atrial fibrillation Persistent atrial flutter EKG today shows Atrial flutter ~100, likely atypical.  S/p flutter ablation 2018 by Dr. Nunzio Belch S/p AF ablation on 10/31/2023 by Dr. Arlester Ladd. Continue amiodarone  200 mg BID for now.  With slower atrial CL and indistinct p waves in inferior leads, concerned about atypical flutter which may be less amenable to ablation, and did review with Dr. Arlester Ladd who agrees but is willing to pursue mapping and possible ablation in the future  For now, as he is just outside of the 3 month mark of ablation, will try to cardiovert again on higher dose of amiodarone , and follow up closely after to further discuss. Rate control may be difficult if he is unable to maintain NSR, but I do not think AV nodal ablation and pacing would be immediately indicated with lack of symptoms.   Follow up with EP APP in 2 weeks, s/p [facility] to re-assess rhythm and plan.   Signed, Tylene Galla, PA-C

## 2024-01-20 NOTE — H&P (View-Only) (Signed)
  Electrophysiology Office Note:   Date:  01/21/2024  ID:  Soo Rall, DOB December 13, 1945, MRN [id number]  Primary Cardiologist: Dorothye Gathers, MD Electrophysiologist: Efraim Grange, MD      History of Present Illness:   Farhad Criner is a 78 y.o. male with h/o MVR in 2014 and atrial flutter (s/p ablation by Dr. Nunzio Belch in 2018, multiple cardioversions, repeat ablation with Dr. Arlester Ladd 2025), hypertension, hyperlipidemia seen today for routine electrophysiology followup.   S/p AF ablation 10/31/2023.  Unfortunately, has since developed typical flutter.  Seen in ED 4/15 and amiodarone  increased back to 200 mg BID.   Since last being seen in our clinic the patient reports doing OK. He is not symptomatic, only notes that his watch alerts him to tachycardia at rest. Overall, he denies chest pain, palpitations, dyspnea, PND, orthopnea, nausea, vomiting, dizziness, syncope, edema, weight gain, or early satiety.   Review of systems complete and found to be negative unless listed in HPI.   EP Information / Studies Reviewed:    EKG is ordered today. Personal review as below.  EKG Interpretation Date/Time:  Wednesday January 21 2024 10:32:39 EDT Ventricular Rate:  97 PR Interval:    QRS Duration:  102 QT Interval:  378 QTC Calculation: 480 R Axis:   -57  Text Interpretation: Atrial flutter with 2:1 A-V conduction Left anterior fascicular block Anterior infarct (cited on or before 13-Jan-2024) Confirmed by Pilar Bridge [phone]) on 01/21/2024 10:37:02 AM    Arrhythmia/Device History No specialty comments available.   Physical Exam:   VS:  BP 114/76   Pulse 97   Ht 5\' 11"  (1.803 m)   Wt 194 lb 12.8 oz (88.4 kg)   SpO2 94%   BMI 27.17 kg/m    Wt Readings from Last 3 Encounters:  01/21/24 194 lb 12.8 oz (88.4 kg)  12/16/23 197 lb 3.2 oz (89.4 kg)  11/28/23 195 lb (88.5 kg)     GEN: No acute distress NECK: No JVD; No carotid bruits CARDIAC: Regular rate and rhythm, no murmurs, rubs,  gallops RESPIRATORY:  Clear to auscultation without rales, wheezing or rhonchi  ABDOMEN: Soft, non-tender, non-distended EXTREMITIES:  No edema; No deformity   ASSESSMENT AND PLAN:    Persistent atrial fibrillation Persistent atrial flutter EKG today shows Atrial flutter ~100, likely atypical.  S/p flutter ablation 2018 by Dr. Nunzio Belch S/p AF ablation on 10/31/2023 by Dr. Arlester Ladd. Continue amiodarone  200 mg BID for now.  With slower atrial CL and indistinct p waves in inferior leads, concerned about atypical flutter which may be less amenable to ablation, and did review with Dr. Arlester Ladd who agrees but is willing to pursue mapping and possible ablation in the future  For now, as he is just outside of the 3 month mark of ablation, will try to cardiovert again on higher dose of amiodarone , and follow up closely after to further discuss. Rate control may be difficult if he is unable to maintain NSR, but I do not think AV nodal ablation and pacing would be immediately indicated with lack of symptoms.   Follow up with EP APP in 2 weeks, s/p [facility] to re-assess rhythm and plan.   Signed, Tylene Galla, PA-C

## 2024-01-21 ENCOUNTER — Ambulatory Visit: Attending: Student | Admitting: Student

## 2024-01-21 ENCOUNTER — Encounter: Payer: Self-pay | Admitting: Student

## 2024-01-21 VITALS — BP 114/76 | HR 97 | Ht 71.0 in | Wt 194.8 lb

## 2024-01-21 DIAGNOSIS — I4819 Other persistent atrial fibrillation: Secondary | ICD-10-CM

## 2024-01-21 DIAGNOSIS — I4892 Unspecified atrial flutter: Secondary | ICD-10-CM

## 2024-01-21 DIAGNOSIS — I340 Nonrheumatic mitral (valve) insufficiency: Secondary | ICD-10-CM

## 2024-01-21 DIAGNOSIS — I059 Rheumatic mitral valve disease, unspecified: Secondary | ICD-10-CM

## 2024-01-21 LAB — CBC

## 2024-01-21 NOTE — Patient Instructions (Signed)
 Medication Instructions:  Your physician recommends that you continue on your current medications as directed. Please refer to the Current Medication list given to you today.  *If you need a refill on your cardiac medications before your next appointment, please call your pharmacy*  Lab Work: BMET, CBC-TODAY If you have labs (blood work) drawn today and your tests are completely normal, you will receive your results only by: MyChart Message (if you have MyChart) OR A paper copy in the mail If you have any lab test that is abnormal or we need to change your treatment, we will call you to review the results.  Testing/Procedures: Cardioversion  Your physician has recommended that you have a Cardioversion (DCCV). Electrical Cardioversion uses a jolt of electricity to your heart either through paddles or wired patches attached to your chest. This is a controlled, usually prescheduled, procedure. Defibrillation is done under light anesthesia in the hospital, and you usually go home the day of the procedure. This is done to get your heart back into a normal rhythm. You are not awake for the procedure. Please see the instruction sheet given to you today.   Follow-Up: At [facility], you and your health needs are our priority.  As part of our continuing mission to provide you with exceptional heart care, our providers are all part of one team.  This team includes your primary Cardiologist (physician) and Advanced Practice Providers or APPs (Physician Assistants and Nurse Practitioners) who all work together to provide you with the care you need, when you need it.  Your next appointment:   1-2 week(s) after cardioversion   Provider:   Joycelyn Noa" Percell Boyers, [place]        Dear Palmer Bobo  You are scheduled for a Cardioversion on Wednesday, May 7 with Dr. Albert Huff.  Please arrive at the [facility] (Main Entrance A) at [facility]: [address] [place], [place] [address] at 8:30  AM (This time is 1 hour(s) before your procedure to ensure your preparation).   Free valet parking service is available. You will check in at ADMITTING.   *Please Note: You will receive a call the day before your procedure to confirm the appointment time. That time may have changed from the original time based on the schedule for that day.*    DIET:  Nothing to eat or drink after midnight except a sip of water with medications (see medication instructions below)  MEDICATION INSTRUCTIONS: !!IF ANY NEW MEDICATIONS ARE STARTED AFTER TODAY, PLEASE NOTIFY YOUR PROVIDER AS SOON AS POSSIBLE!!  FYI: Medications such as Semaglutide (Ozempic, [place]), Tirzepatide (Mounjaro, Zepbound), Dulaglutide (Trulicity), etc ("GLP1 agonists") AND Canagliflozin (Invokana), Dapagliflozin (Farxiga), Empagliflozin (Jardiance), Ertugliflozin (Steglatro), Bexagliflozin [employer]) or any combination with one of these drugs such as Invokamet (Canagliflozin/Metformin), Synjardy (Empagliflozin/Metformin), etc ("SGLT2 inhibitors") must be held around the time of a procedure. This is not a comprehensive list of all of these drugs. Please review all of your medications and talk to your provider if you take any one of these. If you are not sure, ask your provider.     Continue taking your anticoagulant (blood thinner): Apixaban  (Eliquis ).  You will need to continue this after your procedure until you are told by your provider that it is safe to stop.    LABS:   Come to [facility]  to have BMP, CBC  FYI:  For your safety, and to allow us  to monitor your vital signs accurately during the surgery/procedure we request: If you  have artificial nails, gel coating, SNS etc, please have those removed prior to your surgery/procedure. Not having the nail coverings /polish removed may result in cancellation or delay of your surgery/procedure.  Your support person will be asked to wait in the waiting room during your procedure.  It is OK to  have someone drop you off and come back when you are ready to be discharged.  You cannot drive after the procedure and will need someone to drive you home.  Bring your insurance cards.  *Special Note: Every effort is made to have your procedure done on time. Occasionally there are emergencies that occur at the hospital that may cause delays. Please be patient if a delay does occur.          1st Floor: - Lobby - Registration  - Pharmacy  - Lab - Cafe  2nd Floor: - PV Lab - Diagnostic Testing (echo, CT, nuclear med)  3rd Floor: - Vacant  4th Floor: - TCTS (cardiothoracic surgery) - AFib Clinic - Structural Heart Clinic - Vascular Surgery  - Vascular Ultrasound  5th Floor: - HeartCare Cardiology (general and EP) - Clinical Pharmacy for coumadin , hypertension, lipid, weight-loss medications, and med management appointments    Valet parking services will be available as well.

## 2024-01-22 LAB — BASIC METABOLIC PANEL WITH GFR
BUN/Creatinine Ratio: 10 (ref 10–24)
BUN: 11 mg/dL (ref 8–27)
CO2: 19 mmol/L — ABNORMAL LOW (ref 20–29)
Calcium: 9.3 mg/dL (ref 8.6–10.2)
Chloride: 100 mmol/L (ref 96–106)
Creatinine, Ser: 1.05 mg/dL (ref 0.76–1.27)
Glucose: 107 mg/dL — ABNORMAL HIGH (ref 70–99)
Potassium: 4.7 mmol/L (ref 3.5–5.2)
Sodium: 135 mmol/L (ref 134–144)
eGFR: 73 mL/min/{1.73_m2} (ref >59)

## 2024-01-22 LAB — CBC
Hematocrit: 40.2 % (ref 37.5–51.0)
Hemoglobin: 13.8 g/dL (ref 13.0–17.7)
MCH: 34.5 pg — ABNORMAL HIGH (ref 26.6–33.0)
MCHC: 34.3 g/dL (ref 31.5–35.7)
MCV: 101 fL — ABNORMAL HIGH (ref 79–97)
Platelets: 199 10*3/uL (ref 150–450)
RBC: 4 x10E6/uL — ABNORMAL LOW (ref 4.14–5.80)
RDW: 12 % (ref 11.6–15.4)
WBC: 4.7 10*3/uL (ref 3.4–10.8)

## 2024-01-28 ENCOUNTER — Ambulatory Visit: Payer: Medicare Other | Admitting: Physician Assistant

## 2024-02-03 NOTE — Progress Notes (Signed)
 Spoke to patient and instructed them to come at 0830  and to be NPO after 0000.  Medications reviewed.    Confirmed that patient will have a ride home and someone to stay with them for 24 hours after the procedure.

## 2024-02-03 NOTE — Anesthesia Preprocedure Evaluation (Signed)
 Anesthesia Evaluation  Patient identified by MRN, date of birth, ID band Patient awake    Reviewed: Allergy & Precautions, NPO status , Patient's Chart, lab work & pertinent test results  Airway Mallampati: III  TM Distance: >3 FB Neck ROM: Full    Dental no notable dental hx. (+) Teeth Intact, Dental Advisory Given   Pulmonary neg pulmonary ROS   Pulmonary exam normal breath sounds clear to auscultation       Cardiovascular hypertension, Pt. on medications Normal cardiovascular exam+ dysrhythmias Atrial Fibrillation + Valvular Problems/Murmurs (s/p MV repair 2014) MR  Rhythm:Irregular Rate:Normal  TEE 2025 1. Left ventricular ejection fraction, by estimation, is 40 to 45%. The  left ventricle has mildly decreased function.   2. Right ventricular systolic function is mildly reduced. The right  ventricular size is normal.   3. Left atrial size was severely dilated. No left atrial/left atrial  appendage thrombus was detected.   4. The mitral valve has been repaired/replaced. Trivial mitral valve  regurgitation. There is a 34 Mitral Memo 3D prosthetic annuloplasty ring  present in the mitral position. Procedure Date: 01/06/13.   5. The aortic valve is tricuspid. Aortic valve regurgitation is trivial.      Neuro/Psych negative neurological ROS  negative psych ROS   GI/Hepatic Neg liver ROS,GERD  ,,  Endo/Other  negative endocrine ROS    Renal/GU negative Renal ROS  negative genitourinary   Musculoskeletal negative musculoskeletal ROS (+)    Abdominal   Peds  Hematology  (+) Blood dyscrasia (eliquis )   Anesthesia Other Findings   Reproductive/Obstetrics                             Anesthesia Physical Anesthesia Plan  ASA: 3  Anesthesia Plan: General   Post-op Pain Management:    Induction: Intravenous  PONV Risk Score and Plan: Propofol  infusion and Treatment may vary due to age or  medical condition  Airway Management Planned: Natural Airway  Additional Equipment:   Intra-op Plan:   Post-operative Plan:   Informed Consent: I have reviewed the patients History and Physical, chart, labs and discussed the procedure including the risks, benefits and alternatives for the proposed anesthesia with the patient or authorized representative who has indicated his/her understanding and acceptance.     Dental advisory given  Plan Discussed with: [name]  Anesthesia Plan Comments:        Anesthesia Quick Evaluation

## 2024-02-04 ENCOUNTER — Encounter (HOSPITAL_COMMUNITY)
Admission: RE | Disposition: A | Discharge status: Home / Self Care | Payer: Self-pay | Source: Home / Self Care | Attending: Cardiology

## 2024-02-04 ENCOUNTER — Other Ambulatory Visit: Payer: Self-pay

## 2024-02-04 ENCOUNTER — Encounter (HOSPITAL_COMMUNITY): Payer: Self-pay | Admitting: Cardiology

## 2024-02-04 ENCOUNTER — Ambulatory Visit (HOSPITAL_COMMUNITY): Admitting: Anesthesiology

## 2024-02-04 ENCOUNTER — Ambulatory Visit (HOSPITAL_COMMUNITY)
Admission: RE | Admit: 2024-02-04 | Discharge: 2024-02-04 | Disposition: A | Discharge status: Home / Self Care | Attending: Cardiology | Admitting: Cardiology

## 2024-02-04 DIAGNOSIS — I4892 Unspecified atrial flutter: Secondary | ICD-10-CM

## 2024-02-04 DIAGNOSIS — K219 Gastro-esophageal reflux disease without esophagitis: Secondary | ICD-10-CM | POA: Diagnosis not present

## 2024-02-04 DIAGNOSIS — Z79899 Other long term (current) drug therapy: Secondary | ICD-10-CM | POA: Diagnosis not present

## 2024-02-04 DIAGNOSIS — E785 Hyperlipidemia, unspecified: Secondary | ICD-10-CM

## 2024-02-04 DIAGNOSIS — Z952 Presence of prosthetic heart valve: Secondary | ICD-10-CM | POA: Diagnosis not present

## 2024-02-04 DIAGNOSIS — I4819 Other persistent atrial fibrillation: Secondary | ICD-10-CM | POA: Diagnosis not present

## 2024-02-04 DIAGNOSIS — I483 Typical atrial flutter: Secondary | ICD-10-CM | POA: Diagnosis not present

## 2024-02-04 DIAGNOSIS — Z7901 Long term (current) use of anticoagulants: Secondary | ICD-10-CM | POA: Diagnosis not present

## 2024-02-04 DIAGNOSIS — I484 Atypical atrial flutter: Secondary | ICD-10-CM

## 2024-02-04 DIAGNOSIS — I10 Essential (primary) hypertension: Secondary | ICD-10-CM

## 2024-02-04 HISTORY — PX: CARDIOVERSION: EP1203

## 2024-02-04 SURGERY — CARDIOVERSION (CATH LAB)
Anesthesia: General

## 2024-02-04 MED ORDER — PROPOFOL 10 MG/ML IV BOLUS
INTRAVENOUS | Status: DC | PRN
  Administered 2024-02-04 (×2): 50 mg via INTRAVENOUS

## 2024-02-04 MED ORDER — SODIUM CHLORIDE 0.9 % IV SOLN: INTRAVENOUS | Status: DC

## 2024-02-04 MED ORDER — LIDOCAINE 2% (20 MG/ML) 5 ML SYRINGE
INTRAMUSCULAR | Status: DC | PRN
  Administered 2024-02-04: 80 mg via INTRAVENOUS

## 2024-02-04 SURGICAL SUPPLY — 1 items: PAD DEFIB RADIO PHYSIO CONN (PAD) ×1 IMPLANT

## 2024-02-04 NOTE — Transfer of Care (Signed)
 Immediate Anesthesia Transfer of Care Note  Patient: James [name]  Procedure(s) Performed: CARDIOVERSION  Patient Location: Cath Lab  Anesthesia Type:General  Level of Consciousness: awake, alert , and oriented  Airway & Oxygen Therapy: Patient Spontanous Breathing and Patient connected to nasal cannula oxygen  Post-op Assessment: Report given to RN and Post -op Vital signs reviewed and stable  Post vital signs: Reviewed and stable   Last Vitals:  Vitals Value Taken Time  BP 124/73 1045  Temp 97 1045  Pulse 59 1045  Resp 18 1045  SpO2 95 1045    Last Pain:  Vitals:   02/04/24 0855  TempSrc:   PainSc: 0-No pain         Complications: There were no known notable events for this encounter.

## 2024-02-04 NOTE — Interval H&P Note (Signed)
 History and Physical Interval Note:  02/04/2024 8:39 AM  James [name]  has presented today for surgery, with the diagnosis of persistent afib, aflutter.  The various methods of treatment have been discussed with the patient and family. After consideration of risks, benefits and other options for treatment, the patient has consented to  Procedure(s): CARDIOVERSION (N/A) as a surgical intervention.  The patient's history has been reviewed, patient examined, no change in status, stable for surgery.  I have reviewed the patient's chart and labs.  Questions were answered to the patient's satisfaction.    No skipped doses of Eliquis  - per patient.   Informed Consent   Shared Decision Making/Informed Consent The risks (stroke, cardiac arrhythmias rarely resulting in the need for a temporary or permanent pacemaker, skin irritation or burns and complications associated with conscious sedation including aspiration, arrhythmia, respiratory failure and death), benefits (restoration of normal sinus rhythm) and alternatives of a direct current cardioversion were explained in detail to James [name] and he agrees to proceed.       Contact person : wife    James [name]

## 2024-02-04 NOTE — Anesthesia Postprocedure Evaluation (Signed)
 Anesthesia Post Note  Patient: James [name]  Procedure(s) Performed: CARDIOVERSION     Patient location during evaluation: Cath Lab Anesthesia Type: General Level of consciousness: awake and alert Pain management: pain level controlled Vital Signs Assessment: post-procedure vital signs reviewed and stable Respiratory status: spontaneous breathing, nonlabored ventilation, respiratory function stable and patient connected to nasal cannula oxygen Cardiovascular status: blood pressure returned to baseline and stable Postop Assessment: no apparent nausea or vomiting Anesthetic complications: no  There were no known notable events for this encounter.  Last Vitals:  Vitals:   02/04/24 1055 02/04/24 1100  BP: 128/75 125/78  Pulse: (!) 59 62  Resp: 18 19  Temp:    SpO2: 99% 97%    Last Pain:  Vitals:   02/04/24 1115  TempSrc:   PainSc: 0-No pain                 Chalene Treu L Karolyna Bianchini

## 2024-02-04 NOTE — CV Procedure (Signed)
   DIRECT CURRENT CARDIOVERSION  NAME:  James [name]    MRN: [id number] DOB:  [birth year]    ADMIT DATE: 02/04/2024  Indication:  Symptomatic atrial flutter  Procedure Note:  The patient signed informed consent.  They have had had therapeutic anticoagulation with Eliquis  greater than 3 weeks.  Anesthesia was administered by Dr. Gail Joseph.  Adequate airway was maintained throughout and vital followed per protocol.  They were cardioverted x 1 with 200J of biphasic synchronized energy.  They converted to sinus bradycardia.  There were no apparent complications.  The patient had normal neuro status and respiratory status post procedure with vitals stable as recorded elsewhere.    Wife updated post-procedure.   Follow up: They will continue on current medical therapy and follow up with cardiology as scheduled.  Marina Desire, DO, [facility]  [facility]  [phone] 10:48 AM

## 2024-02-05 ENCOUNTER — Encounter (HOSPITAL_COMMUNITY): Payer: Self-pay | Admitting: Cardiology

## 2024-02-13 ENCOUNTER — Ambulatory Visit: Admitting: Student

## 2024-02-17 ENCOUNTER — Telehealth: Payer: Self-pay | Admitting: Cardiovascular Disease

## 2024-02-17 NOTE — Telephone Encounter (Signed)
 Pt would like to know if he should be trying anything different after having a cardioversion to manage his pulse rate until upcoming appt. Please advise

## 2024-02-17 NOTE — Telephone Encounter (Signed)
 Spoke with pt who reports his HR has been mostly in the 50's since his last DCCV.  Pt reports it has been as low as 48.  Pt denies current CP, SOB or dizziness.  Pt does not have a current BP.  He states his Kardia mobile shows NSR with HR of 58.  Pt continues medications as prescribed. Pt advised normal HR 60-100 and HR after last 2 cardioversion's was 63 and 48 respectively. Offered reassurance as he is currently asymptomatic.  Will forward to Dr Arlester Ladd for further review.  Pt verbalizes understanding and agrees with current plan.

## 2024-02-24 ENCOUNTER — Telehealth: Payer: Self-pay | Admitting: Student

## 2024-02-24 MED ORDER — AMIODARONE HCL 200 MG PO TABS: 200.0000 mg | ORAL_TABLET | Freq: Every day | ORAL | Status: DC

## 2024-02-24 NOTE — Telephone Encounter (Signed)
 Spoke with pt, he had a DCCV 2 weeks ago and his heart rate has been running 50-60 bpm since then. For the last 2 days his apple watch has alerted him that his heart rate is 35-40 bpm. He does feel lightheaded and tired today. Patient advised to reduce the amiodarone  to 200 mg just once daily. He has a follow up appointment 03/09/24. Will make dr Arlester Ladd aware and call the patient back with any more recommendations.

## 2024-02-24 NOTE — Telephone Encounter (Signed)
 STAT if HR is under 50 or over 120 (normal HR is 60-100 beats per minute)  What is your heart rate? 38  Do you have a log of your heart rate readings (document readings)? 35, 40  Do you have any other symptoms? Fatigue, lightheaded

## 2024-02-25 DIAGNOSIS — R350 Frequency of micturition: Secondary | ICD-10-CM | POA: Diagnosis not present

## 2024-02-25 DIAGNOSIS — R001 Bradycardia, unspecified: Secondary | ICD-10-CM | POA: Diagnosis not present

## 2024-02-25 NOTE — Telephone Encounter (Signed)
 Attempted to contact patient to follow up, left message to call our office back if needed. Dr Arlester Ladd agrees with medication changes (decrease amiodarone  to 200 mg once daily). Appointment already scheduled on 03/09/24 with Michaelle Adolphus, PA.

## 2024-03-01 DIAGNOSIS — R350 Frequency of micturition: Secondary | ICD-10-CM | POA: Diagnosis not present

## 2024-03-05 ENCOUNTER — Encounter (HOSPITAL_BASED_OUTPATIENT_CLINIC_OR_DEPARTMENT_OTHER): Payer: Self-pay

## 2024-03-09 ENCOUNTER — Other Ambulatory Visit (HOSPITAL_BASED_OUTPATIENT_CLINIC_OR_DEPARTMENT_OTHER): Payer: Self-pay | Admitting: Physician Assistant

## 2024-03-09 ENCOUNTER — Encounter: Payer: Self-pay | Admitting: Student

## 2024-03-09 ENCOUNTER — Ambulatory Visit: Attending: Student | Admitting: Student

## 2024-03-09 ENCOUNTER — Other Ambulatory Visit: Payer: Self-pay | Admitting: Physician Assistant

## 2024-03-09 VITALS — BP 164/83 | HR 56 | Ht 71.0 in | Wt 197.0 lb

## 2024-03-09 DIAGNOSIS — E871 Hypo-osmolality and hyponatremia: Secondary | ICD-10-CM

## 2024-03-09 DIAGNOSIS — I10 Essential (primary) hypertension: Secondary | ICD-10-CM | POA: Diagnosis not present

## 2024-03-09 DIAGNOSIS — I4892 Unspecified atrial flutter: Secondary | ICD-10-CM

## 2024-03-09 DIAGNOSIS — Z79899 Other long term (current) drug therapy: Secondary | ICD-10-CM | POA: Diagnosis not present

## 2024-03-09 DIAGNOSIS — Z5181 Encounter for therapeutic drug level monitoring: Secondary | ICD-10-CM | POA: Diagnosis not present

## 2024-03-09 DIAGNOSIS — I480 Paroxysmal atrial fibrillation: Secondary | ICD-10-CM | POA: Diagnosis not present

## 2024-03-09 MED ORDER — AMIODARONE HCL 200 MG PO TABS: 200.0000 mg | ORAL_TABLET | Freq: Every day | ORAL | 3 refills | Status: DC

## 2024-03-09 NOTE — Patient Instructions (Signed)
 Medication Instructions:  Decrease amiodarone  to 200 mg daily    *If you need a refill on your cardiac medications before your next appointment, please call your pharmacy*  Lab Work: No lab work today If you have labs (blood work) drawn today and your tests are completely normal, you will receive your results only by: MyChart Message (if you have MyChart) OR A paper copy in the mail If you have any lab test that is abnormal or we need to change your treatment, we will call you to review the results.  Testing/Procedures: No testing/procedures were scheduled today  Follow-Up: At [facility], you and your health needs are our priority.  As part of our continuing mission to provide you with exceptional heart care, our providers are all part of one team.  This team includes your primary Cardiologist (physician) and Advanced Practice Providers or APPs (Physician Assistants and Nurse Practitioners) who all work together to provide you with the care you need, when you need it.  Your next appointment:   6 month(s)  Provider:   You may see Efraim Grange, MD or one of the following Advanced Practice Providers on your designated Care Team:   Mertha Abrahams, [place] [address] [place], PA-C Suzann Riddle, NP Creighton Doffing, NP    We recommend signing up for the patient portal called "MyChart".  Sign up information is provided on this After Visit Summary.  MyChart is used to connect with patients for Virtual Visits (Telemedicine).  Patients are able to view lab/test results, encounter notes, upcoming appointments, etc.  Non-urgent messages can be sent to your provider as well.   To learn more about what you can do with MyChart, go to [email or web address].

## 2024-03-09 NOTE — Progress Notes (Addendum)
  Electrophysiology Office Note:   Date:  03/09/2024  ID:  James [name], DOB 1946/09/17, MRN [id number]  Primary Cardiologist: Dorothye Gathers, MD Electrophysiologist: Efraim Grange, MD      History of Present Illness:   James [name] is a 78 y.o. male with h/o MVR in 2014 and atrial flutter (s/p ablation by Dr. Nunzio Belch in 2018, multiple cardioversions, repeat ablation with Dr. Arlester Ladd 2025), hypertension, hyperlipidemia seen today for routine electrophysiology follow-up s/p cardioversion.  Since last being seen in our clinic the patient reports doing well. Noted low nocturnal HRs on apple watch. Reassurance given. Has had some on-going fatigue, but hasn't picked his activity back up now s/p ablation. Otherwise, he denies chest pain, palpitations, dyspnea, PND, orthopnea, nausea, vomiting, dizziness, syncope, edema, weight gain, or early satiety.    Review of systems complete and found to be negative unless listed in HPI.   EP Information / Studies Reviewed:    EKG is ordered today. Personal review as below.  EKG Interpretation Date/Time:  Tuesday March 09 2024 10:39:33 EDT Ventricular Rate:  56 PR Interval:  312 QRS Duration:  100 QT Interval:  476 QTC Calculation: 459 R Axis:   -13  Text Interpretation: Sinus bradycardia with 1st degree A-V block Septal infarct (cited on or before 13-Jan-2024) When compared with ECG of 04-Feb-2024 10:51, Nonspecific T wave abnormality no longer evident in Anterior leads Confirmed by Pilar Bridge [id number]) on 03/09/2024 10:43:46 AM    Arrhythmia/Device History No specialty comments available.   Physical Exam:   VS:  BP (!) 164/83   Pulse (!) 56   Ht 5' 11 (1.803 m)   Wt 197 lb (89.4 kg)   SpO2 95%   BMI 27.48 kg/m    Wt Readings from Last 3 Encounters:  03/09/24 197 lb (89.4 kg)  02/04/24 190 lb (86.2 kg)  01/21/24 194 lb 12.8 oz (88.4 kg)     GEN: No acute distress NECK: No JVD; No carotid bruits CARDIAC: Regular rate and rhythm, no  murmurs, rubs, gallops RESPIRATORY:  Clear to auscultation without rales, wheezing or rhonchi  ABDOMEN: Soft, non-tender, non-distended EXTREMITIES:  No edema; No deformity   ASSESSMENT AND PLAN:    Persistent atrial fibrillation Persistent atrial flutter EKG today shows sinus bradycardia.  S/p flutter ablation 2018 by Dr. Nunzio Belch S/p AF ablation on 10/31/2023 by Dr. Arlester Ladd. Decrease amiodarone  to 200 mg daily.   Follow up with EP Team in 6 months  Signed, Tylene Galla, PA-C

## 2024-03-15 ENCOUNTER — Telehealth: Payer: Self-pay | Admitting: Cardiology

## 2024-03-15 NOTE — Telephone Encounter (Signed)
 Returned call, spoke to patient and spouse who state they continue to have concerns about low heart rate that they discussed with Michaelle Adolphus on visit 03/09/24. They state that they have noticed that patient has been fatigued and sleeping a lot. They note that when he is sitting/lying down his HR is as low as 36, on exertion such as walking around the house his HR is 55. He also reports lightheadedness on standing/rising that passes quickly. He states this occurs 2-3 times a day. I had patient check HR and BP while on the phone, he reports HR is 52 and BP is 153/81.   Patient also reports that he feels that fatigue and sleeping a lot may be more than physical symptoms and that he may be experiencing depression. He states he has had to be on antidepressants previously when experiencing the loss of a family member and he feels run down in a similar way to that. Advised patient to make appointment with PCP to discuss feelings of sadness, loss of interest in life, restarting antidepressants. Patient states, I would like to know from Dr. Renna Cary and Dr. Arlester Ladd if it could be my medications or my low heart rate contributing to my feelings of being fatigued and sleepy all the time. Advised patient to go to ED if he experiences syncope or if he has any concerns for self-harm or suicidal ideation. Patient verbalizes understanding. Forwarded patient questions to MD.

## 2024-03-15 NOTE — Telephone Encounter (Signed)
 Pt states he's still having the same symptoms he had at his last appt I tried to ask more questions but he would like the nurse to call back. Please advise

## 2024-03-16 NOTE — Addendum Note (Signed)
 Addended by: Bayron Dalto L on: 03/16/2024 01:39 PM   Modules accepted: Orders

## 2024-03-16 NOTE — Telephone Encounter (Signed)
 Please stop amiodarone  per Dr. Arlester Ladd above.   He can call for a closer visit if he continues to have bradycardia OR starts to have more AF/AFL.   Agree with seeing PCP re: mood.    Went over the information above with the patient. He verbalized understanding. Has an appt with PCP in a few weeks.

## 2024-03-16 NOTE — Telephone Encounter (Signed)
 Please stop amiodarone  per Dr. Arlester Ladd above.   He can call for a closer visit if he continues to have bradycardia OR starts to have more AF/AFL.  Agree with seeing PCP re: mood.

## 2024-03-24 ENCOUNTER — Other Ambulatory Visit

## 2024-04-08 DIAGNOSIS — R3912 Poor urinary stream: Secondary | ICD-10-CM | POA: Diagnosis not present

## 2024-04-08 DIAGNOSIS — R350 Frequency of micturition: Secondary | ICD-10-CM | POA: Diagnosis not present

## 2024-04-14 ENCOUNTER — Telehealth: Payer: Self-pay | Admitting: Cardiology

## 2024-04-14 NOTE — Telephone Encounter (Signed)
 Mealor, Eulas BRAVO, MD  04/14/24  3:54 PM Agreed. Thx   Jeffrie Oneil BROCKS, MD    04/14/24  3:41 PM OK to continue to hold the amiodarone  and metoprolol .  Let's have him come in for nurse visit or afib clinic for ECG.  Thanks Oneil Jeffrie, MD  Called the patient and let him know.  I scheduled him for nurse visit for 04/20/24.  He is out of town until Tuesday or he would come sooner.   Pt grateful for assistance.

## 2024-04-14 NOTE — Telephone Encounter (Signed)
 Left message for patient to call back

## 2024-04-14 NOTE — Telephone Encounter (Signed)
 Pt c/o medication issue:  1. Name of Medication: amiodarone  (PACERONE ) 200 MG tablet  metoprolol  succinate (TOPROL -XL) 100 MG 24 hr tablet   2. How are you currently taking this medication (dosage and times per day)?   3. Are you having a reaction (difficulty breathing--STAT)? No  4. What is your medication issue? Pt is requesting a callback regarding him wanting to know if he should start these medications again or at least one of them for his HR. Please advise

## 2024-04-14 NOTE — Telephone Encounter (Signed)
 For about 1 month post ablation HR stayed in low 40s.  Was still on amiodarone , had prev stopped metoprolol .  Stopped amio 10-14 days ago and 3-4 days ago HR popped to low-mid 90s.  Still in rhythm per Apple Watch.   Most recent dose of amio was 200 mg daily. Prior dose of Toprol  looks to be 100 mg daily.    Has not checked BP but reports feels fine.  Last check was around 120/80.     Aware I will forward to Dr. [name] and to Dr. Jeffrie for recommendations and then we will give him a call back.

## 2024-04-20 ENCOUNTER — Ambulatory Visit: Attending: Cardiology

## 2024-04-20 VITALS — BP 106/58 | HR 95 | Ht 71.0 in | Wt 194.0 lb

## 2024-04-20 DIAGNOSIS — E871 Hypo-osmolality and hyponatremia: Secondary | ICD-10-CM | POA: Diagnosis not present

## 2024-04-20 DIAGNOSIS — I4892 Unspecified atrial flutter: Secondary | ICD-10-CM

## 2024-04-20 DIAGNOSIS — D6869 Other thrombophilia: Secondary | ICD-10-CM | POA: Diagnosis not present

## 2024-04-20 DIAGNOSIS — I483 Typical atrial flutter: Secondary | ICD-10-CM | POA: Diagnosis not present

## 2024-04-20 DIAGNOSIS — I10 Essential (primary) hypertension: Secondary | ICD-10-CM | POA: Diagnosis not present

## 2024-04-20 NOTE — Patient Instructions (Signed)
 Medication Instructions:  Your physician recommends that you continue on your current medications as directed. Please refer to the Current Medication list given to you today.  *If you need a refill on your cardiac medications before your next appointment, please call your pharmacy*  Lab Work: If you have labs (blood work) drawn today and your tests are completely normal, you will receive your results only by: MyChart Message (if you have MyChart) OR A paper copy in the mail If you have any lab test that is abnormal or we need to change your treatment, we will call you to review the results.  Testing/Procedures: None ordered today.  Follow-Up: At [facility], you and your health needs are our priority.  As part of our continuing mission to provide you with exceptional heart care, our providers are all part of one team.  This team includes your primary Cardiologist (physician) and Advanced Practice Providers or APPs (Physician Assistants and Nurse Practitioners) who all work together to provide you with the care you need, when you need it.  Your next appointment:   In December (please call to schedule)  Provider:   You may see [name], MD or one of the following Advanced Practice Providers on your designated Care Team:   Charlies Arthur, PA-C Michael Andy Tillery, PA-C Suzann Riddle, NP Daphne Barrack, NP    We recommend signing up for the patient portal called MyChart.  Sign up information is provided on this After Visit Summary.  MyChart is used to connect with patients for Virtual Visits (Telemedicine).  Patients are able to view lab/test results, encounter notes, upcoming appointments, etc.  Non-urgent messages can be sent to your provider as well.   To learn more about what you can do with MyChart, go to [email or web address].

## 2024-04-20 NOTE — Progress Notes (Unsigned)
   Nurse Visit   Date of Encounter: 04/20/2024 ID: James [name], DOB Feb 08, 1946, MRN [id number]  PCP:  [name] LABOR, MD    [facility] Providers Cardiologist:  [name], MD Electrophysiologist:  [name], MD { Click to update primary MD,subspecialty MD or APP then REFRESH:1}     Visit Details   VS:  BP (!) 106/58 (BP Location: Right Arm, Patient Position: Sitting, Cuff Size: Normal)   Pulse 95   Ht 5' 11 (1.803 m)   Wt 194 lb (88 kg)   SpO2 98%   BMI 27.06 kg/m  , BMI Body mass index is 27.06 kg/m.  Wt Readings from Last 3 Encounters:  04/20/24 194 lb (88 kg)  03/09/24 197 lb (89.4 kg)  02/04/24 190 lb (86.2 kg)     Reason for visit: EKG Performed today: Vitals, EKG, Provider consulted:Dr. Parchment, and Education Changes (medications, testing, etc.) : No Changes, Will have Dr. Furbish review. Length of Visit: 10 minutes    Medications Adjustments/Labs and Tests Ordered: Orders Placed This Encounter  Procedures   EKG 12-Lead   No orders of the defined types were placed in this encounter.    Signed, Sharlet Drena Martinis, RN  04/20/2024 3:39 PM

## 2024-04-27 NOTE — Progress Notes (Unsigned)
  Electrophysiology Office Note:    Date:  04/27/2024   ID:  James [name], DOB 10-07-1945, MRN [id number]  PCP:  [name] LABOR, MD   [facility] Providers Cardiologist:  [name], MD Electrophysiologist:  [name], MD     Referring MD: [name] LABOR, *   History of Present Illness:    James [name] is a 78 y.o. male with a hx of Mitral valve repair in 2014significant for atrial fibrillation, referred for arrhythmia management.  He has been followed by Dr. [name] in the past and underwent atrial flutter ablation in 2018.  He was noted by his PCP to have an irregular rhythm and referred back for management of atrial flutter.  He has been managed in atrial fibrillation clinic.  He underwent a cardioversion in January.  In follow-up in May 2024 he was noted to be in atrial flutter and underwent DC cardioversion subsequently.  He has not had any symptoms with these atrial fibrillation episodes.  He underwent DC cardioversions in October 2024 and again in December 2024.  He has had recurrence of atrial fibrillation within a short time.  He was given amiodarone  prior to the most recent cardioversion.  He underwent an ablation for atrial fibrillation on October 31, 2023.  The pulmonary veins and posterior wall were abated with pulsed field energy.  Cardioversion was required during the procedure, and the patient converted to a junctional rhythm.    He subsequently developed an atypical atrial flutter and has undergone cardioversion again in April and May 2025.  In June to 2025, amiodarone  was decreased to 100 mg daily.  His atypical flutter subsequently recurred.   Current Medications: No outpatient medications have been marked as taking for the 04/28/24 encounter (Appointment) with [name], Eulas FORBES, MD.      EKGs/Labs/Other Studies Reviewed Today:    Echocardiogram:  TTE 02/18/2023 LVEF 60-65%, moderate LVH. Severely dilated left atrium. S/p MVR  with mild mitral stenosis   Monitors:   Stress testing:   Advanced imaging:   Cardiac catherization   EKG:  Last EKG results: today - sinus rhythm with first degree AV block    Physical Exam:    VS:  There were no vitals taken for this visit.    Wt Readings from Last 3 Encounters:  04/20/24 194 lb (88 kg)  03/09/24 197 lb (89.4 kg)  02/04/24 190 lb (86.2 kg)     GEN: Well nourished, well developed in no acute distress CARDIAC: RRR, no murmurs, rubs, gallops RESPIRATORY:  Normal work of breathing MUSCULOSKELETAL: no edema    ASSESSMENT & PLAN:    Persistent atrial fibrillation Mild, if any symptoms Now with recurrence of persistent A-fib and early recurrence after cardioversion Continue apixaban  for now due to ease of periprocedural management; I think he will need to switch to warfarin given history of mitral stenosis.   Atypical atrial flutter Suspect possible mitral dependent flutter after PFA of pulmonary veins and posterior wall of the left atrium ***  Sinus bradycardia Caution with antichronotropic medications  Secondary hypercoagulable state Continue apixaban  5 mg  S/P mitral valve repair for mitral stenosis Mild MS      Signed, [name], MD  04/27/2024 5:05 PM    [facility]

## 2024-04-27 NOTE — H&P (View-Only) (Signed)
  Electrophysiology Office Note:    Date:  04/27/2024   ID:  James [name], DOB 10-07-1945, MRN [id number]  PCP:  [name] LABOR, MD   [facility] Providers Cardiologist:  [name], MD Electrophysiologist:  [name], MD     Referring MD: [name] LABOR, *   History of Present Illness:    James [name] is a 78 y.o. male with a hx of Mitral valve repair in 2014significant for atrial fibrillation, referred for arrhythmia management.  He has been followed by Dr. [name] in the past and underwent atrial flutter ablation in 2018.  He was noted by his PCP to have an irregular rhythm and referred back for management of atrial flutter.  He has been managed in atrial fibrillation clinic.  He underwent a cardioversion in January.  In follow-up in May 2024 he was noted to be in atrial flutter and underwent DC cardioversion subsequently.  He has not had any symptoms with these atrial fibrillation episodes.  He underwent DC cardioversions in October 2024 and again in December 2024.  He has had recurrence of atrial fibrillation within a short time.  He was given amiodarone  prior to the most recent cardioversion.  He underwent an ablation for atrial fibrillation on October 31, 2023.  The pulmonary veins and posterior wall were abated with pulsed field energy.  Cardioversion was required during the procedure, and the patient converted to a junctional rhythm.    He subsequently developed an atypical atrial flutter and has undergone cardioversion again in April and May 2025.  In June to 2025, amiodarone  was decreased to 100 mg daily.  His atypical flutter subsequently recurred.   Current Medications: No outpatient medications have been marked as taking for the 04/28/24 encounter (Appointment) with [name], Eulas FORBES, MD.      EKGs/Labs/Other Studies Reviewed Today:    Echocardiogram:  TTE 02/18/2023 LVEF 60-65%, moderate LVH. Severely dilated left atrium. S/p MVR  with mild mitral stenosis   Monitors:   Stress testing:   Advanced imaging:   Cardiac catherization   EKG:  Last EKG results: today - sinus rhythm with first degree AV block    Physical Exam:    VS:  There were no vitals taken for this visit.    Wt Readings from Last 3 Encounters:  04/20/24 194 lb (88 kg)  03/09/24 197 lb (89.4 kg)  02/04/24 190 lb (86.2 kg)     GEN: Well nourished, well developed in no acute distress CARDIAC: RRR, no murmurs, rubs, gallops RESPIRATORY:  Normal work of breathing MUSCULOSKELETAL: no edema    ASSESSMENT & PLAN:    Persistent atrial fibrillation Mild, if any symptoms Now with recurrence of persistent A-fib and early recurrence after cardioversion Continue apixaban  for now due to ease of periprocedural management; I think he will need to switch to warfarin given history of mitral stenosis.   Atypical atrial flutter Suspect possible mitral dependent flutter after PFA of pulmonary veins and posterior wall of the left atrium ***  Sinus bradycardia Caution with antichronotropic medications  Secondary hypercoagulable state Continue apixaban  5 mg  S/P mitral valve repair for mitral stenosis Mild MS      Signed, [name], MD  04/27/2024 5:05 PM    [facility]

## 2024-04-28 ENCOUNTER — Ambulatory Visit: Attending: Cardiology | Admitting: Cardiovascular Disease

## 2024-04-28 VITALS — BP 98/50 | HR 100 | Ht 71.0 in | Wt 199.5 lb

## 2024-04-28 DIAGNOSIS — I4892 Unspecified atrial flutter: Secondary | ICD-10-CM | POA: Diagnosis not present

## 2024-04-28 DIAGNOSIS — I4819 Other persistent atrial fibrillation: Secondary | ICD-10-CM | POA: Diagnosis not present

## 2024-04-28 DIAGNOSIS — I484 Atypical atrial flutter: Secondary | ICD-10-CM | POA: Diagnosis not present

## 2024-04-28 LAB — BASIC METABOLIC PANEL WITH GFR
BUN/Creatinine Ratio: 13 (ref 10–24)
BUN: 13 mg/dL (ref 8–27)
CO2: 18 mmol/L — ABNORMAL LOW (ref 20–29)
Calcium: 8.8 mg/dL (ref 8.6–10.2)
Chloride: 103 mmol/L (ref 96–106)
Creatinine, Ser: 1 mg/dL (ref 0.76–1.27)
Glucose: 96 mg/dL (ref 70–99)
Potassium: 4.8 mmol/L (ref 3.5–5.2)
Sodium: 136 mmol/L (ref 134–144)
eGFR: 78 mL/min/1.73 (ref >59)

## 2024-04-28 NOTE — Patient Instructions (Addendum)
 Medication Instructions:  Your physician recommends that you continue on your current medications as directed. Please refer to the Current Medication list given to you today.  *If you need a refill on your cardiac medications before your next appointment, please call your pharmacy*  Lab Work: CBC and BMET - please have pre-procedure lab work completed on today . This can be done at ANY [facility] near you - no appointment required and this does not have to be fasting. If you have labs (blood work) drawn today and your tests are completely normal, you will receive your results only by: MyChart Message (if you have MyChart) OR A paper copy in the mail If you have any lab test that is abnormal or we need to change your treatment, we will call you to review the results.  Testing/Procedures: None ordered.    Atypical Aflutter Ablation Ablation - scheduled on Monday 05/17/2024 We will be in contact closer to your ablation date with further instructions Your physician has recommended that you have an ablation. Catheter ablation is a medical procedure used to treat some cardiac arrhythmias (irregular heartbeats). During catheter ablation, a long, thin, flexible tube is put into a blood vessel in your groin (upper thigh), or neck. This tube is called an ablation catheter. It is then guided to your heart through the blood vessel. Radio frequency waves destroy small areas of heart tissue where abnormal heartbeats may cause an arrhythmia to start. Please see the instruction sheet given to you today.  Follow-Up: At [facility], you and your health needs are our priority.  As part of our continuing mission to provide you with exceptional heart care, our providers are all part of one team.  This team includes your primary Cardiologist (physician) and Advanced Practice Providers or APPs (Physician Assistants and Nurse Practitioners) who all work together to provide you with the care you need, when you  need it.  Your next appointment:   We will schedule follow up after your ablation  Provider:   Dr [name]   Cardiac Ablation Cardiac ablation is a procedure to destroy, or ablate, a small amount of heart tissue that is causing problems. The heart has many electrical connections. Sometimes, these connections are abnormal and can cause the heart to beat very fast or irregularly. Ablating the abnormal areas can improve the heart's rhythm or return it to normal. Ablation may be done for people who: Have irregular or rapid heartbeats (arrhythmias). Have Wolff-Parkinson-White syndrome. Have taken medicines for an arrhythmia that did not work or caused side effects. Have a high-risk heartbeat that may be life-threatening. Tell a health care provider about: Any allergies you have. All medicines you are taking, including vitamins, herbs, eye drops, creams, and over-the-counter medicines. Any problems you or family members have had with anesthesia. Any bleeding problems you have. Any surgeries you have had. Any medical conditions you have. Whether you are pregnant or may be pregnant. What are the risks? Your health care provider will talk with you about risks. These may include: Infection. Bruising and bleeding. Stroke or blood clots. Damage to nearby structures or organs. Allergic reaction to medicines or dyes. Needing a pacemaker if the heart gets damaged. A pacemaker is a device that helps the heart beat normally. Failure of the procedure. A repeat procedure may be needed. What happens before the procedure? Medicines Ask your health care provider about: Changing or stopping your regular medicines. These include any heart rhythm medicines, diabetes medicines, or blood thinners you take.  Taking medicines such as aspirin  and ibuprofen. These medicines can thin your blood. Do not take them unless your health care provider tells you to. Taking over-the-counter medicines, vitamins, herbs, and  supplements. General instructions Follow instructions from your health care provider about what you may eat and drink. If you will be going home right after the procedure, plan to have a responsible adult: Take you home from the hospital or clinic. You will not be allowed to drive. Care for you for the time you are told. Ask your health care provider what steps will be taken to prevent infection. What happens during the procedure?  An IV will be inserted into one of your veins. You may be given: A sedative. This helps you relax. Anesthesia. This will: Numb certain areas of your body. An incision will be made in your neck or your groin. A needle will be inserted through the incision and into a large vein in your neck or groin. The small, thin tube (catheter) will be inserted through the needle and moved to your heart. A type of X-ray (fluoroscopy) will be used to help guide the catheter and provide images of the heart on a monitor. Dye may be injected through the catheter to help your surgeon see the area of the heart that needs treatment. Electrical currents will be sent from the catheter to destroy heart tissue in certain areas. There are three types of energy that may be used to do this: Heat (radiofrequency energy). Laser energy. Extreme cold (cryoablation). When the tissue has been destroyed, the catheter will be removed. Pressure will be held on the insertion area to prevent bleeding. A bandage (dressing) will be placed over the insertion area. The procedure may vary among health care providers and hospitals. What happens after the procedure? Your blood pressure, heart rate and rhythm, breathing rate, and blood oxygen level will be monitored until you leave the hospital or clinic. Your insertion area will be checked for bleeding. You will need to lie still for a few hours. If your groin was used, you will need to keep your leg straight for a few hours after the catheter is  removed. This information is not intended to replace advice given to you by your health care provider. Make sure you discuss any questions you have with your health care provider. Document Revised: 03/05/2022 Document Reviewed: 03/05/2022 Elsevier Patient Education  2024 [employer]vier [employer]       Electrophysiology/Ablation Procedure Instructions   James [name]  04/28/2024  You are scheduled for Atrial Fibrillation Ablation on Monday, August 18 with Dr. Eulas Furbish.  1. Pre procedure Lab testing: today  2. Please arrive at the Main Entrance A at [facility]: [address] [place], [place] [address] on August 18 at 10:30 AM (This time is two hours before your procedure to ensure your preparation). Free valet parking service is available. You will check in at ADMITTING. The support person will be asked to wait in the waiting room.  It is OK to have someone drop you off and come back when you are ready to be discharged.        Special note: Every effort is made to have your procedure done on time. Please understand that emergencies sometimes delay scheduled procedures.    3.  Do not eat or drink after midnight prior to your procedure.     4.  On the morning of your procedure Do NOT take any medication.      !!IF  ANY NEW MEDICATIONS ARE STARTED AFTER TODAY, PLEASE NOTIFY YOUR PROVIDER AS SOON AS POSSIBLE!!     6.   Plan to go home the same day, you will only stay overnight if medically necessary. 7.   You MUST have a responsible adult to drive you home. 8.   An adult MUST be with you the first 24 hours after you arrive home. 9    Bring a current list of your medications, and the last time and date medication taken. 10  Bring ID and current insurance cards. 11. Please wear clothes that are easy to get on and off and wear slip-on shoes.   Follow-up: You will follow up with the Afib clinic 4 weeks after your procedure and follow up with Dr.Augustus Mealor 3 months after your  procedure.   * If you have ANY questions after you get home, please call the office [phone] and ask for EP RN or send a MyChart message.  FYI: For your safety, and to allow us  to monitor your vital signs accurately during the surgery/procedure we request that if you have artificial nails, gel coating, SNS etc. Please have those removed prior to your surgery/procedure. Not having the nail coverings /polish removed may result in cancellation or delay of your surgery/procedure.

## 2024-04-28 NOTE — Addendum Note (Signed)
 Addended by: CASIMIR ALDONA BRAVO on: 04/28/2024 07:08 PM   Modules accepted: Orders

## 2024-04-29 ENCOUNTER — Ambulatory Visit: Payer: Self-pay

## 2024-04-29 LAB — CBC
Hematocrit: 38.2 % (ref 37.5–51.0)
Hemoglobin: 13 g/dL (ref 13.0–17.7)
MCH: 34.9 pg — ABNORMAL HIGH (ref 26.6–33.0)
MCHC: 34 g/dL (ref 31.5–35.7)
MCV: 102 fL — ABNORMAL HIGH (ref 79–97)
Platelets: 179 x10E3/uL (ref 150–450)
RBC: 3.73 x10E6/uL — ABNORMAL LOW (ref 4.14–5.80)
RDW: 12.9 % (ref 11.6–15.4)
WBC: 3.9 x10E3/uL (ref 3.4–10.8)

## 2024-05-10 ENCOUNTER — Telehealth (HOSPITAL_COMMUNITY): Payer: Self-pay

## 2024-05-10 NOTE — Telephone Encounter (Signed)
 Attempted to reach patient to discuss upcoming procedure, no answer. Left VM for patient to return call.

## 2024-05-11 NOTE — Telephone Encounter (Signed)
 Patient returned call to discuss upcoming procedure.   Labs: completed.   Any recent signs of acute illness or been started on antibiotics? No Any new medications started? No Any medications to hold? No Any missed doses of blood thinner?  No Advised patient to continue taking ANTICOAGULANT: Eliquis  (Apixaban ) twice daily without missing any doses.  Medication instructions:  On the morning of your procedure DO NOT take any medication., including Eliquis  or the procedure may be rescheduled. Nothing to eat or drink after midnight prior to your procedure.  Confirmed patient is scheduled for Atrial Flutter Ablation on Monday, August 18 with Dr. Eulas Furbish. Instructed patient to arrive at the Main Entrance A at [facility]: [address] [place], [place] [address] and check in at Admitting at 10:30 AM.  Advised of plan to go home the same day and will only stay overnight if medically necessary. You MUST have a responsible adult to drive you home and MUST be with you the first 24 hours after you arrive home or your procedure could be cancelled.  Patient verbalized understanding to all instructions provided and agreed to proceed with procedure.

## 2024-05-16 NOTE — Anesthesia Preprocedure Evaluation (Addendum)
 Anesthesia Evaluation  Patient identified by MRN, date of birth, ID band Patient awake    Reviewed: Allergy & Precautions, NPO status , Patient's Chart, lab work & pertinent test results  Airway Mallampati: IV  TM Distance: >3 FB Neck ROM: Full  Mouth opening: Limited Mouth Opening Comment: Small mouth opening Dental  (+) Teeth Intact, Dental Advisory Given   Pulmonary  Denies snoring   Pulmonary exam normal breath sounds clear to auscultation       Cardiovascular hypertension (107/75 preop), Pt. on medications +CHF (LVEF 40-45%, mild reduction in RV function)  + dysrhythmias (eliquis , s/p ablations 2018 and 2025) Atrial Fibrillation (-) pacemaker+ Valvular Problems/Murmurs (s/p MVR 2014)  Rhythm:Irregular Rate:Normal  10/2023 TEE  1. Left ventricular ejection fraction, by estimation, is 40 to 45%. The  left ventricle has mildly decreased function.   2. Right ventricular systolic function is mildly reduced. The right  ventricular size is normal.   3. Left atrial size was severely dilated. No left atrial/left atrial  appendage thrombus was detected.   4. The mitral valve has been repaired/replaced. Trivial mitral valve  regurgitation. There is a 34 Mitral Memo 3D prosthetic annuloplasty ring  present in the mitral position. Procedure Date: 01/06/13.   5. The aortic valve is tricuspid. Aortic valve regurgitation is trivial.     Neuro/Psych negative neurological ROS  negative psych ROS   GI/Hepatic Neg liver ROS,GERD  Medicated and Controlled,,  Endo/Other  negative endocrine ROS    Renal/GU Lab Results      Component                Value               Date                         K                        4.8                 04/28/2024                CO2                      18 (L)              04/28/2024                BUN                      13                  04/28/2024                CREATININE               1.00                 04/28/2024               negative genitourinary   Musculoskeletal negative musculoskeletal ROS (+)    Abdominal   Peds  Hematology Eliquis  Lab Results      Component                Value               Date  WBC                      3.9                 04/28/2024                HGB                      13.0                04/28/2024                HCT                      38.2                04/28/2024                MCV                      102 (H)             04/28/2024                PLT                      179                 04/28/2024              Anesthesia Other Findings Joo:pnipwz contrast media  Reproductive/Obstetrics negative OB ROS                              Anesthesia Physical Anesthesia Plan  ASA: 3  Anesthesia Plan: General   Post-op Pain Management: Minimal or no pain anticipated   Induction: Intravenous  PONV Risk Score and Plan: 3 and Treatment may vary due to age or medical condition, Ondansetron  and Dexamethasone   Airway Management Planned: Oral ETT  Additional Equipment: None  Intra-op Plan:   Post-operative Plan: Extubation in OR  Informed Consent: I have reviewed the patients History and Physical, chart, labs and discussed the procedure including the risks, benefits and alternatives for the proposed anesthesia with the patient or authorized representative who has indicated his/her understanding and acceptance.     Dental advisory given  Plan Discussed with: [name]  Anesthesia Plan Comments: (Last airway note: Ventilation: Mask ventilation without difficulty Laryngoscope Size: Mac and 4 Grade View: Grade I Tube type: Oral Tube size: 7.0 mm Number of attempts: 1 )         Anesthesia Quick Evaluation

## 2024-05-16 NOTE — Pre-Procedure Instructions (Signed)
 Instructed patient on the following items: Arrival time 1000 Nothing to eat or drink after midnight No meds AM of procedure Responsible person to drive you home and stay with you for 24 hrs  Have you missed any doses of anti-coagulant Eliquis- takes twice a day, hasn't missed any doses in last 4 weeks.  Don't take dose morning of procedure.

## 2024-05-17 ENCOUNTER — Encounter (HOSPITAL_COMMUNITY)
Admission: RE | Disposition: A | Discharge status: Home / Self Care | Payer: Self-pay | Source: Home / Self Care | Attending: Cardiovascular Disease

## 2024-05-17 ENCOUNTER — Ambulatory Visit (HOSPITAL_COMMUNITY)
Admission: RE | Admit: 2024-05-17 | Discharge: 2024-05-17 | Disposition: A | Discharge status: Home / Self Care | Attending: Cardiovascular Disease | Admitting: Cardiovascular Disease

## 2024-05-17 ENCOUNTER — Ambulatory Visit (HOSPITAL_COMMUNITY): Admitting: Anesthesiology

## 2024-05-17 ENCOUNTER — Encounter (HOSPITAL_COMMUNITY): Payer: Self-pay | Admitting: Cardiovascular Disease

## 2024-05-17 ENCOUNTER — Other Ambulatory Visit: Payer: Self-pay

## 2024-05-17 DIAGNOSIS — I484 Atypical atrial flutter: Secondary | ICD-10-CM

## 2024-05-17 DIAGNOSIS — E785 Hyperlipidemia, unspecified: Secondary | ICD-10-CM

## 2024-05-17 DIAGNOSIS — D6869 Other thrombophilia: Secondary | ICD-10-CM | POA: Diagnosis not present

## 2024-05-17 DIAGNOSIS — Z79899 Other long term (current) drug therapy: Secondary | ICD-10-CM | POA: Insufficient documentation

## 2024-05-17 DIAGNOSIS — I052 Rheumatic mitral stenosis with insufficiency: Secondary | ICD-10-CM | POA: Diagnosis not present

## 2024-05-17 DIAGNOSIS — R001 Bradycardia, unspecified: Secondary | ICD-10-CM | POA: Insufficient documentation

## 2024-05-17 DIAGNOSIS — I4819 Other persistent atrial fibrillation: Secondary | ICD-10-CM | POA: Insufficient documentation

## 2024-05-17 DIAGNOSIS — Z7901 Long term (current) use of anticoagulants: Secondary | ICD-10-CM | POA: Insufficient documentation

## 2024-05-17 DIAGNOSIS — I10 Essential (primary) hypertension: Secondary | ICD-10-CM | POA: Diagnosis not present

## 2024-05-17 DIAGNOSIS — I4892 Unspecified atrial flutter: Secondary | ICD-10-CM | POA: Diagnosis not present

## 2024-05-17 HISTORY — PX: A-FLUTTER ABLATION: EP1230

## 2024-05-17 LAB — POCT ACTIVATED CLOTTING TIME: Activated Clotting Time: 268 s

## 2024-05-17 MED ORDER — EPHEDRINE SULFATE-NACL 50-0.9 MG/10ML-% IV SOSY
PREFILLED_SYRINGE | INTRAVENOUS | Status: DC | PRN
  Administered 2024-05-17: 15 mg via INTRAVENOUS
  Administered 2024-05-17: 10 mg via INTRAVENOUS

## 2024-05-17 MED ORDER — FENTANYL CITRATE (PF) 250 MCG/5ML IJ SOLN
INTRAMUSCULAR | Status: DC | PRN
  Administered 2024-05-17 (×2): 50 ug via INTRAVENOUS

## 2024-05-17 MED ORDER — ONDANSETRON HCL 4 MG/2ML IJ SOLN
INTRAMUSCULAR | Status: DC | PRN
  Administered 2024-05-17: 4 mg via INTRAVENOUS

## 2024-05-17 MED ORDER — SODIUM CHLORIDE 0.9 % IV SOLN: INTRAVENOUS | Status: DC

## 2024-05-17 MED ORDER — ACETAMINOPHEN 325 MG PO TABS: 650.0000 mg | ORAL_TABLET | ORAL | Status: DC | PRN

## 2024-05-17 MED ORDER — ATROPINE SULFATE 1 MG/10ML IJ SOSY
PREFILLED_SYRINGE | INTRAMUSCULAR | Status: AC
  Filled 2024-05-17: qty 10

## 2024-05-17 MED ORDER — SODIUM CHLORIDE 0.9 % IV SOLN
250.0000 mL | INTRAVENOUS | Status: DC | PRN
Start: 2024-05-17 — End: 2024-05-17

## 2024-05-17 MED ORDER — HEPARIN (PORCINE) IN NACL 1000-0.9 UT/500ML-% IV SOLN
INTRAVENOUS | Status: DC | PRN
  Administered 2024-05-17 (×3): 500 mL

## 2024-05-17 MED ORDER — SUGAMMADEX SODIUM 200 MG/2ML IV SOLN
INTRAVENOUS | Status: DC | PRN
Start: 2024-05-17 — End: 2024-05-17
  Administered 2024-05-17: 175 mg via INTRAVENOUS

## 2024-05-17 MED ORDER — HEPARIN SODIUM (PORCINE) 1000 UNIT/ML IJ SOLN
INTRAMUSCULAR | Status: AC
  Filled 2024-05-17: qty 10

## 2024-05-17 MED ORDER — PROPOFOL 10 MG/ML IV BOLUS
INTRAVENOUS | Status: DC | PRN
  Administered 2024-05-17: 150 mg via INTRAVENOUS

## 2024-05-17 MED ORDER — DEXAMETHASONE SODIUM PHOSPHATE 10 MG/ML IJ SOLN
INTRAMUSCULAR | Status: DC | PRN
  Administered 2024-05-17: 5 mg via INTRAVENOUS

## 2024-05-17 MED ORDER — ROCURONIUM BROMIDE 10 MG/ML (PF) SYRINGE
PREFILLED_SYRINGE | INTRAVENOUS | Status: DC | PRN
  Administered 2024-05-17: 10 mg via INTRAVENOUS
  Administered 2024-05-17: 60 mg via INTRAVENOUS

## 2024-05-17 MED ORDER — LIDOCAINE 2% (20 MG/ML) 5 ML SYRINGE
INTRAMUSCULAR | Status: DC | PRN
  Administered 2024-05-17: 60 mg via INTRAVENOUS

## 2024-05-17 MED ORDER — SODIUM CHLORIDE 0.9% FLUSH: 3.0000 mL | Freq: Two times a day (BID) | INTRAVENOUS | Status: DC

## 2024-05-17 MED ORDER — PROTAMINE SULFATE 10 MG/ML IV SOLN
INTRAVENOUS | Status: DC | PRN
  Administered 2024-05-17 (×2): 20 mg via INTRAVENOUS

## 2024-05-17 MED ORDER — PHENYLEPHRINE 80 MCG/ML (10ML) SYRINGE FOR IV PUSH (FOR BLOOD PRESSURE SUPPORT)
PREFILLED_SYRINGE | INTRAVENOUS | Status: DC | PRN
  Administered 2024-05-17: 160 ug via INTRAVENOUS
  Administered 2024-05-17: 80 ug via INTRAVENOUS
  Administered 2024-05-17 (×2): 160 ug via INTRAVENOUS
  Administered 2024-05-17 (×2): 80 ug via INTRAVENOUS
  Administered 2024-05-17: 160 ug via INTRAVENOUS

## 2024-05-17 MED ORDER — FENTANYL CITRATE (PF) 100 MCG/2ML IJ SOLN
INTRAMUSCULAR | Status: AC
Start: 2024-05-17 — End: 2024-05-17
  Filled 2024-05-17: qty 2

## 2024-05-17 MED ORDER — HEPARIN SODIUM (PORCINE) 1000 UNIT/ML IJ SOLN
INTRAMUSCULAR | Status: DC | PRN
  Administered 2024-05-17: 13000 [IU] via INTRAVENOUS

## 2024-05-17 MED ORDER — ONDANSETRON HCL 4 MG/2ML IJ SOLN: 4.0000 mg | Freq: Four times a day (QID) | INTRAMUSCULAR | Status: DC | PRN

## 2024-05-17 MED ORDER — ATROPINE SULFATE 1 MG/ML IV SOLN
INTRAVENOUS | Status: DC | PRN
  Administered 2024-05-17: 1 mg via INTRAVENOUS

## 2024-05-17 MED ORDER — PHENYLEPHRINE HCL-NACL 20-0.9 MG/250ML-% IV SOLN
INTRAVENOUS | Status: DC | PRN
  Administered 2024-05-17: 15 ug/min via INTRAVENOUS

## 2024-05-17 MED ORDER — SODIUM CHLORIDE 0.9% FLUSH: 3.0000 mL | INTRAVENOUS | Status: DC | PRN

## 2024-05-17 NOTE — Discharge Instructions (Signed)

## 2024-05-17 NOTE — Interval H&P Note (Signed)
 History and Physical Interval Note:  05/17/2024 11:24 AM  James [name]  has presented today for surgery, with the diagnosis of afluuter.  The various methods of treatment have been discussed with the patient and family. After consideration of risks, benefits and other options for treatment, the patient has consented to  Procedure(s): A-FLUTTER ABLATION (N/A) as a surgical intervention.  The patient's history has been reviewed, patient examined, no change in status, stable for surgery.  I have reviewed the patient's chart and labs.  Questions were answered to the patient's satisfaction.     Jonice Cerra E Jamesina Gaugh

## 2024-05-17 NOTE — Transfer of Care (Signed)
 Immediate Anesthesia Transfer of Care Note  Patient: James [name]  Procedure(s) Performed: A-FLUTTER ABLATION  Patient Location: PACU and Cath Lab  Anesthesia Type:General  Level of Consciousness: drowsy and patient cooperative  Airway & Oxygen Therapy: Patient Spontanous Breathing and Patient connected to face mask oxygen  Post-op Assessment: Report given to RN and Post -op Vital signs reviewed and stable  Post vital signs: Reviewed and stable  Last Vitals:  Vitals Value Taken Time  BP    Temp    Pulse    Resp    SpO2      Last Pain:  Vitals:   05/17/24 1044  TempSrc: Oral  PainSc:       Patients Stated Pain Goal: 4 (05/17/24 1018)  Complications: No notable events documented.

## 2024-05-17 NOTE — Anesthesia Procedure Notes (Signed)
 Procedure Name: Intubation Date/Time: 05/17/2024 12:04 PM  Performed by: Genny Gun, CRNAPre-anesthesia Checklist: Patient identified, Patient being monitored, Timeout performed, Emergency Drugs available and Suction available Patient Re-evaluated:Patient Re-evaluated prior to induction Oxygen Delivery Method: Circle system utilized Preoxygenation: Pre-oxygenation with 100% oxygen Induction Type: IV induction Ventilation: Two handed mask ventilation required Laryngoscope Size: Mac and 4 Grade View: Grade II Tube type: Oral Tube size: 7.5 mm Number of attempts: 1 Airway Equipment and Method: Stylet Placement Confirmation: ETT inserted through vocal cords under direct vision, positive ETCO2 and breath sounds checked- equal and bilateral Secured at: 23 cm Tube secured with: Tape Dental Injury: Teeth and Oropharynx as per pre-operative assessment

## 2024-05-17 NOTE — Progress Notes (Signed)
 Patient ambulated to BR. Both groins are unremarkable.

## 2024-05-17 NOTE — Progress Notes (Signed)
 PAtient stated he has not missed any doses of eliquis .

## 2024-05-17 NOTE — Anesthesia Postprocedure Evaluation (Signed)
 Anesthesia Post Note  Patient: James [name]  Procedure(s) Performed: A-FLUTTER ABLATION     Patient location during evaluation: PACU Anesthesia Type: General Level of consciousness: awake and alert, oriented and patient cooperative Pain management: pain level controlled Vital Signs Assessment: post-procedure vital signs reviewed and stable Respiratory status: spontaneous breathing, nonlabored ventilation and respiratory function stable Cardiovascular status: blood pressure returned to baseline and stable Postop Assessment: no apparent nausea or vomiting Anesthetic complications: no   No notable events documented.  Last Vitals:  Vitals:   05/17/24 1430 05/17/24 1445  BP: 104/65 107/67  Pulse: 73 70  Resp: 14 16  Temp:    SpO2: 97% 98%    Last Pain:  Vitals:   05/17/24 1412  TempSrc:   PainSc: 0-No pain   Pain Goal: Patients Stated Pain Goal: 4 (05/17/24 1412)                 [name]

## 2024-05-18 ENCOUNTER — Telehealth (HOSPITAL_COMMUNITY): Payer: Self-pay

## 2024-05-18 NOTE — Telephone Encounter (Signed)
 Spoke with patient to complete post procedure follow up call.  Patient reports no complications with groin sites.   Instructions reviewed with patient:  Remove large bandage at puncture site after 24 hours. It is normal to have bruising, tenderness, mild swelling, and a pea or marble sized lump/knot at the groin site which can take up to three months to resolve.  Get help right away if you notice sudden swelling at the puncture site.  Check your puncture site every day for signs of infection: fever, redness, swelling, pus drainage, warmth, foul odor or excessive pain. If this occurs, please call the office at [phone], to speak with the nurse. Get help right away if your puncture site is bleeding and the bleeding does not stop after applying firm pressure to the area.  You may continue to have skipped beats/ atrial flutter during the first several months after your procedure.  It is very important not to miss any doses of your blood thinner Eliquis .   You will follow up with Dr.Augustus Mealor on 06/23/24.   Patient verbalized understanding to all instructions provided.

## 2024-05-18 NOTE — Telephone Encounter (Signed)
 Attempted to reach patient to follow up with procedure completed on 05/17/24, no answer. Left VM for patient to return call.

## 2024-05-19 ENCOUNTER — Encounter: Payer: Self-pay | Admitting: Pulmonary Disease

## 2024-05-19 ENCOUNTER — Telehealth: Payer: Self-pay | Admitting: Cardiology

## 2024-05-19 ENCOUNTER — Ambulatory Visit: Attending: Pulmonary Disease | Admitting: Pulmonary Disease

## 2024-05-19 VITALS — BP 97/65 | HR 87 | Ht 71.0 in | Wt 200.4 lb

## 2024-05-19 DIAGNOSIS — R001 Bradycardia, unspecified: Secondary | ICD-10-CM | POA: Diagnosis not present

## 2024-05-19 DIAGNOSIS — I484 Atypical atrial flutter: Secondary | ICD-10-CM

## 2024-05-19 DIAGNOSIS — Z9889 Other specified postprocedural states: Secondary | ICD-10-CM | POA: Diagnosis not present

## 2024-05-19 DIAGNOSIS — D6869 Other thrombophilia: Secondary | ICD-10-CM

## 2024-05-19 DIAGNOSIS — I4819 Other persistent atrial fibrillation: Secondary | ICD-10-CM | POA: Diagnosis not present

## 2024-05-19 MED ORDER — METOPROLOL SUCCINATE ER 25 MG PO TB24: 25.0000 mg | ORAL_TABLET | Freq: Every day | ORAL | 3 refills | Status: DC

## 2024-05-19 NOTE — Patient Instructions (Signed)
 Medication Instructions:  Start metoprolol  succinate (Toprol  XL) 25 mg daily at bedtime *If you need a refill on your cardiac medications before your next appointment, please call your pharmacy*  Lab Work: None ordered If you have labs (blood work) drawn today and your tests are completely normal, you will receive your results only by: MyChart Message (if you have MyChart) OR A paper copy in the mail If you have any lab test that is abnormal or we need to change your treatment, we will call you to review the results.  Follow-Up: At [facility], you and your health needs are our priority.  As part of our continuing mission to provide you with exceptional heart care, our providers are all part of one team.  This team includes your primary Cardiologist (physician) and Advanced Practice Providers or APPs (Physician Assistants and Nurse Practitioners) who all work together to provide you with the care you need, when you need it.  Your next appointment:   As scheduled

## 2024-05-19 NOTE — Progress Notes (Signed)
 Electrophysiology Office Note:   Date:  05/19/2024  ID:  James [name], DOB April 16, 1946, MRN [id number]  Primary Cardiologist: [name], MD Primary Heart Failure: None Electrophysiologist: [name], MD      History of Present Illness:   James [name] is a 78 y.o. male with h/o  mitral stenosis s/p MV repair (2014), persistent AF, prior AFL s/p ablation, occasional ETOH use seen today for acute visit due to palpitations, elevated HR's to 140's.    Patient reports he felt fine after ablation on 05/17/24 in the immediate post procedure period. He states he did not drink his usual glass of wine that evening but he did last night.  Today he woke with elevated HR's into the 130-140's.  He had left over Metoprolol  100 mg and took one as he panicked.  He reports he was taken off the Metoprolol  several months ago due to low HR's.      He denies chest pain, palpitations, dyspnea, PND, orthopnea, nausea, vomiting, dizziness, syncope, edema, weight gain, or early satiety.   Review of systems complete and found to be negative unless listed in HPI.   EP Information / Studies Reviewed:    EKG is ordered today. Personal review as below.  EKG Interpretation Date/Time:  Wednesday May 19 2024 15:59:04 EDT Ventricular Rate:  87 PR Interval:    QRS Duration:  88 QT Interval:  388 QTC Calculation: 466 R Axis:   -36  Text Interpretation: Atrial Tachycardia with 2nd degree A-V block (Mobitz I) Left axis deviation Confirmed by Aniceto Jarvis ([id number]) on 05/19/2024 5:17:35 PM   Arrhythmia / AAD Persistent AF > two persistent episodes in past -  one precipitated possibly by excessive alcohol and missing medications, the other by gall bladder surgery  DCCV 01/2023 200j, NSR DCCV 07/2023 200j, NSR > ERAF after 2.5 days AFL s/p Ablation in 2018 Amiodarone  > initiated 08/2023 with AF > stopped 02/2024 due to low HR / dizziness EPS 10/31/23 > AF on presentation, PVI ablation with PF energy, ablation  of PW with PF energy, junctional rhythm post DCCV DCCV 12/30/23 > 200j x1> NSR  DCCV 02/04/24 > 200 j x1 > SB EPS 05/17/24 > atypical AFL on presentation, ablation of an atypical perimitral flutter with a septal mitral isthmus line  Risk Assessment/Calculations:    CHA2DS2-VASc Score = 3   This indicates a 3.2% annual risk of stroke. The patient's score is based upon: CHF History: 0 HTN History: 1 Diabetes History: 0 Stroke History: 0 Vascular Disease History: 0 Age Score: 2 Gender Score: 0             Physical Exam:   VS:  BP 97/65   Pulse 87   Ht 5' 11 (1.803 m)   Wt 200 lb 6.4 oz (90.9 kg)   SpO2 95%   BMI 27.95 kg/m    Wt Readings from Last 3 Encounters:  05/19/24 200 lb 6.4 oz (90.9 kg)  05/17/24 190 lb (86.2 kg)  04/28/24 199 lb 8 oz (90.5 kg)     GEN: Well nourished, well developed in no acute distress NECK: No JVD; No carotid bruits CARDIAC: S1S2 , no murmurs, rubs, gallops RESPIRATORY:  Clear to auscultation without rales, wheezing or rhonchi  ABDOMEN: Soft, non-tender, non-distended EXTREMITIES:  No edema; No deformity   ASSESSMENT AND PLAN:    Persistent Atrial Fibrillation  Atypical Atrial Flutter  CHA2DS2-VASc 3 / 3.2% annual risk CVA, DCCV 07/2023 with NSR > ERAF after 2.5 days. S/p  AFL ablation 05/17/24 -OAC for stroke prophylaxis  -EKG with AT, Wenckebach  -monitors with Apple Watch  -Resume Toprol  at 25 mg daily > soft BP, occ baseline lightheadedness & bradycardia  -not BP & HR on today's exam is post 100 mg of metoprolol   Sinus Bradycardia  -caution with anti-chronotropic meds  Secondary Hypercoagulable State  -continue apixiban 5mg  BID, dose reviewed and appropriate by age / wt   VHD: MS s/p MV Repair  -mild MS   Hypertension  -well controlled on current regimen      Follow up with Dr. [name] as planned post ablation    Signed, Daphne Barrack, NP-C, AGACNP-BC [facility] - Electrophysiology  05/19/2024, 5:18 PM

## 2024-05-19 NOTE — Telephone Encounter (Signed)
 Outpatient service line:  Patient with recent atrial flutter ablation now reporting asymptomatic tachycardia since last night with heart rates persistently around 140 based off of his Apple Watch.  I have arranged follow-up to see Brandi today at 335.

## 2024-05-20 MED FILL — Fentanyl Citrate Preservative Free (PF) Inj 100 MCG/2ML: INTRAMUSCULAR | Qty: 2 | Status: AC

## 2024-05-20 MED FILL — Atropine Sulfate Soln Prefill Syr 1 MG/10ML (0.1 MG/ML): INTRAMUSCULAR | Qty: 10 | Status: AC

## 2024-06-21 ENCOUNTER — Telehealth: Payer: Self-pay | Admitting: Cardiovascular Disease

## 2024-06-21 NOTE — Telephone Encounter (Signed)
 Spoke with patient of Dr. [name]. He reports Apple Watch indicates his HR has been >100 for 10 days. He said Apple Watch indicates sinus rhythm. He went golfing yesterday. He is out of town today, in [place]. He feels fine. Denies CP, shortness of breath.  BP 128/95 - Saturday  He is compliant with medications.   He has an appt 06/23/24  Will send message to MD for review

## 2024-06-21 NOTE — Telephone Encounter (Signed)
  STAT if HR is under 50 or over 120  (normal HR is 60-100 beats per minute)  What is your heart rate? 125 today  Do you have a log of your heart rate readings (document readings)? Yes, 120-132 over the last 10 days  Do you have any other symptoms? No, states he feels fine

## 2024-06-22 NOTE — Telephone Encounter (Signed)
 Pt is scheduled to see Dr [name] 9/24/

## 2024-06-23 ENCOUNTER — Ambulatory Visit: Attending: Cardiology | Admitting: Cardiovascular Disease

## 2024-06-23 ENCOUNTER — Encounter: Payer: Self-pay | Admitting: Cardiovascular Disease

## 2024-06-23 VITALS — BP 122/68 | HR 130 | Ht 71.0 in | Wt 199.0 lb

## 2024-06-23 DIAGNOSIS — R001 Bradycardia, unspecified: Secondary | ICD-10-CM

## 2024-06-23 DIAGNOSIS — I484 Atypical atrial flutter: Secondary | ICD-10-CM | POA: Diagnosis not present

## 2024-06-23 DIAGNOSIS — I4819 Other persistent atrial fibrillation: Secondary | ICD-10-CM | POA: Diagnosis not present

## 2024-06-23 DIAGNOSIS — Z01812 Encounter for preprocedural laboratory examination: Secondary | ICD-10-CM

## 2024-06-23 MED ORDER — DRONEDARONE HCL 400 MG PO TABS: 400.0000 mg | ORAL_TABLET | Freq: Two times a day (BID) | ORAL | 5 refills | Status: DC

## 2024-06-23 NOTE — Patient Instructions (Addendum)
 Medication Instructions: Your physician has recommended you make the following change in your medication:   ** Start Multaq  400mg  - 1 tablet by mouth twice daily  *If you need a refill on your cardiac medications before your next appointment, please call your pharmacy*  Lab Work: CBC and BMET today If you have labs (blood work) drawn today and your tests are completely normal, you will receive your results only by: MyChart Message (if you have MyChart) OR A paper copy in the mail If you have any lab test that is abnormal or we need to change your treatment, we will call you to review the results.  Testing/Procedures: Your physician has recommended that you have a Cardioversion (DCCV). Electrical Cardioversion uses a jolt of electricity to your heart either through paddles or wired patches attached to your chest. This is a controlled, usually prescheduled, procedure. Defibrillation is done under light anesthesia in the hospital, and you usually go home the day of the procedure. This is done to get your heart back into a normal rhythm. You are not awake for the procedure. Please see the instruction sheet given to you today.  Your physician has recommended that you have an ablation. Catheter ablation is a medical procedure used to treat some cardiac arrhythmias (irregular heartbeats). During catheter ablation, a long, thin, flexible tube is put into a blood vessel in your groin (upper thigh), or neck. This tube is called an ablation catheter. It is then guided to your heart through the blood vessel. Radio frequency waves destroy small areas of heart tissue where abnormal heartbeats may cause an arrhythmia to start. Please see the instruction sheet given to you today.   Follow-Up: At [facility], you and your health needs are our priority.  As part of our continuing mission to provide you with exceptional heart care, our providers are all part of one team.  This team includes your primary  Cardiologist (physician) and Advanced Practice Providers or APPs (Physician Assistants and Nurse Practitioners) who all work together to provide you with the care you need, when you need it.  Your next appointment:   To be scheduled  Other Instructions    Dear James [name]  You are scheduled for a Cardioversion on Tuesday, September 30 with Dr. Lonni.  Please arrive at the [facility] (Main Entrance A) at [facility]: [address] [place], [place] [address] at 6:30 AM (This time is 1 hour(s) before your procedure to ensure your preparation).   Free valet parking service is available. You will check in at ADMITTING.   *Please Note: You will receive a call the day before your procedure to confirm the appointment time. That time may have changed from the original time based on the schedule for that day.*   DIET:  Nothing to eat or drink after midnight except a sip of water with medications (see medication instructions below)  MEDICATION INSTRUCTIONS: !!IF ANY NEW MEDICATIONS ARE STARTED AFTER TODAY, PLEASE NOTIFY YOUR PROVIDER AS SOON AS POSSIBLE!!   Continue taking your anticoagulant (blood thinner): Apixaban  (Eliquis ).  You will need to continue this after your procedure until you are told by your provider that it is safe to stop.    LABS: CBC and BMET today   FYI:  For your safety, and to allow us  to monitor your vital signs accurately during the surgery/procedure we request: If you have artificial nails, gel coating, SNS etc, please have those removed prior to your surgery/procedure. Not having the  nail coverings /polish removed may result in cancellation or delay of your surgery/procedure.  Your support person will be asked to wait in the waiting room during your procedure.  It is OK to have someone drop you off and come back when you are ready to be discharged.  You cannot drive after the procedure and will need someone to drive you home.  Bring your insurance  cards.  *Special Note: Every effort is made to have your procedure done on time. Occasionally there are emergencies that occur at the hospital that may cause delays. Please be patient if a delay does occur.

## 2024-06-23 NOTE — H&P (View-Only) (Signed)
 Electrophysiology Office Note:    Date:  06/23/2024   ID:  James [name], DOB 21-Dec-1945, MRN [id number]  PCP:  James Dorn LABOR, MD   [facility] Providers Cardiologist:  James Parchment, MD Electrophysiologist:  James FORBES Furbish, MD     Referring MD: James [name], *   History of Present Illness:    James [name] is a 78 y.o. male with a hx of Mitral valve repair in 2014significant for atrial fibrillation, referred for arrhythmia management.  He has been followed by Dr. [name] in the past and underwent atrial flutter ablation in 2018.  He was noted by his PCP to have an irregular rhythm and referred back for management of atrial flutter.  He has been managed in atrial fibrillation clinic.  He underwent a cardioversion in January.  In follow-up in May 2024 he was noted to be in atrial flutter and underwent DC cardioversion subsequently.  He has not had any symptoms with these atrial fibrillation episodes.  He underwent DC cardioversions in October 2024 and again in December 2024.  He has had recurrence of atrial fibrillation within a short time.  He was given amiodarone  prior to the most recent cardioversion.  He underwent an ablation for atrial fibrillation on October 31, 2023.  The pulmonary veins and posterior wall were abated with pulsed field energy.  Cardioversion was required during the procedure, and the patient converted to a junctional rhythm.    He subsequently developed an atypical atrial flutter and has undergone cardioversion again in April and May 2025.  In June to 2025, amiodarone  was decreased to 100 mg daily.  His atypical flutter subsequently recurred.  He underwent ablation on May 17, 2024 for atypical atrial flutter.  At that time the veins and posterior wall were durably isolated.  We performed a septal mitral isthmus line.  He returns today for follow-up.  Unfortunately his EKG shows an atrial tachycardia versus an atypical atrial flutter  --the P wave morphology looks identical to the P wave prior to his ablation, so I suspect that he has recurrence of perimitral flutter with a slower cycle length due to to the recent ablation.  He reports today that he feels generally well, though not 100%.  EKGs/Labs/Other Studies Reviewed Today:    Echocardiogram:  TTE 02/18/2023 LVEF 60-65%, moderate LVH. Severely dilated left atrium. S/p MVR with mild mitral stenosis   Monitors:   Stress testing:   Advanced imaging:   Cardiac catherization   EKG:  Last EKG results: today - sinus rhythm with first degree AV block    Physical Exam:    VS:  BP 122/68 (BP Location: Right Arm, Patient Position: Sitting, Cuff Size: Large)   Pulse (!) 130   Ht 5' 11 (1.803 m)   Wt 199 lb (90.3 kg)   SpO2 98%   BMI 27.75 kg/m     Wt Readings from Last 3 Encounters:  06/23/24 199 lb (90.3 kg)  05/19/24 200 lb 6.4 oz (90.9 kg)  05/17/24 190 lb (86.2 kg)     GEN: Well nourished, well developed in no acute distress CARDIAC: RRR, no murmurs, rubs, gallops RESPIRATORY:  Normal work of breathing MUSCULOSKELETAL: no edema    ASSESSMENT & PLAN:    Persistent atrial fibrillation Mild, if any symptoms Status post ablation of atrial fibrillation, typical flutter, atypical atrial flutter  Atypical perimitral atrial flutter Status post ablation of the anterior mitral isthmus May 17, 2024 Now with what appears to be recurrence of flutter  with a significantly slower cycle length We discussed management options We will start Multaq  and plan for DC cardioversion I think a repeat EP study is warranted as we may just need to performed a small revision of the mitral line. I will plan to cross with the Fara drive sheath and map with the Octarray.  Depending upon findings, we may use a QDOT to perform discrete ablation on the mitral isthmus line.  We discussed the indication, rationale, logistics, anticipated benefits, and potential risks  of the ablation procedure including but not limited to -- bleed at the groin access site, chest pain, damage to nearby organs such as the diaphragm, lungs, or esophagus, need for a drainage tube, or prolonged hospitalization. I explained that the risk for stroke, heart attack, need for open chest surgery, or even death is very low but not zero. he  expressed understanding and wishes to proceed.   Sinus bradycardia Caution with antichronotropic medications  Secondary hypercoagulable state Continue apixaban  5 mg  S/P mitral valve repair for mitral stenosis Mild MS      Signed, James FORBES Furbish, MD  06/23/2024 11:04 AM    [facility]

## 2024-06-23 NOTE — Progress Notes (Signed)
 Electrophysiology Office Note:    Date:  06/23/2024   ID:  James [name], DOB 21-Dec-1945, MRN [id number]  PCP:  James Dorn LABOR, MD   [facility] Providers Cardiologist:  [name], MD Electrophysiologist:  [name], MD     Referring MD: James [name], *   History of Present Illness:    James [name] is a 78 y.o. male with a hx of Mitral valve repair in 2014significant for atrial fibrillation, referred for arrhythmia management.  He has been followed by Dr. [name] in the past and underwent atrial flutter ablation in 2018.  He was noted by his PCP to have an irregular rhythm and referred back for management of atrial flutter.  He has been managed in atrial fibrillation clinic.  He underwent a cardioversion in January.  In follow-up in May 2024 he was noted to be in atrial flutter and underwent DC cardioversion subsequently.  He has not had any symptoms with these atrial fibrillation episodes.  He underwent DC cardioversions in October 2024 and again in December 2024.  He has had recurrence of atrial fibrillation within a short time.  He was given amiodarone  prior to the most recent cardioversion.  He underwent an ablation for atrial fibrillation on October 31, 2023.  The pulmonary veins and posterior wall were abated with pulsed field energy.  Cardioversion was required during the procedure, and the patient converted to a junctional rhythm.    He subsequently developed an atypical atrial flutter and has undergone cardioversion again in April and May 2025.  In June to 2025, amiodarone  was decreased to 100 mg daily.  His atypical flutter subsequently recurred.  He underwent ablation on May 17, 2024 for atypical atrial flutter.  At that time the veins and posterior wall were durably isolated.  We performed a septal mitral isthmus line.  He returns today for follow-up.  Unfortunately his EKG shows an atrial tachycardia versus an atypical atrial flutter  --the P wave morphology looks identical to the P wave prior to his ablation, so I suspect that he has recurrence of perimitral flutter with a slower cycle length due to to the recent ablation.  He reports today that he feels generally well, though not 100%.  EKGs/Labs/Other Studies Reviewed Today:    Echocardiogram:  TTE 02/18/2023 LVEF 60-65%, moderate LVH. Severely dilated left atrium. S/p MVR with mild mitral stenosis   Monitors:   Stress testing:   Advanced imaging:   Cardiac catherization   EKG:  Last EKG results: today - sinus rhythm with first degree AV block    Physical Exam:    VS:  BP 122/68 (BP Location: Right Arm, Patient Position: Sitting, Cuff Size: Large)   Pulse (!) 130   Ht 5' 11 (1.803 m)   Wt 199 lb (90.3 kg)   SpO2 98%   BMI 27.75 kg/m     Wt Readings from Last 3 Encounters:  06/23/24 199 lb (90.3 kg)  05/19/24 200 lb 6.4 oz (90.9 kg)  05/17/24 190 lb (86.2 kg)     GEN: Well nourished, well developed in no acute distress CARDIAC: RRR, no murmurs, rubs, gallops RESPIRATORY:  Normal work of breathing MUSCULOSKELETAL: no edema    ASSESSMENT & PLAN:    Persistent atrial fibrillation Mild, if any symptoms Status post ablation of atrial fibrillation, typical flutter, atypical atrial flutter  Atypical perimitral atrial flutter Status post ablation of the anterior mitral isthmus May 17, 2024 Now with what appears to be recurrence of flutter  with a significantly slower cycle length We discussed management options We will start Multaq  and plan for DC cardioversion I think a repeat EP study is warranted as we may just need to performed a small revision of the mitral line. I will plan to cross with the Fara drive sheath and map with the Octarray.  Depending upon findings, we may use a QDOT to perform discrete ablation on the mitral isthmus line.  We discussed the indication, rationale, logistics, anticipated benefits, and potential risks  of the ablation procedure including but not limited to -- bleed at the groin access site, chest pain, damage to nearby organs such as the diaphragm, lungs, or esophagus, need for a drainage tube, or prolonged hospitalization. I explained that the risk for stroke, heart attack, need for open chest surgery, or even death is very low but not zero. he  expressed understanding and wishes to proceed.   Sinus bradycardia Caution with antichronotropic medications  Secondary hypercoagulable state Continue apixaban  5 mg  S/P mitral valve repair for mitral stenosis Mild MS      Signed, [name], MD  06/23/2024 11:04 AM    [facility]

## 2024-06-24 ENCOUNTER — Ambulatory Visit: Payer: Self-pay

## 2024-06-24 LAB — CBC
Hematocrit: 39.4 % (ref 37.5–51.0)
Hemoglobin: 13.3 g/dL (ref 13.0–17.7)
MCH: 35.6 pg — ABNORMAL HIGH (ref 26.6–33.0)
MCHC: 33.8 g/dL (ref 31.5–35.7)
MCV: 105 fL — ABNORMAL HIGH (ref 79–97)
Platelets: 164 x10E3/uL (ref 150–450)
RBC: 3.74 x10E6/uL — ABNORMAL LOW (ref 4.14–5.80)
RDW: 13.2 % (ref 11.6–15.4)
WBC: 4.4 x10E3/uL (ref 3.4–10.8)

## 2024-06-24 LAB — BASIC METABOLIC PANEL WITH GFR
BUN/Creatinine Ratio: 12 (ref 10–24)
BUN: 16 mg/dL (ref 8–27)
CO2: 19 mmol/L — AB (ref 20–29)
Calcium: 9.4 mg/dL (ref 8.6–10.2)
Chloride: 96 mmol/L (ref 96–106)
Creatinine, Ser: 1.34 mg/dL — AB (ref 0.76–1.27)
Glucose: 81 mg/dL (ref 70–99)
Potassium: 5 mmol/L (ref 3.5–5.2)
Sodium: 132 mmol/L — AB (ref 134–144)
eGFR: 55 mL/min/1.73 — AB (ref >59)

## 2024-06-24 NOTE — Addendum Note (Signed)
 Addended by: CASIMIR ALDONA BRAVO on: 06/24/2024 05:10 PM   Modules accepted: Orders

## 2024-06-24 NOTE — Addendum Note (Signed)
 Addended by: CASIMIR ALDONA BRAVO on: 06/24/2024 05:02 PM   Modules accepted: Orders

## 2024-06-25 ENCOUNTER — Ambulatory Visit: Admitting: Cardiovascular Disease

## 2024-06-28 NOTE — Progress Notes (Signed)
 Left detailed VM for  patient and instructed them to come at 0700  and to be NPO after 0000.  Medications reviewed and patient instructed to take Eliquis .   Advised that patient have a ride home and someone to stay with them for 24 hours after the procedure.   Advised  no breaks in taking blood thinner for 3+ weeks prior to procedure.  Left number to call back if they have additional questions.

## 2024-06-29 ENCOUNTER — Encounter (HOSPITAL_COMMUNITY)
Admission: RE | Disposition: A | Discharge status: Home / Self Care | Payer: Self-pay | Source: Home / Self Care | Attending: Cardiology

## 2024-06-29 ENCOUNTER — Other Ambulatory Visit: Payer: Self-pay

## 2024-06-29 ENCOUNTER — Ambulatory Visit (HOSPITAL_COMMUNITY)
Admission: RE | Admit: 2024-06-29 | Discharge: 2024-06-29 | Disposition: A | Attending: Cardiology | Admitting: Cardiology

## 2024-06-29 ENCOUNTER — Ambulatory Visit (HOSPITAL_COMMUNITY)

## 2024-06-29 ENCOUNTER — Ambulatory Visit (HOSPITAL_BASED_OUTPATIENT_CLINIC_OR_DEPARTMENT_OTHER)

## 2024-06-29 ENCOUNTER — Encounter (HOSPITAL_COMMUNITY): Payer: Self-pay | Admitting: Cardiology

## 2024-06-29 DIAGNOSIS — I052 Rheumatic mitral stenosis with insufficiency: Secondary | ICD-10-CM | POA: Insufficient documentation

## 2024-06-29 DIAGNOSIS — I4819 Other persistent atrial fibrillation: Secondary | ICD-10-CM | POA: Insufficient documentation

## 2024-06-29 DIAGNOSIS — I10 Essential (primary) hypertension: Secondary | ICD-10-CM

## 2024-06-29 DIAGNOSIS — K219 Gastro-esophageal reflux disease without esophagitis: Secondary | ICD-10-CM | POA: Diagnosis not present

## 2024-06-29 DIAGNOSIS — Z79899 Other long term (current) drug therapy: Secondary | ICD-10-CM | POA: Diagnosis not present

## 2024-06-29 DIAGNOSIS — I4891 Unspecified atrial fibrillation: Secondary | ICD-10-CM

## 2024-06-29 DIAGNOSIS — R001 Bradycardia, unspecified: Secondary | ICD-10-CM | POA: Insufficient documentation

## 2024-06-29 DIAGNOSIS — I480 Paroxysmal atrial fibrillation: Secondary | ICD-10-CM | POA: Diagnosis not present

## 2024-06-29 DIAGNOSIS — Z7901 Long term (current) use of anticoagulants: Secondary | ICD-10-CM | POA: Insufficient documentation

## 2024-06-29 DIAGNOSIS — D6869 Other thrombophilia: Secondary | ICD-10-CM | POA: Insufficient documentation

## 2024-06-29 DIAGNOSIS — I484 Atypical atrial flutter: Secondary | ICD-10-CM | POA: Diagnosis not present

## 2024-06-29 HISTORY — PX: CARDIOVERSION: EP1203

## 2024-06-29 SURGERY — CARDIOVERSION (CATH LAB)
Anesthesia: General

## 2024-06-29 MED ORDER — EPHEDRINE SULFATE-NACL 50-0.9 MG/10ML-% IV SOSY
PREFILLED_SYRINGE | INTRAVENOUS | Status: DC | PRN
  Administered 2024-06-29 (×3): 5 mg via INTRAVENOUS
  Administered 2024-06-29: 10 mg via INTRAVENOUS

## 2024-06-29 MED ORDER — LIDOCAINE 2% (20 MG/ML) 5 ML SYRINGE
INTRAMUSCULAR | Status: DC | PRN
Start: 2024-06-29 — End: 2024-06-29
  Administered 2024-06-29: 80 mg via INTRAVENOUS

## 2024-06-29 MED ORDER — PROPOFOL 10 MG/ML IV BOLUS
INTRAVENOUS | Status: DC | PRN
  Administered 2024-06-29: 100 mg via INTRAVENOUS

## 2024-06-29 MED ORDER — SODIUM CHLORIDE 0.9 % IV SOLN: INTRAVENOUS | Status: DC

## 2024-06-29 SURGICAL SUPPLY — 1 items: PAD DEFIB RADIO PHYSIO CONN (PAD) ×1 IMPLANT

## 2024-06-29 NOTE — Anesthesia Postprocedure Evaluation (Signed)
 Anesthesia Post Note  Patient: James [name]  Procedure(s) Performed: CARDIOVERSION     Patient location during evaluation: PACU Anesthesia Type: General Level of consciousness: awake and alert Pain management: pain level controlled Vital Signs Assessment: post-procedure vital signs reviewed and stable Respiratory status: spontaneous breathing, nonlabored ventilation and respiratory function stable Cardiovascular status: blood pressure returned to baseline and stable Postop Assessment: no apparent nausea or vomiting Anesthetic complications: no   No notable events documented.  Last Vitals:  Vitals:   06/29/24 0850 06/29/24 0855  BP: (!) 99/54 (!) 93/56  Pulse: (!) 41 (!) 41  Resp: (!) 9 10  Temp:    SpO2: 99% 98%    Last Pain:  Vitals:   06/29/24 0820  TempSrc: Temporal  PainSc: 0-No pain                 Butler Levander Pinal

## 2024-06-29 NOTE — Progress Notes (Signed)
 S/p cardioversion, pt remains in junctional bradycardia. Asymptomatic and blood pressure recovered post-anesthesia. Dr Lonni & Dr [name] aware. Ambulated pt prior to discharge. No symptoms reported; BP 120/70 post-ambulation and HR 47. Instructed pt to hold metoprolol  per Dr. Eston orders.

## 2024-06-29 NOTE — Transfer of Care (Signed)
 Immediate Anesthesia Transfer of Care Note  Patient: James [name]  Procedure(s) Performed: CARDIOVERSION  Patient Location: PACU and Cath Lab  Anesthesia Type:MAC  Level of Consciousness: awake, alert , oriented, and patient cooperative  Airway & Oxygen Therapy: Patient Spontanous Breathing and Patient connected to nasal cannula oxygen  Post-op Assessment: Report given to RN, Post -op Vital signs reviewed and stable, Patient moving all extremities X 4, and Patient able to stick tongue midline  Post vital signs: Reviewed and stable  Last Vitals:  Vitals Value Taken Time  BP 92/56 0820  Temp 36.5   Pulse 44   Resp 16   SpO2 97     Last Pain:  Vitals:   06/29/24 0719  TempSrc:   PainSc: 0-No pain         Complications: No notable events documented.

## 2024-06-29 NOTE — Interval H&P Note (Signed)
 History and Physical Interval Note:  06/29/2024 7:56 AM  James [name]  has presented today for surgery, with the diagnosis of AFLUTTER.  The various methods of treatment have been discussed with the patient and family. After consideration of risks, benefits and other options for treatment, the patient has consented to  Procedure(s): CARDIOVERSION (N/A) as a surgical intervention.  The patient's history has been reviewed, patient examined, no change in status, stable for surgery.  I have reviewed the patient's chart and labs.  Questions were answered to the patient's satisfaction.     Macil Crady Lonni

## 2024-06-29 NOTE — CV Procedure (Signed)
 Procedure:   DCCV  Indication:  Symptomatic atrial fibrillation  Procedure Note:  The patient signed informed consent.  They have had had therapeutic anticoagulation with apixaban  greater than 3 weeks.  Anesthesia was administered by Dr. Cleotilde.  Patient received 80 mg IV lidocaine  and 100 mg IV propofol .Adequate airway was maintained throughout and vital followed per protocol.  They were cardioverted x 1 with 200J of biphasic synchronized energy and anterior pressure.  They converted, initially with 5.4 second pause, then junctional beat, then 4.6 second pause. He was initially in junctional rate in the 30s, and he slowly transferred into largely sinus bradycardia in the upper 40s with occasional junctional beats.  He was briefly hypotensive while very bradycardia and received ephedrine  and a 250 ml NS bolus. Once he was awake, his blood pressure and heart rate stabilized.  The patient had normal neuro status and respiratory status post procedure with vitals stable as recorded elsewhere.    Follow up:  They will continue on current medical therapy and follow up with cardiology as scheduled. Metoprolol  will be held until he can discuss with his primary cardiology provider  Shelda Bruckner, MD PhD 06/29/2024 8:21 AM

## 2024-06-29 NOTE — Anesthesia Procedure Notes (Signed)
 Procedure Name: MAC Date/Time: 06/29/2024 8:02 AM  Performed by: Viviana Almarie DASEN, CRNAPre-anesthesia Checklist: Patient identified, Suction available, Patient being monitored, Emergency Drugs available and Timeout performed Patient Re-evaluated:Patient Re-evaluated prior to induction Oxygen Delivery Method: Nasal cannula Induction Type: IV induction

## 2024-06-29 NOTE — Anesthesia Preprocedure Evaluation (Addendum)
 Anesthesia Evaluation  Patient identified by MRN, date of birth, ID band Patient awake    Reviewed: Allergy & Precautions, H&P , NPO status , Patient's Chart, lab work & pertinent test results  Airway Mallampati: III  TM Distance: >3 FB Neck ROM: Full    Dental no notable dental hx. (+) Teeth Intact, Dental Advisory Given   Pulmonary neg pulmonary ROS   Pulmonary exam normal breath sounds clear to auscultation       Cardiovascular hypertension, Pt. on medications negative cardio ROS Normal cardiovascular exam+ dysrhythmias Atrial Fibrillation + Valvular Problems/Murmurs (s/p MV repair 2014) MR  Rhythm:Irregular Rate:Normal  TEE 2025 1. Left ventricular ejection fraction, by estimation, is 40 to 45%. The  left ventricle has mildly decreased function.   2. Right ventricular systolic function is mildly reduced. The right  ventricular size is normal.   3. Left atrial size was severely dilated. No left atrial/left atrial  appendage thrombus was detected.   4. The mitral valve has been repaired/replaced. Trivial mitral valve  regurgitation. There is a 34 Mitral Memo 3D prosthetic annuloplasty ring  present in the mitral position. Procedure Date: 01/06/13.   5. The aortic valve is tricuspid. Aortic valve regurgitation is trivial.      Neuro/Psych negative neurological ROS  negative psych ROS   GI/Hepatic negative GI ROS, Neg liver ROS,GERD  ,,  Endo/Other  negative endocrine ROS    Renal/GU negative Renal ROS  negative genitourinary   Musculoskeletal negative musculoskeletal ROS (+)    Abdominal   Peds negative pediatric ROS (+)  Hematology negative hematology ROS (+) Blood dyscrasia (eliquis )   Anesthesia Other Findings   Reproductive/Obstetrics negative OB ROS                              Anesthesia Physical Anesthesia Plan  ASA: 3  Anesthesia Plan: General   Post-op Pain Management:     Induction: Intravenous  PONV Risk Score and Plan: Propofol  infusion and Treatment may vary due to age or medical condition  Airway Management Planned: Natural Airway  Additional Equipment:   Intra-op Plan:   Post-operative Plan:   Informed Consent: I have reviewed the patients History and Physical, chart, labs and discussed the procedure including the risks, benefits and alternatives for the proposed anesthesia with the patient or authorized representative who has indicated his/her understanding and acceptance.     Dental advisory given  Plan Discussed with: [name]  Anesthesia Plan Comments:         Anesthesia Quick Evaluation

## 2024-07-14 ENCOUNTER — Telehealth: Payer: Self-pay | Admitting: Cardiology

## 2024-07-14 ENCOUNTER — Other Ambulatory Visit: Payer: Self-pay | Admitting: Cardiovascular Disease

## 2024-07-14 NOTE — Telephone Encounter (Signed)
 Pt's medication was already sent to pt's pharmacy as requested. Confirmation received.

## 2024-07-14 NOTE — Telephone Encounter (Signed)
 Prescription refill request for Eliquis  received. Indication: AF Last office visit: 06/23/24  A Mealor MD Scr: 1.34 on 06/23/24  Epic Age: 78 Weight: 90.3kg  Based on above findings Eliquis  5mg  twice daily is the appropriate dose.  Refill approved.

## 2024-07-14 NOTE — Telephone Encounter (Signed)
*  STAT* If patient is at the pharmacy, call can be transferred to refill team.   1. Which medications need to be refilled? (please list name of each medication and dose if known)   apixaban (ELIQUIS) 5 MG TABS tablet    2. Which pharmacy/location (including street and city if local pharmacy) is medication to be sent to?   [facility], [place] - [address] AT [place]    3. Do they need a 30 day or 90 day supply? [place]

## 2024-07-19 ENCOUNTER — Telehealth (HOSPITAL_COMMUNITY): Payer: Self-pay

## 2024-07-19 ENCOUNTER — Encounter (HOSPITAL_COMMUNITY): Payer: Self-pay

## 2024-07-19 ENCOUNTER — Telehealth: Payer: Self-pay

## 2024-07-19 NOTE — Telephone Encounter (Signed)
-----   Message from Nurse Aldona R sent at 06/24/2024  5:03 PM EDT ----- Regarding: 11/10 Atypical Aflutter ablation - Mealor Important: list procedure date as first item in subject line, followed by procedure type (e.g., 06/11/24 AFib ablation)  Precert:  MD: Mealor Type of ablation: A-flutter Diagnosis: Atypical Aflutter CPT code: A-flutter (06346) Ablation scheduled (date/time): 11/10 930  Procedure:  Added to calendar? Yes Orders entered? No, >30 days before procedure Letter complete? No, >30 days before procedure Scheduled with cath lab? Yes Any medications to hold? Yes (please list hold instructions): Multaq  x 5 days Labs ordered (CBC, BMET, PT/INR if on warfarin): Yes Mapping system: CARTO (lab 4 or 6) CARTO/OPAL rep notified? Yes Cardiac CT needed? No Dye allergy? N/A Pre-meds ordered and instructions given? N/A Letter method: MyChart H&P: 9/24 Device: No  Follow-up:  Cassie/Angel, please schedule Routine.  Covering RN - please send this message to [employer], EP scheduler, EP Scheduling pool, EP [employer], and CT scheduler ([place]/Stephanie Mogg), if indicated.

## 2024-07-19 NOTE — Telephone Encounter (Signed)
 Spoke with patient to complete pre-procedure call.     Health status review:  Any new medical conditions, recent signs of acute illness or been started on antibiotics? No Any recent hospitalizations or surgeries? No Any new medications started since pre-op  visit? No  Follow all medication instructions prior to procedure or the procedure may be rescheduled:    Continue taking Eliquis  (Apixaban ) twice daily without missing any doses before procedure. Essential chronic medications:  No medication should be continued, unless told otherwise.  HOLD Dronedarone  (Multaq ) for 5 days prior to procedure. Last dose on Tuesday, November 04.  On the morning of your procedure DO NOT take any medication., including Eliquis  (Apixaban ).  Nothing to eat or drink after midnight prior to your procedure.  Pre-procedure testing scheduled: CT not needed,  lab work on October 28.  Confirmed patient is scheduled for Atrial Flutter Ablation on Monday, November 10 with Dr. Dr. [name]. Instructed patient to arrive at the Main Entrance A at [facility]: [address] [place], [place] [address] and check in at Admitting at 7:30 AM.  Plan to go home the same day, you will only stay overnight if medically necessary.. You MUST have a responsible adult to drive you home and MUST be with you the first 24 hours after you arrive home or your procedure could be cancelled.  Informed patient a nurse will call a day before the procedure to confirm arrival time and ensure instructions are followed.  Patient verbalized understanding to information provided and is agreeable to proceed with procedure.   Advised patient to contact RN Navigator at [phone], to inform of any new medications started after call or concerns prior to procedure.

## 2024-07-26 ENCOUNTER — Telehealth: Payer: Self-pay | Admitting: Cardiovascular Disease

## 2024-07-26 NOTE — Progress Notes (Unsigned)
 Electrophysiology Office Note:   Date:  07/27/2024  ID:  James [name], DOB 1946-03-05, MRN [id number]  Primary Cardiologist: [name], MD Primary Heart Failure: None Electrophysiologist: [name], MD      History of Present Illness:   James [name] is a 78 y.o. male with h/o mitral stenosis s/p MV repair (2014), persistent AF, prior AFL s/p ablation, occasional ETOH seen today for routine electrophysiology followup.   He saw Dr. Furbish on 06/23/24 and EKG showed AT vs atypical atrial flutter. He was started on Multaq  and planned for DCCV. He was planned for repeat EPS. He underwent DCCV 06/29/24 with 200j + anterior pressure and he converted to NSR with 5.4 second pause initially > junctional beat and then 4.6 second pause.  He continued with junctional rhythm then slowly transitioned to SB.    Since last being seen in our clinic the patient reports he has been feeling fatigued, lightheaded with low energy.  He previously was told to stop taking his Toprol  and has been off of it approximately 6 weeks.  His blood pressure in clinic today is on the low side and he reports that he has been running in the 90s to 100s at home.   He  denies chest pain, palpitations, dyspnea, PND, orthopnea, nausea, vomiting, dizziness, syncope, edema, weight gain, or early satiety.   Review of systems complete and found to be negative unless listed in HPI.   EP Information / Studies Reviewed:    EKG is ordered today. Personal review as below.  EKG Interpretation Date/Time:  Tuesday July 27 2024 13:53:31 EDT Ventricular Rate:  112 PR Interval:    QRS Duration:  92 QT Interval:  346 QTC Calculation: 472 R Axis:   -52  Text Interpretation: AT with Mobitz I vs AFL Left axis deviation Confirmed by Aniceto Jarvis ([id number]) on 07/27/2024 4:14:51 PM   Arrhythmia / AAD Persistent AF > two persistent episodes in past -  one precipitated possibly by excessive alcohol and missing medications, the other  by gall bladder surgery  DCCV 01/2023 200j, NSR DCCV 07/2023 200j, NSR > ERAF after 2.5 days AFL s/p Ablation in 2018 Amiodarone  > initiated 08/2023 with AF > stopped 02/2024 due to low HR / dizziness EPS 10/31/23 > AF on presentation, PVI ablation with PF energy, ablation of PW with PF energy, junctional rhythm post DCCV DCCV 12/30/23 > 200j x1> NSR  DCCV 02/04/24 > 200 j x1 > SB EPS 05/17/24 > atypical AFL on presentation, ablation of an atypical perimitral flutter with a septal mitral isthmus line DCCV 06/29/24 > 200j + anterior pressure > converted to NSR with 5.4 second pause initially > junctional beat and then 4.6 second pause.  He continued with junctional rhythm then slowly transitioned to SB.    Risk Assessment/Calculations:    CHA2DS2-VASc Score = 3   This indicates a 3.2% annual risk of stroke. The patient's score is based upon: CHF History: 0 HTN History: 1 Diabetes History: 0 Stroke History: 0 Vascular Disease History: 0 Age Score: 2 Gender Score: 0             Physical Exam:   VS:  BP (!) 88/44   Pulse (!) 112   Ht 5' 11 (1.803 m)   Wt 203 lb (92.1 kg)   SpO2 96%   BMI 28.31 kg/m    Wt Readings from Last 3 Encounters:  07/27/24 203 lb (92.1 kg)  06/23/24 199 lb (90.3 kg)  05/19/24 200 lb 6.4  oz (90.9 kg)     GEN: Well nourished, well developed in no acute distress NECK: No JVD; No carotid bruits CARDIAC: irregular rhythm / grouped beating, no murmurs, rubs, gallops RESPIRATORY:  Clear to auscultation without rales, wheezing or rhonchi  ABDOMEN: Soft, non-tender, non-distended EXTREMITIES:  No edema; No deformity   ASSESSMENT AND PLAN:    Persistent Atrial Fibrillation  Atypical Atrial Flutter / Atypical Peri-Mitral Flutter  CHA2DS2-VASc 3 / 3.2% annual risk CVA, DCCV 07/2023 with NSR > ERAF after 2.5 days. S/p AFL ablation 05/17/24 -OAC for stroke prophylaxis  -continue Multaq  400 mg BID  -EKG with AT and Mobitz I vs AFL  -pt not on Toprol , continue to  hold for now  -pre-procedure labs > BMP, CBC  -pt pending AFL ablation on 110/10/25 with Dr. [name]   Secondary Hypercoagulable State  -continue Eliquis  5mg  BID, dose reviewed and appropriate by age /wt  Sinus Bradycardia  -asymptomatic, monitor  -caution with anti-chronotropic medications  VHD: MS s/p MV Repair  -mild MS  Hypertension  -patient with low BP in clinic (88/44), instructed to reduce telmisartan  to 20 mg daily & follow BP trend over the next week and send to his PCP -discussed mild liberalization of additional salt in diet  ETOH Use  -pt typically has 1-2 glasses of red wine with dinner > discussed limiting intake given his atrial arrhythmia hx   Follow up with Dr. [name] as planned for AFL ablation 08/09/24   Signed, Daphne Barrack, NP-C, AGACNP-BC [facility] - Electrophysiology  07/27/2024, 4:15 PM

## 2024-07-26 NOTE — Telephone Encounter (Signed)
 Please advise if okay for patient to travel?

## 2024-07-26 NOTE — Telephone Encounter (Signed)
 Patient says he [place] the beach the week before 11/10 ablation. He comes back in town on 11/09. He is aware he is supposed to hold Multaq  1 week prior. He would like to know if he should stay home. He says his HR is normally 120-130 bpm when he's off the medication. Please advise.

## 2024-07-26 NOTE — Telephone Encounter (Signed)
 Mealor, James BRAVO, MD to [facility] Triage (Selected Message) AM    07/26/24 10:49 AM I think he probably should stay close to home if heart rates tend to be that elevated.  S/w the patient and gave the information above. He verbalized understanding.

## 2024-07-26 NOTE — H&P (View-Only) (Signed)
 Electrophysiology Office Note:   Date:  07/27/2024  ID:  James [name], DOB 1946-03-05, MRN [id number]  Primary Cardiologist: [name], MD Primary Heart Failure: None Electrophysiologist: [name], MD      History of Present Illness:   Lael Pilch is a 78 y.o. male with h/o mitral stenosis s/p MV repair (2014), persistent AF, prior AFL s/p ablation, occasional ETOH seen today for routine electrophysiology followup.   He saw Dr. Furbish on 06/23/24 and EKG showed AT vs atypical atrial flutter. He was started on Multaq  and planned for DCCV. He was planned for repeat EPS. He underwent DCCV 06/29/24 with 200j + anterior pressure and he converted to NSR with 5.4 second pause initially > junctional beat and then 4.6 second pause.  He continued with junctional rhythm then slowly transitioned to SB.    Since last being seen in our clinic the patient reports he has been feeling fatigued, lightheaded with low energy.  He previously was told to stop taking his Toprol  and has been off of it approximately 6 weeks.  His blood pressure in clinic today is on the low side and he reports that he has been running in the 90s to 100s at home.   He  denies chest pain, palpitations, dyspnea, PND, orthopnea, nausea, vomiting, dizziness, syncope, edema, weight gain, or early satiety.   Review of systems complete and found to be negative unless listed in HPI.   EP Information / Studies Reviewed:    EKG is ordered today. Personal review as below.  EKG Interpretation Date/Time:  Tuesday July 27 2024 13:53:31 EDT Ventricular Rate:  112 PR Interval:    QRS Duration:  92 QT Interval:  346 QTC Calculation: 472 R Axis:   -52  Text Interpretation: AT with Mobitz I vs AFL Left axis deviation Confirmed by Aniceto Jarvis ([id number]) on 07/27/2024 4:14:51 PM   Arrhythmia / AAD Persistent AF > two persistent episodes in past -  one precipitated possibly by excessive alcohol and missing medications, the other  by gall bladder surgery  DCCV 01/2023 200j, NSR DCCV 07/2023 200j, NSR > ERAF after 2.5 days AFL s/p Ablation in 2018 Amiodarone  > initiated 08/2023 with AF > stopped 02/2024 due to low HR / dizziness EPS 10/31/23 > AF on presentation, PVI ablation with PF energy, ablation of PW with PF energy, junctional rhythm post DCCV DCCV 12/30/23 > 200j x1> NSR  DCCV 02/04/24 > 200 j x1 > SB EPS 05/17/24 > atypical AFL on presentation, ablation of an atypical perimitral flutter with a septal mitral isthmus line DCCV 06/29/24 > 200j + anterior pressure > converted to NSR with 5.4 second pause initially > junctional beat and then 4.6 second pause.  He continued with junctional rhythm then slowly transitioned to SB.    Risk Assessment/Calculations:    CHA2DS2-VASc Score = 3   This indicates a 3.2% annual risk of stroke. The patient's score is based upon: CHF History: 0 HTN History: 1 Diabetes History: 0 Stroke History: 0 Vascular Disease History: 0 Age Score: 2 Gender Score: 0             Physical Exam:   VS:  BP (!) 88/44   Pulse (!) 112   Ht 5' 11 (1.803 m)   Wt 203 lb (92.1 kg)   SpO2 96%   BMI 28.31 kg/m    Wt Readings from Last 3 Encounters:  07/27/24 203 lb (92.1 kg)  06/23/24 199 lb (90.3 kg)  05/19/24 200 lb 6.4  oz (90.9 kg)     GEN: Well nourished, well developed in no acute distress NECK: No JVD; No carotid bruits CARDIAC: irregular rhythm / grouped beating, no murmurs, rubs, gallops RESPIRATORY:  Clear to auscultation without rales, wheezing or rhonchi  ABDOMEN: Soft, non-tender, non-distended EXTREMITIES:  No edema; No deformity   ASSESSMENT AND PLAN:    Persistent Atrial Fibrillation  Atypical Atrial Flutter / Atypical Peri-Mitral Flutter  CHA2DS2-VASc 3 / 3.2% annual risk CVA, DCCV 07/2023 with NSR > ERAF after 2.5 days. S/p AFL ablation 05/17/24 -OAC for stroke prophylaxis  -continue Multaq  400 mg BID  -EKG with AT and Mobitz I vs AFL  -pt not on Toprol , continue to  hold for now  -pre-procedure labs > BMP, CBC  -pt pending AFL ablation on 110/10/25 with Dr. [name]   Secondary Hypercoagulable State  -continue Eliquis  5mg  BID, dose reviewed and appropriate by age /wt  Sinus Bradycardia  -asymptomatic, monitor  -caution with anti-chronotropic medications  VHD: MS s/p MV Repair  -mild MS  Hypertension  -patient with low BP in clinic (88/44), instructed to reduce telmisartan  to 20 mg daily & follow BP trend over the next week and send to his PCP -discussed mild liberalization of additional salt in diet  ETOH Use  -pt typically has 1-2 glasses of red wine with dinner > discussed limiting intake given his atrial arrhythmia hx   Follow up with Dr. [name] as planned for AFL ablation 08/09/24   Signed, Daphne Barrack, NP-C, AGACNP-BC [facility] - Electrophysiology  07/27/2024, 4:15 PM

## 2024-07-27 ENCOUNTER — Encounter: Payer: Self-pay | Admitting: Pulmonary Disease

## 2024-07-27 ENCOUNTER — Ambulatory Visit: Attending: Pulmonary Disease | Admitting: Pulmonary Disease

## 2024-07-27 VITALS — BP 88/44 | HR 112 | Ht 71.0 in | Wt 203.0 lb

## 2024-07-27 DIAGNOSIS — I484 Atypical atrial flutter: Secondary | ICD-10-CM | POA: Diagnosis not present

## 2024-07-27 DIAGNOSIS — R001 Bradycardia, unspecified: Secondary | ICD-10-CM

## 2024-07-27 DIAGNOSIS — I4819 Other persistent atrial fibrillation: Secondary | ICD-10-CM | POA: Diagnosis not present

## 2024-07-27 DIAGNOSIS — D6869 Other thrombophilia: Secondary | ICD-10-CM | POA: Diagnosis not present

## 2024-07-27 MED ORDER — TELMISARTAN 40 MG PO TABS: 20.0000 mg | ORAL_TABLET | Freq: Every day | ORAL | 1 refills | Status: AC

## 2024-07-27 NOTE — Patient Instructions (Signed)
 Medication Instructions:  Decrease telmisartan  to 1/2 tablet (20 mg) daily *If you need a refill on your cardiac medications before your next appointment, please call your pharmacy*  Lab Work: BMET, CBC-TODAY If you have labs (blood work) drawn today and your tests are completely normal, you will receive your results only by: MyChart Message (if you have MyChart) OR A paper copy in the mail If you have any lab test that is abnormal or we need to change your treatment, we will call you to review the results.  Testing/Procedures: See letter  Follow-Up: At [facility], you and your health needs are our priority.  As part of our continuing mission to provide you with exceptional heart care, our providers are all part of one team.  This team includes your primary Cardiologist (physician) and Advanced Practice Providers or APPs (Physician Assistants and Nurse Practitioners) who all work together to provide you with the care you need, when you need it.  Your next appointment:   Follow up will be arranged for you.

## 2024-07-28 ENCOUNTER — Ambulatory Visit: Payer: Self-pay | Admitting: Pulmonary Disease

## 2024-07-28 LAB — BASIC METABOLIC PANEL WITH GFR
BUN/Creatinine Ratio: 19 (ref 10–24)
BUN: 24 mg/dL (ref 8–27)
CO2: 17 mmol/L — AB (ref 20–29)
Calcium: 9.1 mg/dL (ref 8.6–10.2)
Chloride: 99 mmol/L (ref 96–106)
Creatinine, Ser: 1.27 mg/dL (ref 0.76–1.27)
Glucose: 90 mg/dL (ref 70–99)
Potassium: 4.7 mmol/L (ref 3.5–5.2)
Sodium: 135 mmol/L (ref 134–144)
eGFR: 58 mL/min/1.73 — AB (ref >59)

## 2024-07-28 LAB — CBC
Hematocrit: 38.9 % (ref 37.5–51.0)
Hemoglobin: 13.2 g/dL (ref 13.0–17.7)
MCH: 35.5 pg — ABNORMAL HIGH (ref 26.6–33.0)
MCHC: 33.9 g/dL (ref 31.5–35.7)
MCV: 105 fL — ABNORMAL HIGH (ref 79–97)
Platelets: 167 x10E3/uL (ref 150–450)
RBC: 3.72 x10E6/uL — ABNORMAL LOW (ref 4.14–5.80)
RDW: 12.5 % (ref 11.6–15.4)
WBC: 4.6 x10E3/uL (ref 3.4–10.8)

## 2024-08-08 NOTE — Pre-Procedure Instructions (Signed)
 Instructed patient on the following items: Arrival time 0730 Nothing to eat or drink after midnight No meds AM of procedure Responsible person to drive you home and stay with you for 24 hrs  Have you missed any doses of anti-coagulant Eliquis - takes twice a day, hasn't missed any dose in last 4 weeks.  Don't take dose morning of procedure.  Last Multaq  dose was 11/4.

## 2024-08-09 ENCOUNTER — Other Ambulatory Visit: Payer: Self-pay

## 2024-08-09 ENCOUNTER — Ambulatory Visit (HOSPITAL_COMMUNITY): Admitting: Certified Registered Nurse Anesthetist

## 2024-08-09 ENCOUNTER — Encounter (HOSPITAL_COMMUNITY)
Admission: RE | Disposition: A | Discharge status: Home / Self Care | Payer: Self-pay | Source: Home / Self Care | Attending: Cardiovascular Disease

## 2024-08-09 ENCOUNTER — Ambulatory Visit (HOSPITAL_COMMUNITY)
Admission: RE | Admit: 2024-08-09 | Discharge: 2024-08-09 | Disposition: A | Discharge status: Home / Self Care | Attending: Cardiovascular Disease | Admitting: Cardiovascular Disease

## 2024-08-09 DIAGNOSIS — Z7901 Long term (current) use of anticoagulants: Secondary | ICD-10-CM | POA: Insufficient documentation

## 2024-08-09 DIAGNOSIS — I10 Essential (primary) hypertension: Secondary | ICD-10-CM | POA: Diagnosis not present

## 2024-08-09 DIAGNOSIS — I4892 Unspecified atrial flutter: Secondary | ICD-10-CM | POA: Insufficient documentation

## 2024-08-09 DIAGNOSIS — I484 Atypical atrial flutter: Secondary | ICD-10-CM

## 2024-08-09 DIAGNOSIS — K219 Gastro-esophageal reflux disease without esophagitis: Secondary | ICD-10-CM | POA: Diagnosis not present

## 2024-08-09 DIAGNOSIS — Z79899 Other long term (current) drug therapy: Secondary | ICD-10-CM | POA: Insufficient documentation

## 2024-08-09 DIAGNOSIS — D6869 Other thrombophilia: Secondary | ICD-10-CM | POA: Insufficient documentation

## 2024-08-09 DIAGNOSIS — F1090 Alcohol use, unspecified, uncomplicated: Secondary | ICD-10-CM | POA: Diagnosis not present

## 2024-08-09 DIAGNOSIS — I4819 Other persistent atrial fibrillation: Secondary | ICD-10-CM | POA: Diagnosis not present

## 2024-08-09 HISTORY — PX: A-FLUTTER ABLATION: EP1230

## 2024-08-09 LAB — POCT ACTIVATED CLOTTING TIME
Activated Clotting Time: 291 s
Activated Clotting Time: 279 s

## 2024-08-09 SURGERY — A-FLUTTER ABLATION
Anesthesia: General

## 2024-08-09 MED ORDER — PROTAMINE SULFATE 10 MG/ML IV SOLN
INTRAVENOUS | Status: DC | PRN
  Administered 2024-08-09: 50 mg via INTRAVENOUS

## 2024-08-09 MED ORDER — SODIUM CHLORIDE 0.9 % IV SOLN: INTRAVENOUS | Status: DC

## 2024-08-09 MED ORDER — VASOPRESSIN 20 UNIT/ML IV SOLN
INTRAVENOUS | Status: DC | PRN
  Administered 2024-08-09: .5 [IU] via INTRAVENOUS

## 2024-08-09 MED ORDER — HEPARIN SODIUM (PORCINE) 1000 UNIT/ML IJ SOLN
INTRAMUSCULAR | Status: DC | PRN
  Administered 2024-08-09 (×2): 3000 [IU] via INTRAVENOUS
  Administered 2024-08-09: 14000 [IU] via INTRAVENOUS

## 2024-08-09 MED ORDER — HEPARIN SODIUM (PORCINE) 1000 UNIT/ML IJ SOLN
INTRAMUSCULAR | Status: AC
  Filled 2024-08-09: qty 10

## 2024-08-09 MED ORDER — HEPARIN (PORCINE) IN NACL 1000-0.9 UT/500ML-% IV SOLN
INTRAVENOUS | Status: DC | PRN
  Administered 2024-08-09 (×3): 500 mL

## 2024-08-09 MED ORDER — ROCURONIUM BROMIDE 10 MG/ML (PF) SYRINGE
PREFILLED_SYRINGE | INTRAVENOUS | Status: DC | PRN
  Administered 2024-08-09: 50 mg via INTRAVENOUS
  Administered 2024-08-09: 30 mg via INTRAVENOUS

## 2024-08-09 MED ORDER — PHENYLEPHRINE HCL-NACL 20-0.9 MG/250ML-% IV SOLN
INTRAVENOUS | Status: DC | PRN
  Administered 2024-08-09: 40 ug/min via INTRAVENOUS

## 2024-08-09 MED ORDER — DEXAMETHASONE SOD PHOSPHATE PF 10 MG/ML IJ SOLN
INTRAMUSCULAR | Status: DC | PRN
  Administered 2024-08-09: 5 mg via INTRAVENOUS

## 2024-08-09 MED ORDER — ACETAMINOPHEN 325 MG PO TABS: 650.0000 mg | ORAL_TABLET | ORAL | Status: DC | PRN

## 2024-08-09 MED ORDER — PROPOFOL 10 MG/ML IV BOLUS
INTRAVENOUS | Status: DC | PRN
  Administered 2024-08-09: 50 mg via INTRAVENOUS
  Administered 2024-08-09: 120 mg via INTRAVENOUS
  Administered 2024-08-09: 30 mg via INTRAVENOUS

## 2024-08-09 MED ORDER — SODIUM CHLORIDE 0.9 % IV SOLN: 250.0000 mL | INTRAVENOUS | Status: DC | PRN

## 2024-08-09 MED ORDER — SODIUM CHLORIDE 0.9% FLUSH: 3.0000 mL | Freq: Two times a day (BID) | INTRAVENOUS | Status: DC

## 2024-08-09 MED ORDER — FENTANYL CITRATE (PF) 250 MCG/5ML IJ SOLN
INTRAMUSCULAR | Status: DC | PRN
  Administered 2024-08-09: 50 ug via INTRAVENOUS

## 2024-08-09 MED ORDER — ONDANSETRON HCL 4 MG/2ML IJ SOLN
INTRAMUSCULAR | Status: DC | PRN
  Administered 2024-08-09: 4 mg via INTRAVENOUS

## 2024-08-09 MED ORDER — LIDOCAINE 2% (20 MG/ML) 5 ML SYRINGE
INTRAMUSCULAR | Status: DC | PRN
  Administered 2024-08-09: 80 mg via INTRAVENOUS

## 2024-08-09 MED ORDER — ONDANSETRON HCL 4 MG/2ML IJ SOLN
4.0000 mg | Freq: Four times a day (QID) | INTRAMUSCULAR | Status: DC | PRN
Start: 2024-08-09 — End: 2024-08-09

## 2024-08-09 MED ORDER — PHENYLEPHRINE 80 MCG/ML (10ML) SYRINGE FOR IV PUSH (FOR BLOOD PRESSURE SUPPORT)
PREFILLED_SYRINGE | INTRAVENOUS | Status: DC | PRN
  Administered 2024-08-09: 160 ug via INTRAVENOUS
  Administered 2024-08-09 (×3): 80 ug via INTRAVENOUS
  Administered 2024-08-09: 240 ug via INTRAVENOUS

## 2024-08-09 MED ORDER — HEPARIN SODIUM (PORCINE) 1000 UNIT/ML IJ SOLN
INTRAMUSCULAR | Status: DC | PRN
  Administered 2024-08-09: 1000 [IU] via INTRAVENOUS

## 2024-08-09 MED ORDER — SODIUM CHLORIDE 0.9% FLUSH: 3.0000 mL | INTRAVENOUS | Status: DC | PRN

## 2024-08-09 MED ORDER — EPHEDRINE SULFATE-NACL 50-0.9 MG/10ML-% IV SOSY
PREFILLED_SYRINGE | INTRAVENOUS | Status: DC | PRN
  Administered 2024-08-09: 15 mg via INTRAVENOUS

## 2024-08-09 SURGICAL SUPPLY — 19 items
BAG SNAP BAND KOVER 36X36 (MISCELLANEOUS) IMPLANT
CATH ABLAT QDOT MICRO BI TC FJ (CATHETERS) IMPLANT
CATH EZ STEER NAV 8MM D-F CUR (ABLATOR) IMPLANT
CATH EZ STEER NAV 8MM F-J CUR (ABLATOR) IMPLANT
CATH GE 8FR SOUNDSTAR (CATHETERS) IMPLANT
CATH OCTARAY 2.0 F 3-3-3-3-3 (CATHETERS) IMPLANT
CATH WEBSTER BI DIR CS D-F CRV (CATHETERS) IMPLANT
CLOSURE MYNX CONTROL 6F/7F (Vascular Products) IMPLANT
CLOSURE PERCLOSE PROSTYLE (Vascular Products) IMPLANT
DEVICE CLOSURE MYNXGRIP 5F (Vascular Products) IMPLANT
KIT VERSACROSS 8.5F 63 45D 180 (KITS) IMPLANT
PACK EP LF (CUSTOM PROCEDURE TRAY) ×1 IMPLANT
PAD DEFIB RADIO PHYSIO CONN (PAD) ×1 IMPLANT
PATCH CARTO3 (PAD) IMPLANT
SHEATH CARTO VIZIGO MED CURVE (SHEATH) IMPLANT
SHEATH PINNACLE 8F 10CM (SHEATH) IMPLANT
SHEATH PINNACLE 9F 10CM (SHEATH) IMPLANT
SHEATH PROBE COVER 6X72 (BAG) IMPLANT
TUBING SMART ABLATE COOLFLOW (TUBING) IMPLANT

## 2024-08-09 NOTE — Progress Notes (Signed)
 Discharge instructions reviewed with patient and wife at bedside. Denies questions concerns. PT tolerated PO intake. Ambulated in the hallway, was able to void without difficulty. Seen by MD incision site remains clean dry and intact. No s/s of complications. PT escorted from the unit via wheel chair to personal vehicle.

## 2024-08-09 NOTE — Anesthesia Preprocedure Evaluation (Addendum)
 Anesthesia Evaluation  Patient identified by MRN, date of birth, ID band Patient awake    Reviewed: Allergy & Precautions, NPO status , Patient's Chart, lab work & pertinent test results, reviewed documented beta blocker date and time   Airway Mallampati: III  TM Distance: >3 FB Neck ROM: Full    Dental no notable dental hx. (+) Teeth Intact, Dental Advisory Given   Pulmonary neg pulmonary ROS   Pulmonary exam normal breath sounds clear to auscultation       Cardiovascular hypertension, Pt. on home beta blockers and Pt. on medications Normal cardiovascular exam+ dysrhythmias (eliquis ) Atrial Fibrillation + Valvular Problems/Murmurs (s/p MV repair)  Rhythm:Regular Rate:Normal  TEE 2025 1. Left ventricular ejection fraction, by estimation, is 40 to 45%. The  left ventricle has mildly decreased function.   2. Right ventricular systolic function is mildly reduced. The right  ventricular size is normal.   3. Left atrial size was severely dilated. No left atrial/left atrial  appendage thrombus was detected.   4. The mitral valve has been repaired/replaced. Trivial mitral valve  regurgitation. There is a 34 Mitral Memo 3D prosthetic annuloplasty ring  present in the mitral position. Procedure Date: 01/06/13.   5. The aortic valve is tricuspid. Aortic valve regurgitation is trivial.     Neuro/Psych negative neurological ROS  negative psych ROS   GI/Hepatic Neg liver ROS,GERD  ,,  Endo/Other  negative endocrine ROS    Renal/GU negative Renal ROS  negative genitourinary   Musculoskeletal negative musculoskeletal ROS (+)    Abdominal   Peds  Hematology negative hematology ROS (+)   Anesthesia Other Findings   Reproductive/Obstetrics                              Anesthesia Physical Anesthesia Plan  ASA: 3  Anesthesia Plan: General   Post-op Pain Management: Minimal or no pain anticipated    Induction: Intravenous  PONV Risk Score and Plan: 2 and Dexamethasone , Ondansetron  and Treatment may vary due to age or medical condition  Airway Management Planned: Oral ETT  Additional Equipment:   Intra-op Plan:   Post-operative Plan: Extubation in OR  Informed Consent: I have reviewed the patients History and Physical, chart, labs and discussed the procedure including the risks, benefits and alternatives for the proposed anesthesia with the patient or authorized representative who has indicated his/her understanding and acceptance.     Dental advisory given  Plan Discussed with: [name]  Anesthesia Plan Comments:         Anesthesia Quick Evaluation

## 2024-08-09 NOTE — Transfer of Care (Signed)
 Immediate Anesthesia Transfer of Care Note  Patient: James [name]  Procedure(s) Performed: A-FLUTTER ABLATION  Patient Location: PACU  Anesthesia Type:General  Level of Consciousness: awake, alert , and oriented  Airway & Oxygen Therapy: Patient Spontanous Breathing and Patient connected to nasal cannula oxygen  Post-op Assessment: Report given to RN and Post -op Vital signs reviewed and stable  Post vital signs: Reviewed and stable  Last Vitals:  Vitals Value Taken Time  BP    Temp    Pulse    Resp    SpO2      Last Pain:  Vitals:   08/09/24 0803  TempSrc: Oral  PainSc:          Complications: No notable events documented.

## 2024-08-09 NOTE — Anesthesia Procedure Notes (Signed)
 Procedure Name: Intubation Date/Time: 08/09/2024 10:15 AM  Performed by: Salina Leandrew RAMAN, CRNAPre-anesthesia Checklist: Patient identified, Emergency Drugs available, Suction available and Patient being monitored Patient Re-evaluated:Patient Re-evaluated prior to induction Oxygen Delivery Method: Circle System Utilized Preoxygenation: Pre-oxygenation with 100% oxygen Induction Type: IV induction Ventilation: Mask ventilation without difficulty Laryngoscope Size: Miller and 2 Grade View: Grade I Tube type: Oral Tube size: 7.5 mm Number of attempts: 1 Airway Equipment and Method: Stylet and Oral airway Placement Confirmation: ETT inserted through vocal cords under direct vision, positive ETCO2 and breath sounds checked- equal and bilateral Secured at: 22 cm Tube secured with: Tape Dental Injury: Teeth and Oropharynx as per pre-operative assessment

## 2024-08-09 NOTE — Interval H&P Note (Signed)
 History and Physical Interval Note:  08/09/2024 9:22 AM  James [name]  has presented today for surgery, with the diagnosis of Atypical Aflutter.  The various methods of treatment have been discussed with the patient and family. After consideration of risks, benefits and other options for treatment, the patient has consented to  Procedure(s): A-FLUTTER ABLATION (N/A) as a surgical intervention.  The patient's history has been reviewed, patient examined, no change in status, stable for surgery.  I have reviewed the patient's chart and labs.  Questions were answered to the patient's satisfaction.     Avante Carneiro E Stevens Magwood

## 2024-08-09 NOTE — Discharge Instructions (Signed)

## 2024-08-10 ENCOUNTER — Encounter (HOSPITAL_COMMUNITY): Payer: Self-pay | Admitting: Cardiovascular Disease

## 2024-08-10 ENCOUNTER — Telehealth (HOSPITAL_COMMUNITY): Payer: Self-pay

## 2024-08-10 NOTE — Anesthesia Postprocedure Evaluation (Signed)
 Anesthesia Post Note  Patient: James [name]  Procedure(s) Performed: A-FLUTTER ABLATION     Patient location during evaluation: PACU Anesthesia Type: General Level of consciousness: awake and alert Pain management: pain level controlled Vital Signs Assessment: post-procedure vital signs reviewed and stable Respiratory status: spontaneous breathing, nonlabored ventilation, respiratory function stable and patient connected to nasal cannula oxygen Cardiovascular status: blood pressure returned to baseline and stable Postop Assessment: no apparent nausea or vomiting Anesthetic complications: no   No notable events documented.  Last Vitals:  Vitals:   08/09/24 1500 08/09/24 1601  BP: 104/67 125/73  Pulse: 65 64  Resp: 20 15  Temp:    SpO2: 91% 99%    Last Pain:  Vitals:   08/09/24 1601  TempSrc:   PainSc: 0-No pain                 Axavier Pressley L Carlin Mamone

## 2024-08-10 NOTE — Telephone Encounter (Signed)
 Spoke with patient to complete post procedure follow up call.  Patient reports no complications with groin sites.   Instructions reviewed with patient:  Remove large bandage at puncture site after 24 hours. It is normal to have bruising, tenderness, mild swelling, and a pea or marble sized lump/knot at the groin site which can take up to three months to resolve.  Get help right away if you notice sudden swelling at the puncture site.  Check your puncture site every day for signs of infection: fever, redness, swelling, pus drainage, warmth, foul odor or excessive pain. If this occurs, please call [phone], to speak with the RN Navigator. Get help right away if your puncture site is bleeding and the bleeding does not stop after applying firm pressure to the area.  You may continue to have skipped beats/ atrial flutter during the first several months after your procedure.  It is very important not to miss any doses of your blood thinner Eliquis .    You will follow up with the APP 4 weeks after your procedure.  Activity restrictions reviewed.  Patient inquired if he should continue medications as before procedure. Advised patient he is to stop taking dronedarone , but continue all other medications as before.   Patient verbalized understanding to all instructions provided.

## 2024-08-12 ENCOUNTER — Ambulatory Visit: Admitting: Cardiovascular Disease

## 2024-09-07 NOTE — Progress Notes (Unsigned)
 Cardiology Office Note:  .   Date:  09/07/2024  ID:  James [name], DOB 06-Aug-1946, MRN [id number] PCP: [name] LABOR, MD  [facility] Providers Cardiologist:  [name], MD Electrophysiologist:  [name], MD {  History of Present Illness: .   James [name] is a 78 y.o. male w/PMHx of  HTN, HLD, barret's esophagus MV repair (2014) AFib AFlutter  08/09/24 ablation atypical LA flutter  Today's visit is scheduled as his 1 mo post AFlutter ablation visit ROS:   *** hx of brady.... *** eliquis , dose, bleeding, labs *** symptoms, burden *** Tikosyn next *** Most recent echo (TEE) was 40-45% >> Jan 2025 > repeat  Over due F/u with skains/team  Arrhythmia/AAD hx AFlutter ablation 2019 w/Dr. [name]) Recurrent of AFlutter and AFib ~ 2023 Amiodarone  ~ Dec 2024 AFib ablation 10/31/23 > CV during his procedure > junctional rhythm Amiodarone  dose reduced  >> stopped June 2025 (2/2 bradycardia) Atypical flutter 05/17/24: ablation of atypical perimitral AFlutter 08/09/24 ablation atypical LA flutter   Studies Reviewed: SABRA    EKG done today and reviewed by myself:  ***  08/09/24: EPS/ablation CONCLUSIONS: 1. Successful ablation of an atypical flutter involving the left atrium 3. Intracardiac echo reveals no effusion 4. No early apparent complications.  05/17/24: EPS/ablation CONCLUSIONS: 1. Atypical atrial flutter upon presentation.   2. Successful mapping and ablation of an atypical perimitral flutter with a septal mitral isthmus line 3. Durable ablation of the pulmonary veins, posterior wall of the left atrium, and the CTI 4. No early apparent complications.  10/31/23: EPS/ablation CONCLUSIONS: 1. Atrial fibrillation upon presentation.   2. Successful ablation of all four pulmonary veins with pulsed field energy. 3. Successful ablation of the posterior wall of the left atrium with pulsed field energy 4. No inducible arrhythmias following ablation   5. Junctional rhythm following DCCV 6. No early apparent complications.   10/31/23: TEE  1. Left ventricular ejection fraction, by estimation, is 40 to 45%. The  left ventricle has mildly decreased function.   2. Right ventricular systolic function is mildly reduced. The right  ventricular size is normal.   3. Left atrial size was severely dilated. No left atrial/left atrial  appendage thrombus was detected.   4. The mitral valve has been repaired/replaced. Trivial mitral valve  regurgitation. There is a 34 Mitral Memo 3D prosthetic annuloplasty ring  present in the mitral position. Procedure Date: 01/06/13.   5. The aortic valve is tricuspid. Aortic valve regurgitation is trivial.    02/18/23: TTE  1. Left ventricular ejection fraction, by estimation, is 60 to 65%. The  left ventricle has normal function. The left ventricle has no regional  wall motion abnormalities. There is moderate left ventricular hypertrophy.  Left ventricular diastolic  parameters are indeterminate.   2. Right ventricular systolic function is normal. The right ventricular  size is normal. There is normal pulmonary artery systolic pressure. The  estimated right ventricular systolic pressure is 22.9 mmHg.   3. Left atrial size was severely dilated.   4. Right atrial size was moderately dilated.   5. The mitral valve has been repaired/replaced. No evidence of mitral  valve regurgitation. Mild mitral stenosis. The mean mitral valve gradient  is 4.0 mmHg. There is a 32 mm prosthetic annuloplasty ring present in the  mitral position. Procedure Date:  01/06/2013. Echo findings are consistent with normal structure and function  of the mitral valve prosthesis.   6. The aortic valve is normal in structure. Aortic  valve regurgitation is  trivial. No aortic stenosis is present.   7. Aortic dilatation noted. There is mild dilatation of the aortic root,  measuring 43 mm.   8. The inferior vena cava is normal in size with  greater than 50%  respiratory variability, suggesting right atrial pressure of 3 mmHg.   Comparison(s): Prior images reviewed side by side.   Risk Assessment/Calculations:    Physical Exam:   VS:  There were no vitals taken for this visit.   Wt Readings from Last 3 Encounters:  08/09/24 190 lb (86.2 kg)  07/27/24 203 lb (92.1 kg)  06/23/24 199 lb (90.3 kg)    GEN: Well nourished, well developed in no acute distress NECK: No JVD; No carotid bruits CARDIAC: ***RRR, no murmurs, rubs, gallops RESPIRATORY:  *** CTA b/l without rales, wheezing or rhonchi  ABDOMEN: Soft, non-tender, non-distended EXTREMITIES: *** No edema; No deformity   ASSESSMENT AND PLAN: .    persistent AFib Typical and atypical AFlutters CHA2DS2Vasc is 4, on Eliquis , *** appropriately dosed *** burden by symptoms  VHD Remote MV reapir, functioning well by TEE Jan 21025 C/w Dr. Beverlee  NICM Presumed tachy/AF medicated *** C/we Dr. Beverlee  Secondary hypercoagulable state 2/2 AFib     {Are you ordering a CV Procedure (e.g. stress test, cath, DCCV, TEE, etc)?   Press F2        :[id number]}     Dispo: ***  Signed, Charlies Macario Arthur, PA-C

## 2024-09-09 ENCOUNTER — Ambulatory Visit: Attending: Physician Assistant | Admitting: Physician Assistant

## 2024-09-09 VITALS — BP 110/70 | HR 140 | Ht 71.0 in | Wt 204.0 lb

## 2024-09-09 DIAGNOSIS — Z9889 Other specified postprocedural states: Secondary | ICD-10-CM | POA: Diagnosis not present

## 2024-09-09 DIAGNOSIS — I484 Atypical atrial flutter: Secondary | ICD-10-CM

## 2024-09-09 DIAGNOSIS — I428 Other cardiomyopathies: Secondary | ICD-10-CM | POA: Diagnosis not present

## 2024-09-09 DIAGNOSIS — I4819 Other persistent atrial fibrillation: Secondary | ICD-10-CM | POA: Diagnosis not present

## 2024-09-09 MED ORDER — METOPROLOL SUCCINATE ER 25 MG PO TB24: 25.0000 mg | ORAL_TABLET | Freq: Every day | ORAL | 3 refills | Status: DC

## 2024-09-09 NOTE — Patient Instructions (Signed)
 Medication Instructions:   Your physician recommends that you continue on your current medications as directed. Please refer to the Current Medication list given to you today.   *If you need a refill on your cardiac medications before your next appointment, please call your pharmacy*   Lab Work: NONE ORDERED  TODAY    If you have labs (blood work) drawn today and your tests are completely normal, you will receive your results only by: MyChart Message (if you have MyChart) OR A paper copy in the mail If you have any lab test that is abnormal or we need to change your treatment, we will call you to review the results.    Testing/Procedures: NONE ORDERED  TODAY     Follow-Up: At [facility], you and your health needs are our priority.  As part of our continuing mission to provide you with exceptional heart care, our providers are all part of one team.  This team includes your primary Cardiologist (physician) and Advanced Practice Providers or APPs (Physician Assistants and Nurse Practitioners) who all work together to provide you with the care you need, when you need it.   Your next appointment:  AS SCHEDULED WITH AFIB CLINIC      We recommend signing up for the patient portal called MyChart.  Sign up information is provided on this After Visit Summary.  MyChart is used to connect with patients for Virtual Visits (Telemedicine).  Patients are able to view lab/test results, encounter notes, upcoming appointments, etc.  Non-urgent messages can be sent to your provider as well.   To learn more about what you can do with MyChart, go to [email or web address].   Other Instructions

## 2024-09-15 ENCOUNTER — Encounter (HOSPITAL_COMMUNITY): Payer: Self-pay | Admitting: Internal Medicine

## 2024-09-15 ENCOUNTER — Ambulatory Visit (HOSPITAL_COMMUNITY): Admission: RE | Admit: 2024-09-15 | Discharge: 2024-09-15 | Attending: Internal Medicine | Admitting: Internal Medicine

## 2024-09-15 VITALS — BP 114/92 | HR 136 | Ht 71.0 in | Wt 201.2 lb

## 2024-09-15 DIAGNOSIS — I4819 Other persistent atrial fibrillation: Secondary | ICD-10-CM

## 2024-09-15 DIAGNOSIS — I484 Atypical atrial flutter: Secondary | ICD-10-CM | POA: Diagnosis not present

## 2024-09-15 DIAGNOSIS — D6869 Other thrombophilia: Secondary | ICD-10-CM | POA: Diagnosis not present
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
## 2024-09-15 NOTE — Patient Instructions (Addendum)
 Cardioversion scheduled for: 09/17/24 Friday 7:00 am    - Arrive at the [employer] [facility] of [facility] ([address])  and check in with ADMITTING at 7:00 am    - Do not eat or drink anything after midnight the night prior to your procedure.   - Take all your morning medication (except diabetic medications) with a sip of water prior to arrival.  - Do NOT miss any doses of your blood thinner - if you should miss a dose or take a dose more than 4 hours late -- please notify our office immediately.  - You will not be able to drive home after your procedure. Please ensure you have a responsible adult to drive you home. You will need someone with you for 24 hours post procedure.     - Expect to be in the procedural area approximately 2 hours.   - If you feel as if you go back into normal rhythm prior to scheduled cardioversion, please notify our office immediately.   If your procedure is canceled in the cardioversion suite you will be charged a cancellation fee.
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
## 2024-09-16 ENCOUNTER — Ambulatory Visit (HOSPITAL_COMMUNITY): Payer: Self-pay | Admitting: Internal Medicine

## 2024-09-16 LAB — BASIC METABOLIC PANEL WITH GFR
BUN/Creatinine Ratio: 14 (ref 10–24)
BUN: 15 mg/dL (ref 8–27)
CO2: 22 mmol/L (ref 20–29)
Calcium: 9.5 mg/dL (ref 8.6–10.2)
Chloride: 100 mmol/L (ref 96–106)
Creatinine, Ser: 1.06 mg/dL (ref 0.76–1.27)
Glucose: 94 mg/dL (ref 70–99)
Potassium: 4.3 mmol/L (ref 3.5–5.2)
Sodium: 137 mmol/L (ref 134–144)
eGFR: 72 mL/min/1.73 (ref >59)

## 2024-09-16 LAB — CBC
Hematocrit: 38.8 % (ref 37.5–51.0)
Hemoglobin: 13.1 g/dL (ref 13.0–17.7)
MCH: 34.7 pg — ABNORMAL HIGH (ref 26.6–33.0)
MCHC: 33.8 g/dL (ref 31.5–35.7)
MCV: 103 fL — ABNORMAL HIGH (ref 79–97)
Platelets: 158 x10E3/uL (ref 150–450)
RBC: 3.78 x10E6/uL — ABNORMAL LOW (ref 4.14–5.80)
RDW: 12 % (ref 11.6–15.4)
WBC: 5 x10E3/uL (ref 3.4–10.8)

## 2024-09-16 NOTE — Anesthesia Preprocedure Evaluation (Signed)
 Anesthesia Evaluation    Reviewed: Allergy & Precautions, H&P , Patient's Chart, lab work & pertinent test results  Airway        Dental   Pulmonary neg pulmonary ROS          Cardiovascular Exercise Tolerance: Good hypertension, Pt. on medications and Pt. on home beta blockers negative cardio ROS + dysrhythmias Atrial Fibrillation      Neuro/Psych negative neurological ROS  negative psych ROS   GI/Hepatic negative GI ROS, Neg liver ROS,GERD  Medicated,,  Endo/Other  negative endocrine ROS    Renal/GU negative Renal ROS  negative genitourinary   Musculoskeletal   Abdominal   Peds  Hematology negative hematology ROS (+)   Anesthesia Other Findings   Reproductive/Obstetrics negative OB ROS                              Anesthesia Physical Anesthesia Plan  ASA: 3  Anesthesia Plan: General   Post-op Pain Management: Minimal or no pain anticipated   Induction: Intravenous  PONV Risk Score and Plan: 2 and Propofol  infusion  Airway Management Planned: Natural Airway and Nasal Cannula  Additional Equipment:   Intra-op Plan:   Post-operative Plan:   Informed Consent: I have reviewed the patients History and Physical, chart, labs and discussed the procedure including the risks, benefits and alternatives for the proposed anesthesia with the patient or authorized representative who has indicated his/her understanding and acceptance.       Plan Discussed with:   Anesthesia Plan Comments:         Anesthesia Quick Evaluation

## 2024-09-17 ENCOUNTER — Ambulatory Visit (HOSPITAL_COMMUNITY): Payer: Self-pay | Admitting: Anesthesiology

## 2024-09-17 ENCOUNTER — Other Ambulatory Visit: Payer: Self-pay

## 2024-09-17 ENCOUNTER — Ambulatory Visit (HOSPITAL_COMMUNITY)
Admission: RE | Admit: 2024-09-17 | Discharge: 2024-09-17 | Disposition: A | Discharge status: Home / Self Care | Attending: Internal Medicine | Admitting: Internal Medicine

## 2024-09-17 ENCOUNTER — Encounter (HOSPITAL_COMMUNITY): Admission: RE | Disposition: A | Payer: Self-pay | Source: Home / Self Care | Attending: Internal Medicine

## 2024-09-17 DIAGNOSIS — I10 Essential (primary) hypertension: Secondary | ICD-10-CM | POA: Diagnosis not present

## 2024-09-17 DIAGNOSIS — I4819 Other persistent atrial fibrillation: Secondary | ICD-10-CM | POA: Insufficient documentation

## 2024-09-17 DIAGNOSIS — K219 Gastro-esophageal reflux disease without esophagitis: Secondary | ICD-10-CM | POA: Diagnosis not present

## 2024-09-17 DIAGNOSIS — D6869 Other thrombophilia: Secondary | ICD-10-CM | POA: Diagnosis not present

## 2024-09-17 DIAGNOSIS — E785 Hyperlipidemia, unspecified: Secondary | ICD-10-CM | POA: Diagnosis not present

## 2024-09-17 DIAGNOSIS — Z7901 Long term (current) use of anticoagulants: Secondary | ICD-10-CM | POA: Insufficient documentation

## 2024-09-17 DIAGNOSIS — I4892 Unspecified atrial flutter: Secondary | ICD-10-CM | POA: Insufficient documentation

## 2024-09-17 HISTORY — PX: CARDIOVERSION: EP1203

## 2024-09-17 SURGERY — CARDIOVERSION (CATH LAB)
Anesthesia: General

## 2024-09-17 MED ORDER — PROPOFOL 10 MG/ML IV BOLUS
INTRAVENOUS | Status: DC | PRN
  Administered 2024-09-17: 30 mg via INTRAVENOUS
  Administered 2024-09-17: 50 mg via INTRAVENOUS

## 2024-09-17 MED ORDER — LIDOCAINE 2% (20 MG/ML) 5 ML SYRINGE
INTRAMUSCULAR | Status: DC | PRN
  Administered 2024-09-17: 60 mg via INTRAVENOUS

## 2024-09-17 MED ORDER — SODIUM CHLORIDE 0.9 % IV SOLN: INTRAVENOUS | Status: DC

## 2024-09-17 SURGICAL SUPPLY — 1 items: PAD DEFIB RADIO PHYSIO CONN (PAD) ×1 IMPLANT

## 2024-09-17 NOTE — Interval H&P Note (Signed)
 History and Physical Interval Note:  09/17/2024 7:45 AM  [name]  has presented today for surgery, with the diagnosis of AFIB.  The various methods of treatment have been discussed with the patient and family. After consideration of risks, benefits and other options for treatment, the patient has consented to  Procedures: CARDIOVERSION (N/A) as a surgical intervention.  The patient's history has been reviewed, patient examined, no change in status, stable for surgery.  I have reviewed the patient's chart and labs.  Questions were answered to the patient's satisfaction.     James [name]

## 2024-09-17 NOTE — CV Procedure (Signed)
" ° ° °  CARDIOVERSION NOTE  Procedure: Electrical Cardioversion Indications:  Atrial Fibrillation  Procedure Details:  Consent: Risks of procedure as well as the alternatives and risks of each were explained to the (patient/caregiver).  Consent for procedure obtained.  Time Out: Verified patient identification, verified procedure, site/side was marked, verified correct patient position, special equipment/implants available, medications/allergies/relevent history reviewed, required imaging and test results available.  Performed  Patient placed on cardiac monitor, pulse oximetry, supplemental oxygen as necessary.  Sedation given: per anesthesia Pacer pads placed anterior and posterior chest.  Cardioverted 1 time(s).  Cardioverted at 300Jbiphasic.  Impression: Findings: Post procedure EKG shows: NSR Complications: None Patient did tolerate procedure well.  Plan: Successful DCCV with a single 300J biphasic shock to  NSR.  Time Spent Directly with the Patient:  30 minutes   Vinie KYM Maxcy, MD, [facility], FNLA, FACP  [place]  [facility]  Medical Director of the [facility]id [facility] &  Cardiovascular Risk Reduction Clinic Diplomate of the American Board of Clinical Lipidology Attending Cardiologist  Direct Dial: [phone]  Fax: [phone]  Website:  [email or web address] Vinie BROCKS Jobeth Pangilinan 09/17/2024, 8:08 AM     "

## 2024-09-17 NOTE — Transfer of Care (Signed)
 Immediate Anesthesia Transfer of Care Note  Patient: James [name]  Procedure(s) Performed: CARDIOVERSION  Patient Location: Cath Lab  Anesthesia Type:General  Level of Consciousness: drowsy and responds to stimulation  Airway & Oxygen Therapy: Patient Spontanous Breathing and Patient connected to nasal cannula oxygen  Post-op Assessment: Report given to RN and Post -op Vital signs reviewed and stable  Post vital signs: Reviewed and stable  Last Vitals:  Vitals Value Taken Time  BP 99/73 (83)   Temp    Pulse 67   Resp 15   SpO2 94 %   Vitals shown include unfiled device data.  Last Pain:  Vitals:   09/17/24 0658  TempSrc: Temporal  PainSc: 0-No pain         Complications: There were no known notable events for this encounter.

## 2024-09-17 NOTE — Anesthesia Postprocedure Evaluation (Signed)
"   Anesthesia Post Note  Patient: James [name]  Procedure(s) Performed: CARDIOVERSION     Patient location during evaluation: Cath Lab Anesthesia Type: General Level of consciousness: awake and alert Pain management: pain level controlled Vital Signs Assessment: post-procedure vital signs reviewed and stable Respiratory status: spontaneous breathing, nonlabored ventilation and respiratory function stable Cardiovascular status: blood pressure returned to baseline and stable Postop Assessment: no apparent nausea or vomiting Anesthetic complications: no   There were no known notable events for this encounter.  Last Vitals:  Vitals:   09/17/24 0830 09/17/24 0835  BP: 105/69 108/73  Pulse: 66 66  Resp: 20 17  Temp:    SpO2: 94% 95%    Last Pain:  Vitals:   09/17/24 0810  TempSrc:   PainSc: 0-No pain                 Irvin Lizama,W. EDMOND      "

## 2024-09-19 ENCOUNTER — Encounter (HOSPITAL_COMMUNITY): Payer: Self-pay | Admitting: Internal Medicine

## 2024-09-29 ENCOUNTER — Ambulatory Visit (HOSPITAL_COMMUNITY): Admitting: Internal Medicine

## 2024-10-06 ENCOUNTER — Ambulatory Visit (HOSPITAL_COMMUNITY)
Admission: RE | Admit: 2024-10-06 | Discharge: 2024-10-06 | Disposition: A | Discharge status: Home / Self Care | Source: Ambulatory Visit | Attending: Internal Medicine | Admitting: Internal Medicine

## 2024-10-06 ENCOUNTER — Encounter (HOSPITAL_COMMUNITY): Payer: Self-pay | Admitting: Internal Medicine

## 2024-10-06 VITALS — BP 104/78 | HR 135 | Ht 71.0 in | Wt 204.7 lb

## 2024-10-06 DIAGNOSIS — D6869 Other thrombophilia: Secondary | ICD-10-CM

## 2024-10-06 DIAGNOSIS — I4819 Other persistent atrial fibrillation: Secondary | ICD-10-CM

## 2024-10-06 DIAGNOSIS — I484 Atypical atrial flutter: Secondary | ICD-10-CM

## 2024-10-06 MED ORDER — DRONEDARONE HCL 400 MG PO TABS: 400.0000 mg | ORAL_TABLET | Freq: Two times a day (BID) | ORAL | 6 refills | Status: DC

## 2024-10-06 NOTE — Patient Instructions (Addendum)
 Start dronedarone  (MULTAQ ) 400 MG twice a day with food  Cardioversion scheduled for: 10/12/24 Tuesday 6:30 am    - Arrive at the [employer] [facility] ([address]  and check in with ADMITTING at   - Do not eat or drink anything after midnight the night prior to your procedure.   - Take all your morning medication (except diabetic medications) with a sip of water prior to arrival.  - Do NOT miss any doses of your blood thinner - if you should miss a dose or take a dose more than 4 hours late -- please notify our office immediately.  - You will not be able to drive home after your procedure. Please ensure you have a responsible adult to drive you home. You will need someone with you for 24 hours post procedure.     - Expect to be in the procedural area approximately 2 hours.   - If you feel as if you go back into normal rhythm prior to scheduled cardioversion, please notify our office immediately.   If your procedure is canceled in the cardioversion suite you will be charged a cancellation fee.
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
## 2024-10-11 NOTE — Progress Notes (Signed)
 Patient Called with the Following Pre-Procedure instructions:   Time of Arrival for Procedure: 0700  NPO at midnight prior to procedure day besides sips of water with morning medications  Patient must have a ride home and someone to stay with them for 24 hours post procedure  Instructed to take following medications morning prior to procedure: Eliquis 

## 2024-10-12 ENCOUNTER — Ambulatory Visit (HOSPITAL_COMMUNITY)
Admission: RE | Admit: 2024-10-12 | Discharge: 2024-10-12 | Disposition: A | Attending: Cardiovascular Disease | Admitting: Cardiovascular Disease

## 2024-10-12 ENCOUNTER — Encounter (HOSPITAL_COMMUNITY): Payer: Self-pay | Admitting: Cardiovascular Disease

## 2024-10-12 ENCOUNTER — Ambulatory Visit (HOSPITAL_COMMUNITY): Admitting: Anesthesiology

## 2024-10-12 ENCOUNTER — Other Ambulatory Visit: Payer: Self-pay

## 2024-10-12 ENCOUNTER — Encounter (HOSPITAL_COMMUNITY)
Admission: RE | Disposition: A | Discharge status: Home / Self Care | Payer: Self-pay | Source: Home / Self Care | Attending: Cardiovascular Disease

## 2024-10-12 DIAGNOSIS — Z79899 Other long term (current) drug therapy: Secondary | ICD-10-CM | POA: Insufficient documentation

## 2024-10-12 DIAGNOSIS — I10 Essential (primary) hypertension: Secondary | ICD-10-CM | POA: Insufficient documentation

## 2024-10-12 DIAGNOSIS — K219 Gastro-esophageal reflux disease without esophagitis: Secondary | ICD-10-CM | POA: Insufficient documentation

## 2024-10-12 DIAGNOSIS — I4891 Unspecified atrial fibrillation: Secondary | ICD-10-CM

## 2024-10-12 DIAGNOSIS — I340 Nonrheumatic mitral (valve) insufficiency: Secondary | ICD-10-CM | POA: Insufficient documentation

## 2024-10-12 DIAGNOSIS — D6869 Other thrombophilia: Secondary | ICD-10-CM | POA: Insufficient documentation

## 2024-10-12 DIAGNOSIS — Z7901 Long term (current) use of anticoagulants: Secondary | ICD-10-CM | POA: Insufficient documentation

## 2024-10-12 DIAGNOSIS — I4892 Unspecified atrial flutter: Secondary | ICD-10-CM | POA: Insufficient documentation

## 2024-10-12 DIAGNOSIS — I4819 Other persistent atrial fibrillation: Secondary | ICD-10-CM | POA: Diagnosis present

## 2024-10-12 HISTORY — PX: CARDIOVERSION: EP1203

## 2024-10-12 MED ORDER — PHENYLEPHRINE HCL (PRESSORS) 10 MG/ML IV SOLN
INTRAVENOUS | Status: DC | PRN
  Administered 2024-10-12 (×2): 80 ug via INTRAVENOUS

## 2024-10-12 MED ORDER — CALCIUM CHLORIDE 10 % IV SOLN
INTRAVENOUS | Status: AC
  Filled 2024-10-12: qty 10

## 2024-10-12 MED ORDER — LIDOCAINE 2% (20 MG/ML) 5 ML SYRINGE
INTRAMUSCULAR | Status: DC | PRN
  Administered 2024-10-12: 40 mg via INTRAVENOUS

## 2024-10-12 MED ORDER — CALCIUM GLUCONATE-NACL 1-0.675 GM/50ML-% IV SOLN
1.0000 g | Freq: Once | INTRAVENOUS | Status: DC
  Filled 2024-10-12: qty 50

## 2024-10-12 MED ORDER — EPHEDRINE SULFATE (PRESSORS) 25 MG/5ML IV SOSY
PREFILLED_SYRINGE | INTRAVENOUS | Status: DC | PRN
  Administered 2024-10-12: 10 mg via INTRAVENOUS

## 2024-10-12 MED ORDER — PROPOFOL 10 MG/ML IV BOLUS
INTRAVENOUS | Status: DC | PRN
  Administered 2024-10-12: 60 mg via INTRAVENOUS

## 2024-10-12 MED ORDER — SODIUM CHLORIDE 0.9 % IV SOLN: INTRAVENOUS | Status: DC

## 2024-10-12 NOTE — Interval H&P Note (Signed)
 History and Physical Interval Note:  10/12/2024 8:17 AM  James [name]  has presented today for surgery, with the diagnosis of AFIB.  The various methods of treatment have been discussed with the patient and family. After consideration of risks, benefits and other options for treatment, the patient has consented to  Procedures: CARDIOVERSION (N/A) as a surgical intervention.  The patient's history has been reviewed, patient examined, no change in status, stable for surgery.  I have reviewed the patient's chart and labs.  Questions were answered to the patient's satisfaction.     Annabella Scarce, MD

## 2024-10-12 NOTE — Transfer of Care (Signed)
 Immediate Anesthesia Transfer of Care Note  Patient: James [name]  Procedure(s) Performed: CARDIOVERSION  Patient Location: Cath Lab  Anesthesia Type:General  Level of Consciousness: awake, alert , and oriented  Airway & Oxygen Therapy: Patient Spontanous Breathing and Patient connected to nasal cannula oxygen  Post-op Assessment: Report given to RN and Post -op Vital signs reviewed and stable  Post vital signs: Reviewed and stable  Last Vitals:  Vitals Value Taken Time  BP 96/56 10/12/24 08:33  Temp 36 0840  Pulse 38 10/12/24 08:33  Resp 21 10/12/24 08:33  SpO2 96 % 10/12/24 08:33    Last Pain:  Vitals:   10/12/24 0833  TempSrc:   PainSc: 0-No pain         Complications: No notable events documented.

## 2024-10-12 NOTE — Anesthesia Preprocedure Evaluation (Addendum)
"                                    Anesthesia Evaluation  Patient identified by MRN, date of birth, ID band Patient awake    Reviewed: Allergy & Precautions, NPO status , Patient's Chart, lab work & pertinent test results  Airway Mallampati: III       Dental  (+) Teeth Intact, Dental Advisory Given   Pulmonary neg pulmonary ROS    + decreased breath sounds      Cardiovascular hypertension, Pt. on home beta blockers and Pt. on medications + dysrhythmias Atrial Fibrillation  Rhythm:Irregular Rate:Abnormal  Echo:  1. Left ventricular ejection fraction, by estimation, is 40 to 45%. The  left ventricle has mildly decreased function.   2. Right ventricular systolic function is mildly reduced. The right  ventricular size is normal.   3. Left atrial size was severely dilated. No left atrial/left atrial  appendage thrombus was detected.   4. The mitral valve has been repaired/replaced. Trivial mitral valve  regurgitation. There is a 34 Mitral Memo 3D prosthetic annuloplasty ring  present in the mitral position. Procedure Date: 01/06/13.   5. The aortic valve is tricuspid. Aortic valve regurgitation is trivial.   - s/p MV Repair    Neuro/Psych negative neurological ROS  negative psych ROS   GI/Hepatic Neg liver ROS,GERD  Medicated,,  Endo/Other  negative endocrine ROS    Renal/GU negative Renal ROS     Musculoskeletal negative musculoskeletal ROS (+)    Abdominal   Peds  Hematology negative hematology ROS (+)   Anesthesia Other Findings   Reproductive/Obstetrics                              Anesthesia Physical Anesthesia Plan  ASA: 3  Anesthesia Plan: General   Post-op Pain Management: Minimal or no pain anticipated   Induction: Intravenous  PONV Risk Score and Plan: 0  Airway Management Planned: Natural Airway and Mask  Additional Equipment: None  Intra-op Plan:   Post-operative Plan:   Informed Consent: I  have reviewed the patients History and Physical, chart, labs and discussed the procedure including the risks, benefits and alternatives for the proposed anesthesia with the patient or authorized representative who has indicated his/her understanding and acceptance.       Plan Discussed with: [name]  Anesthesia Plan Comments:          Anesthesia Quick Evaluation  "

## 2024-10-12 NOTE — CV Procedure (Signed)
 Electrical Cardioversion Procedure Note Caesar Mannella [id number] [birth year]  Procedure: Electrical Cardioversion Indications:  Atrial Fibrillation  Procedure Details Consent: Risks of procedure as well as the alternatives and risks of each were explained to the (patient/caregiver).  Consent for procedure obtained. Time Out: Verified patient identification, verified procedure, site/side was marked, verified correct patient position, special equipment/implants available, medications/allergies/relevent history reviewed, required imaging and test results available.  Performed  Patient placed on cardiac monitor, pulse oximetry, supplemental oxygen as necessary.  Sedation given: propofol  Pacer pads placed anterior and posterior chest.  Cardioverted 1 time(s).  Cardioverted at 200J.  Evaluation Findings: Post procedure EKG shows: Long pause followed by sinus bradycardia Complications: None Patient did tolerate procedure well.   Annabella Scarce, MD 10/12/2024, 8:28 AM

## 2024-10-12 NOTE — Anesthesia Postprocedure Evaluation (Signed)
"   Anesthesia Post Note  Patient: James [name]  Procedure(s) Performed: CARDIOVERSION     Patient location during evaluation: PACU Anesthesia Type: General Level of consciousness: awake and alert Pain management: pain level controlled Vital Signs Assessment: post-procedure vital signs reviewed and stable Respiratory status: spontaneous breathing, nonlabored ventilation, respiratory function stable and patient connected to nasal cannula oxygen Cardiovascular status: blood pressure returned to baseline and stable Postop Assessment: no apparent nausea or vomiting Anesthetic complications: no   There were no known notable events for this encounter.  Last Vitals:  Vitals:   10/12/24 0840 10/12/24 0921  BP: (!) 96/56 111/63  Pulse: (!) 48 (!) 59  Resp: (!) 21 20  Temp:    SpO2: 98% 96%    Last Pain:  Vitals:   10/12/24 0840  TempSrc:   PainSc: 0-No pain                 [name]      "

## 2024-10-26 ENCOUNTER — Ambulatory Visit (HOSPITAL_COMMUNITY): Admitting: Internal Medicine

## 2024-11-04 ENCOUNTER — Ambulatory Visit (HOSPITAL_COMMUNITY)
Admission: RE | Admit: 2024-11-04 | Discharge: 2024-11-04 | Disposition: A | Source: Ambulatory Visit | Attending: Internal Medicine | Admitting: Internal Medicine

## 2024-11-04 ENCOUNTER — Encounter (HOSPITAL_COMMUNITY): Payer: Self-pay | Admitting: Internal Medicine

## 2024-11-04 VITALS — BP 102/70 | HR 149 | Ht 71.0 in | Wt 201.4 lb

## 2024-11-04 DIAGNOSIS — I484 Atypical atrial flutter: Secondary | ICD-10-CM | POA: Diagnosis not present

## 2024-11-04 DIAGNOSIS — D6869 Other thrombophilia: Secondary | ICD-10-CM

## 2024-11-04 DIAGNOSIS — I4819 Other persistent atrial fibrillation: Secondary | ICD-10-CM

## 2024-11-04 MED ORDER — AMIODARONE HCL 200 MG PO TABS: ORAL_TABLET | ORAL | 3 refills | Status: AC

## 2024-11-04 NOTE — Patient Instructions (Addendum)
 Hold  telmisartan  /MICARDIS  if SBP IS LESS THAN 100  Stop Multaq     Start Amiodarone  200 mg twice a day for 30 days then decrease to 200 mg once a day
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
## 2024-11-25 ENCOUNTER — Ambulatory Visit (HOSPITAL_COMMUNITY): Admitting: Internal Medicine
